# Patient Record
Sex: Female | Born: 1935 | Race: White | Hispanic: No | Smoking: Never smoker
Health system: Southern US, Community
[De-identification: ages and names within clinical notes are randomized; demographics above are authoritative.]

## PROBLEM LIST (undated history)

## (undated) DIAGNOSIS — M1990 Unspecified osteoarthritis, unspecified site: Secondary | ICD-10-CM

## (undated) DIAGNOSIS — G4730 Sleep apnea, unspecified: Secondary | ICD-10-CM

## (undated) DIAGNOSIS — E119 Type 2 diabetes mellitus without complications: Secondary | ICD-10-CM

## (undated) DIAGNOSIS — I10 Essential (primary) hypertension: Secondary | ICD-10-CM

## (undated) HISTORY — PX: ABDOMINAL HYSTERECTOMY: SHX81

## (undated) HISTORY — PX: DILATION AND CURETTAGE OF UTERUS: SHX78

## (undated) HISTORY — PX: MASTECTOMY: SHX3

## (undated) HISTORY — PX: CHOLECYSTECTOMY: SHX55

## (undated) HISTORY — PX: REPLACEMENT TOTAL KNEE BILATERAL: SUR1225

## (undated) HISTORY — PX: APPENDECTOMY: SHX54

## (undated) HISTORY — PX: CARDIAC CATHETERIZATION: SHX172

---

## 1998-10-07 ENCOUNTER — Other Ambulatory Visit: Admission: RE | Admit: 1998-10-07 | Discharge: 1998-10-07 | Payer: Self-pay | Admitting: Family Medicine

## 2004-02-06 ENCOUNTER — Other Ambulatory Visit: Payer: Self-pay

## 2004-02-06 ENCOUNTER — Inpatient Hospital Stay: Payer: Self-pay | Admitting: Internal Medicine

## 2004-02-27 ENCOUNTER — Inpatient Hospital Stay: Payer: Self-pay | Admitting: Internal Medicine

## 2004-03-14 ENCOUNTER — Ambulatory Visit: Payer: Self-pay | Admitting: Internal Medicine

## 2005-04-13 ENCOUNTER — Other Ambulatory Visit: Payer: Self-pay

## 2005-04-13 ENCOUNTER — Emergency Department: Payer: Self-pay | Admitting: Emergency Medicine

## 2005-04-29 ENCOUNTER — Ambulatory Visit: Payer: Self-pay | Admitting: Internal Medicine

## 2005-05-02 ENCOUNTER — Emergency Department: Payer: Self-pay | Admitting: Emergency Medicine

## 2005-06-29 ENCOUNTER — Emergency Department: Payer: Self-pay | Admitting: Internal Medicine

## 2005-11-11 ENCOUNTER — Ambulatory Visit: Payer: Self-pay | Admitting: Family

## 2006-05-11 ENCOUNTER — Ambulatory Visit: Payer: Self-pay | Admitting: Neurology

## 2006-10-12 DIAGNOSIS — I10 Essential (primary) hypertension: Secondary | ICD-10-CM | POA: Insufficient documentation

## 2006-10-18 ENCOUNTER — Ambulatory Visit: Payer: Self-pay | Admitting: Gastroenterology

## 2006-12-22 ENCOUNTER — Ambulatory Visit: Payer: Self-pay | Admitting: Unknown Physician Specialty

## 2008-01-09 ENCOUNTER — Ambulatory Visit: Payer: Self-pay | Admitting: Gastroenterology

## 2008-01-11 ENCOUNTER — Other Ambulatory Visit: Payer: Self-pay

## 2008-01-11 ENCOUNTER — Emergency Department: Payer: Self-pay | Admitting: Emergency Medicine

## 2008-03-19 ENCOUNTER — Ambulatory Visit: Payer: Self-pay | Admitting: Internal Medicine

## 2008-05-17 ENCOUNTER — Ambulatory Visit: Payer: Self-pay | Admitting: Cardiovascular Disease

## 2009-05-21 ENCOUNTER — Ambulatory Visit: Payer: Self-pay | Admitting: Internal Medicine

## 2010-05-26 ENCOUNTER — Ambulatory Visit: Payer: Self-pay | Admitting: Internal Medicine

## 2011-05-27 ENCOUNTER — Ambulatory Visit: Payer: Self-pay | Admitting: Internal Medicine

## 2011-06-24 ENCOUNTER — Ambulatory Visit: Payer: Self-pay | Admitting: Internal Medicine

## 2012-08-31 DIAGNOSIS — M1990 Unspecified osteoarthritis, unspecified site: Secondary | ICD-10-CM | POA: Insufficient documentation

## 2012-08-31 DIAGNOSIS — M797 Fibromyalgia: Secondary | ICD-10-CM | POA: Insufficient documentation

## 2012-09-30 DIAGNOSIS — F1120 Opioid dependence, uncomplicated: Secondary | ICD-10-CM | POA: Insufficient documentation

## 2012-09-30 DIAGNOSIS — M25512 Pain in left shoulder: Secondary | ICD-10-CM | POA: Insufficient documentation

## 2012-09-30 DIAGNOSIS — M7550 Bursitis of unspecified shoulder: Secondary | ICD-10-CM | POA: Insufficient documentation

## 2012-09-30 DIAGNOSIS — I10 Essential (primary) hypertension: Secondary | ICD-10-CM | POA: Insufficient documentation

## 2012-10-18 ENCOUNTER — Ambulatory Visit: Payer: Self-pay | Admitting: Internal Medicine

## 2012-12-30 ENCOUNTER — Emergency Department: Payer: Self-pay | Admitting: Emergency Medicine

## 2012-12-30 LAB — COMPREHENSIVE METABOLIC PANEL
Alkaline Phosphatase: 62 U/L (ref 50–136)
Anion Gap: 4 — ABNORMAL LOW (ref 7–16)
BUN: 15 mg/dL (ref 7–18)
Bilirubin,Total: 0.4 mg/dL (ref 0.2–1.0)
Calcium, Total: 9.3 mg/dL (ref 8.5–10.1)
Chloride: 101 mmol/L (ref 98–107)
Co2: 31 mmol/L (ref 21–32)
Glucose: 88 mg/dL (ref 65–99)
SGOT(AST): 31 U/L (ref 15–37)
Sodium: 136 mmol/L (ref 136–145)
Total Protein: 7.4 g/dL (ref 6.4–8.2)

## 2012-12-30 LAB — CBC
HCT: 35.8 % (ref 35.0–47.0)
MCH: 31.7 pg (ref 26.0–34.0)
MCV: 92 fL (ref 80–100)
Platelet: 354 10*3/uL (ref 150–440)
RDW: 13.4 % (ref 11.5–14.5)
WBC: 9.5 10*3/uL (ref 3.6–11.0)

## 2012-12-30 LAB — URINALYSIS, COMPLETE
Bilirubin,UR: NEGATIVE
Glucose,UR: NEGATIVE mg/dL (ref 0–75)
Ketone: NEGATIVE
Ph: 7 (ref 4.5–8.0)
Protein: NEGATIVE

## 2012-12-30 LAB — TROPONIN I: Troponin-I: <0.02 ng/mL

## 2013-02-15 DIAGNOSIS — R6884 Jaw pain: Secondary | ICD-10-CM | POA: Insufficient documentation

## 2013-02-15 DIAGNOSIS — M7550 Bursitis of unspecified shoulder: Secondary | ICD-10-CM | POA: Insufficient documentation

## 2013-05-23 DIAGNOSIS — I2510 Atherosclerotic heart disease of native coronary artery without angina pectoris: Secondary | ICD-10-CM | POA: Diagnosis present

## 2014-04-06 ENCOUNTER — Ambulatory Visit: Payer: Self-pay | Admitting: Nurse Practitioner

## 2014-04-10 ENCOUNTER — Ambulatory Visit: Payer: Self-pay | Admitting: Nurse Practitioner

## 2014-06-02 ENCOUNTER — Emergency Department: Payer: Self-pay | Admitting: Emergency Medicine

## 2014-06-02 LAB — CBC WITH DIFFERENTIAL/PLATELET
BASOS ABS: 0.1 10*3/uL (ref 0.0–0.1)
Basophil %: 1 %
EOS ABS: 0.3 10*3/uL (ref 0.0–0.7)
Eosinophil %: 4 %
HCT: 38.2 % (ref 35.0–47.0)
HGB: 12.5 g/dL (ref 12.0–16.0)
LYMPHS ABS: 2.8 10*3/uL (ref 1.0–3.6)
Lymphocyte %: 34.1 %
MCH: 30 pg (ref 26.0–34.0)
MCHC: 32.8 g/dL (ref 32.0–36.0)
MCV: 91 fL (ref 80–100)
MONOS PCT: 6.4 %
Monocyte #: 0.5 x10 3/mm (ref 0.2–0.9)
Neutrophil #: 4.4 10*3/uL (ref 1.4–6.5)
Neutrophil %: 54.5 %
PLATELETS: 352 10*3/uL (ref 150–440)
RBC: 4.18 10*6/uL (ref 3.80–5.20)
RDW: 13.1 % (ref 11.5–14.5)
WBC: 8.1 10*3/uL (ref 3.6–11.0)

## 2014-06-02 LAB — URINALYSIS, COMPLETE
Bilirubin,UR: NEGATIVE
Blood: NEGATIVE
Glucose,UR: NEGATIVE mg/dL (ref 0–75)
KETONE: NEGATIVE
LEUKOCYTE ESTERASE: NEGATIVE
Nitrite: NEGATIVE
PH: 7 (ref 4.5–8.0)
Protein: NEGATIVE
RBC,UR: NONE SEEN /HPF (ref 0–5)
SPECIFIC GRAVITY: 1.015 (ref 1.003–1.030)
WBC UR: NONE SEEN /HPF (ref 0–5)

## 2014-06-02 LAB — HEPATIC FUNCTION PANEL A (ARMC)
ALBUMIN: 3.7 g/dL (ref 3.4–5.0)
ALK PHOS: 47 U/L (ref 46–116)
AST: 41 U/L — AB (ref 15–37)
Bilirubin, Direct: <0.1 mg/dL (ref 0.0–0.2)
Bilirubin,Total: 0.5 mg/dL (ref 0.2–1.0)
SGPT (ALT): 28 U/L (ref 14–63)
Total Protein: 7.4 g/dL (ref 6.4–8.2)

## 2014-06-02 LAB — BASIC METABOLIC PANEL
ANION GAP: 10 (ref 7–16)
BUN: 17 mg/dL (ref 7–18)
CALCIUM: 8.8 mg/dL (ref 8.5–10.1)
CO2: 27 mmol/L (ref 21–32)
Chloride: 103 mmol/L (ref 98–107)
Creatinine: 1.12 mg/dL (ref 0.60–1.30)
EGFR (African American): >60
EGFR (Non-African Amer.): 50 — ABNORMAL LOW
Glucose: 97 mg/dL (ref 65–99)
Osmolality: 281 (ref 275–301)
Potassium: 4.1 mmol/L (ref 3.5–5.1)
Sodium: 140 mmol/L (ref 136–145)

## 2014-06-02 LAB — TROPONIN I

## 2014-06-03 LAB — CULTURE, BLOOD (SINGLE)

## 2014-06-04 LAB — URINE CULTURE

## 2014-08-14 ENCOUNTER — Ambulatory Visit
Admit: 2014-08-14 | Disposition: A | Discharge status: Home / Self Care | Payer: Self-pay | Attending: Gastroenterology | Admitting: Gastroenterology

## 2015-03-16 ENCOUNTER — Encounter: Payer: Self-pay | Admitting: Emergency Medicine

## 2015-03-16 ENCOUNTER — Inpatient Hospital Stay
Admission: EM | Admit: 2015-03-16 | Discharge: 2015-03-18 | DRG: 871 | Disposition: A | Discharge status: Home / Self Care | Payer: Medicare Other | Attending: Internal Medicine | Admitting: Internal Medicine

## 2015-03-16 ENCOUNTER — Emergency Department: Payer: Medicare Other

## 2015-03-16 DIAGNOSIS — Z91041 Radiographic dye allergy status: Secondary | ICD-10-CM

## 2015-03-16 DIAGNOSIS — N182 Chronic kidney disease, stage 2 (mild): Secondary | ICD-10-CM | POA: Diagnosis present

## 2015-03-16 DIAGNOSIS — Z7984 Long term (current) use of oral hypoglycemic drugs: Secondary | ICD-10-CM

## 2015-03-16 DIAGNOSIS — M1990 Unspecified osteoarthritis, unspecified site: Secondary | ICD-10-CM | POA: Diagnosis present

## 2015-03-16 DIAGNOSIS — J690 Pneumonitis due to inhalation of food and vomit: Secondary | ICD-10-CM | POA: Diagnosis present

## 2015-03-16 DIAGNOSIS — Z79891 Long term (current) use of opiate analgesic: Secondary | ICD-10-CM | POA: Diagnosis not present

## 2015-03-16 DIAGNOSIS — Z882 Allergy status to sulfonamides status: Secondary | ICD-10-CM | POA: Diagnosis not present

## 2015-03-16 DIAGNOSIS — M069 Rheumatoid arthritis, unspecified: Secondary | ICD-10-CM | POA: Diagnosis present

## 2015-03-16 DIAGNOSIS — Z79899 Other long term (current) drug therapy: Secondary | ICD-10-CM

## 2015-03-16 DIAGNOSIS — R109 Unspecified abdominal pain: Secondary | ICD-10-CM

## 2015-03-16 DIAGNOSIS — E785 Hyperlipidemia, unspecified: Secondary | ICD-10-CM | POA: Diagnosis present

## 2015-03-16 DIAGNOSIS — I129 Hypertensive chronic kidney disease with stage 1 through stage 4 chronic kidney disease, or unspecified chronic kidney disease: Secondary | ICD-10-CM | POA: Diagnosis present

## 2015-03-16 DIAGNOSIS — K219 Gastro-esophageal reflux disease without esophagitis: Secondary | ICD-10-CM | POA: Diagnosis present

## 2015-03-16 DIAGNOSIS — F419 Anxiety disorder, unspecified: Secondary | ICD-10-CM | POA: Diagnosis present

## 2015-03-16 DIAGNOSIS — R509 Fever, unspecified: Secondary | ICD-10-CM

## 2015-03-16 DIAGNOSIS — E119 Type 2 diabetes mellitus without complications: Secondary | ICD-10-CM | POA: Diagnosis present

## 2015-03-16 DIAGNOSIS — A419 Sepsis, unspecified organism: Principal | ICD-10-CM

## 2015-03-16 HISTORY — DX: Unspecified osteoarthritis, unspecified site: M19.90

## 2015-03-16 HISTORY — DX: Type 2 diabetes mellitus without complications: E11.9

## 2015-03-16 HISTORY — DX: Essential (primary) hypertension: I10

## 2015-03-16 LAB — URINALYSIS COMPLETE WITH MICROSCOPIC (ARMC ONLY)
Bacteria, UA: NONE SEEN
Bilirubin Urine: NEGATIVE
GLUCOSE, UA: NEGATIVE mg/dL
Ketones, ur: NEGATIVE mg/dL
Leukocytes, UA: NEGATIVE
Nitrite: NEGATIVE
Protein, ur: NEGATIVE mg/dL
SPECIFIC GRAVITY, URINE: 1.01 (ref 1.005–1.030)
pH: 6 (ref 5.0–8.0)

## 2015-03-16 LAB — COMPREHENSIVE METABOLIC PANEL
ALBUMIN: 4 g/dL (ref 3.5–5.0)
ALT: 41 U/L (ref 14–54)
AST: 56 U/L — AB (ref 15–41)
Alkaline Phosphatase: 70 U/L (ref 38–126)
Anion gap: 11 (ref 5–15)
BILIRUBIN TOTAL: 0.8 mg/dL (ref 0.3–1.2)
BUN: 9 mg/dL (ref 6–20)
CO2: 24 mmol/L (ref 22–32)
CREATININE: 0.97 mg/dL (ref 0.44–1.00)
Calcium: 9.3 mg/dL (ref 8.9–10.3)
Chloride: 100 mmol/L — ABNORMAL LOW (ref 101–111)
GFR calc Af Amer: >60 mL/min (ref >60)
GFR, EST NON AFRICAN AMERICAN: 55 mL/min — AB (ref >60)
GLUCOSE: 145 mg/dL — AB (ref 65–99)
Potassium: 3.6 mmol/L (ref 3.5–5.1)
Sodium: 135 mmol/L (ref 135–145)
TOTAL PROTEIN: 7.4 g/dL (ref 6.5–8.1)

## 2015-03-16 LAB — CBC
HCT: 39.8 % (ref 35.0–47.0)
HEMOGLOBIN: 13.2 g/dL (ref 12.0–16.0)
MCH: 29.8 pg (ref 26.0–34.0)
MCHC: 33.2 g/dL (ref 32.0–36.0)
MCV: 89.7 fL (ref 80.0–100.0)
PLATELETS: 390 10*3/uL (ref 150–440)
RBC: 4.43 MIL/uL (ref 3.80–5.20)
RDW: 13.3 % (ref 11.5–14.5)
WBC: 14.3 10*3/uL — ABNORMAL HIGH (ref 3.6–11.0)

## 2015-03-16 LAB — LACTIC ACID, PLASMA: Lactic Acid, Venous: 2.6 mmol/L (ref 0.5–2.0)

## 2015-03-16 MED ORDER — DEXTROSE 5 % IV SOLN
2.0000 g | Freq: Once | INTRAVENOUS | Status: AC
  Administered 2015-03-16: 2 g via INTRAVENOUS
  Filled 2015-03-16: qty 2

## 2015-03-16 MED ORDER — SODIUM CHLORIDE 0.9 % IV BOLUS (SEPSIS)
1000.0000 mL | INTRAVENOUS | Status: AC
  Administered 2015-03-16 (×2): 1000 mL via INTRAVENOUS

## 2015-03-16 MED ORDER — VANCOMYCIN HCL IN DEXTROSE 1-5 GM/200ML-% IV SOLN
1000.0000 mg | Freq: Once | INTRAVENOUS | Status: AC
  Administered 2015-03-16: 1000 mg via INTRAVENOUS
  Filled 2015-03-16: qty 200

## 2015-03-16 MED ORDER — PIPERACILLIN-TAZOBACTAM 3.375 G IVPB
3.3750 g | Freq: Once | INTRAVENOUS | Status: AC
  Administered 2015-03-16: 3.375 g via INTRAVENOUS
  Filled 2015-03-16: qty 50

## 2015-03-16 MED ORDER — SODIUM CHLORIDE 0.9 % IV BOLUS (SEPSIS): 1000.0000 mL | Freq: Once | INTRAVENOUS | Status: AC

## 2015-03-16 MED ORDER — ACETAMINOPHEN 500 MG PO TABS
1000.0000 mg | ORAL_TABLET | Freq: Once | ORAL | Status: AC
  Administered 2015-03-16: 1000 mg via ORAL
  Filled 2015-03-16: qty 2

## 2015-03-16 MED ORDER — SODIUM CHLORIDE 0.9 % IV BOLUS (SEPSIS)
500.0000 mL | INTRAVENOUS | Status: AC
  Administered 2015-03-16: 500 mL via INTRAVENOUS

## 2015-03-16 MED ORDER — DEXTROSE 5 % IV SOLN: 1.0000 g | Freq: Once | INTRAVENOUS | Status: DC

## 2015-03-16 MED ORDER — DEXTROSE 5 % IV SOLN: 2.0000 g | INTRAVENOUS | Status: DC

## 2015-03-16 NOTE — ED Provider Notes (Signed)
[facility] Emergency Department Provider Note  Time seen: 9:08 PM  I have reviewed the triage vital signs and the nursing notes.   HISTORY  Chief Complaint No chief complaint on file.    HPI Hayley Knight is a 79 y.o. female with a past medical history of chronic renal insufficiency stage II, essential hypertension, gastric reflux, diabetes, anxiety, rheumatoid arthritis, who presents the emergency department referred by her nephrologist. According to the patient for the past one month she has had a urinary tract infection which has been resistant to oral antibiotics. Patient continues to have fevers including 102.3 tonight per patient. She called her nephrologist Dr. Holley Raring who recommended the patient come to the emergency department for IV antibiotics and admission to the hospital. Patient denies any abdominal pain, nausea, vomiting, diarrhea. Denies any dysuria or hematuria, but notes darker urine recently. Patient states she continues to have fevers despite antibiotics for the past one month.    No past medical history on file.  There are no active problems to display for this patient.   No past surgical history on file.  No current outpatient prescriptions on file.  Allergies Review of patient's allergies indicates not on file.  No family history on file.  Social History Social History  Substance Use Topics  . Smoking status: Not on file  . Smokeless tobacco: Not on file  . Alcohol Use: Not on file    Review of Systems Constitutional: Positive for fever to 102 per patient. ENT: Negative for congestion Cardiovascular: Negative for chest pain. Respiratory: Negative for shortness of breath. Denies cough. Gastrointestinal: Negative for abdominal pain, vomiting and diarrhea. Genitourinary: Negative for dysuria. Musculoskeletal: Negative for back pain. Neurological: Negative for headache 10-point ROS otherwise  negative.  ____________________________________________   PHYSICAL EXAM:  Constitutional: Alert and oriented. Well appearing and in no distress. Eyes: Normal exam ENT   Head: Normocephalic and atraumatic   Mouth/Throat: Mucous membranes are moist. Cardiovascular: Normal rate, regular rhythm. No murmur Respiratory: Normal respiratory effort without tachypnea nor retractions. Breath sounds are clear Gastrointestinal: Soft and nontender. No distention.  There is no CVA tenderness. Musculoskeletal: Nontender with normal range of motion in all extremities. Neurologic:  Normal speech and language. No gross focal neurologic deficits  Skin:  Skin is warm, dry and intact.  Psychiatric: Mood and affect are normal. Speech and behavior are normal.  ____________________________________________    INITIAL IMPRESSION / ASSESSMENT AND PLAN / ED COURSE  Pertinent labs & imaging results that were available during my care of the patient were reviewed by me and considered in my medical decision making (see chart for details).  Overall very well-appearing patient with no complaints such as pain or dysuria. Patient states she continues to have fevers to 102 at home despite one month of antibiotics in several different regimens. Patient called her nephrologist this evening due to her fever of 102 who recommended she come to the emergency department for evaluation, IV antibiotics and admission to the hospital. We will check labs, cultures, and closely monitor in the emergency department. Once labs are resulted we will discuss with nephrology for disposition.   Vitals show patient is tachycardic around 105 bpm, febrile to 102 in the emergency department. Code sepsis has been initiated.  EKG reviewed and interpreted by myself shows sinus tachycardia 104 bpm, narrow QRS, normal axis, normal intervals. Nonspecific ST changes present. No ST elevations.  Labs show an elevated white blood cell count, as  well as an elevated  lactic acid. Patient's urinalysis does not appear to show any white blood cells, and no bacteria. Given no definitive urinary tract infection we will broaden the patient's antibiotics until definitive source is identified.  CRITICAL CARE Performed by: Harvest Dark   Total critical care time: 30 minutes  Critical care time was exclusive of separately billable procedures and treating other patients.  Critical care was necessary to treat or prevent imminent or life-threatening deterioration.  Critical care was time spent personally by me on the following activities: development of treatment plan with patient and/or surrogate as well as nursing, discussions with consultants, evaluation of patient's response to treatment, examination of patient, obtaining history from patient or surrogate, ordering and performing treatments and interventions, ordering and review of laboratory studies, ordering and review of radiographic studies, pulse oximetry and re-evaluation of patient's condition.   ____________________________________________   FINAL CLINICAL IMPRESSION(S) / ED DIAGNOSES  Fever Septic   Harvest Dark, MD 03/16/15 [phone]

## 2015-03-16 NOTE — ED Notes (Signed)
Pt to rm 19 via EMS.  EMS report pt c/o UTI and wants to be admitted for antibiotics.  Pt febrile w/ 102.2.  Pt NAD at this time.

## 2015-03-16 NOTE — Progress Notes (Signed)
ANTIBIOTIC CONSULT NOTE - INITIAL  Pharmacy Consult for ceftriaxone dosing Indication: UTI  Allergies  Allergen Reactions  . Ivp Dye [Iodinated Diagnostic Agents] Swelling  . Sulfa Antibiotics Swelling    Patient Measurements: Height: 4\' 11"  (149.9 cm) Weight: 170 lb (77.111 kg) IBW/kg (Calculated) : 43.2 Adjusted Body Weight: n/a  Vital Signs: Temp: 102.2 F (39 C) (11/19 2112) Temp Source: Oral (11/19 2112) BP: 136/89 mmHg (11/19 2112) Pulse Rate: 110 (11/19 2112) Intake/Output from previous day:   Intake/Output from this shift:    Labs:  Recent Labs  03/16/15 2114  WBC 14.3*  HGB 13.2  PLT 390  CREATININE 0.97   Estimated Creatinine Clearance: 43.6 mL/min (by C-G formula based on Cr of 0.97). No results for input(s): VANCOTROUGH, VANCOPEAK, VANCORANDOM, GENTTROUGH, GENTPEAK, GENTRANDOM, TOBRATROUGH, TOBRAPEAK, TOBRARND, AMIKACINPEAK, AMIKACINTROU, AMIKACIN in the last 72 hours.   Microbiology: No results found for this or any previous visit (from the past 720 hour(s)).  Medical History: Past Medical History  Diagnosis Date  . Arthritis   . Diabetes mellitus without complication ([place])   . Hypertension     Medications:   Assessment: Blood and urine cx pending UA: pending  Goal of Therapy:  Resolution of infection  Plan:  Ceftriaxone 2 grams q 24 hours ordered.  Eliakim Tendler S 03/16/2015,9:51 PM

## 2015-03-16 NOTE — ED Notes (Signed)
Pt's medications taken to pharmacy by pharm tech

## 2015-03-17 ENCOUNTER — Inpatient Hospital Stay: Payer: Medicare Other

## 2015-03-17 LAB — LACTIC ACID, PLASMA: LACTIC ACID, VENOUS: 1.8 mmol/L (ref 0.5–2.0)

## 2015-03-17 LAB — GLUCOSE, CAPILLARY
Glucose-Capillary: 97 mg/dL (ref 65–99)
Glucose-Capillary: 91 mg/dL (ref 65–99)
Glucose-Capillary: 108 mg/dL — ABNORMAL HIGH (ref 65–99)
Glucose-Capillary: 101 mg/dL — ABNORMAL HIGH (ref 65–99)
Glucose-Capillary: 120 mg/dL — ABNORMAL HIGH (ref 65–99)

## 2015-03-17 LAB — HEMOGLOBIN A1C: Hgb A1c MFr Bld: 5.7 % (ref 4.0–6.0)

## 2015-03-17 LAB — TSH: TSH: 2.275 u[IU]/mL (ref 0.350–4.500)

## 2015-03-17 MED ORDER — INSULIN ASPART 100 UNIT/ML ~~LOC~~ SOLN: 0.0000 [IU] | Freq: Three times a day (TID) | SUBCUTANEOUS | Status: DC

## 2015-03-17 MED ORDER — PANTOPRAZOLE SODIUM 40 MG PO TBEC
40.0000 mg | DELAYED_RELEASE_TABLET | Freq: Every day | ORAL | Status: DC
  Administered 2015-03-17 – 2015-03-18 (×2): 40 mg via ORAL
  Filled 2015-03-17 (×2): qty 1

## 2015-03-17 MED ORDER — INSULIN ASPART 100 UNIT/ML ~~LOC~~ SOLN: 0.0000 [IU] | Freq: Every day | SUBCUTANEOUS | Status: DC

## 2015-03-17 MED ORDER — SODIUM CHLORIDE 0.9 % IV BOLUS (SEPSIS)
500.0000 mL | Freq: Once | INTRAVENOUS | Status: AC
  Administered 2015-03-17: 500 mL via INTRAVENOUS

## 2015-03-17 MED ORDER — OMEGA-3-ACID ETHYL ESTERS 1 G PO CAPS
2.0000 g | ORAL_CAPSULE | Freq: Two times a day (BID) | ORAL | Status: DC
  Administered 2015-03-17 – 2015-03-18 (×3): 2 g via ORAL
  Filled 2015-03-17 (×3): qty 2

## 2015-03-17 MED ORDER — MORPHINE SULFATE (PF) 2 MG/ML IV SOLN
1.0000 mg | INTRAVENOUS | Status: DC | PRN
  Administered 2015-03-17: 1 mg via INTRAVENOUS
  Filled 2015-03-17: qty 1

## 2015-03-17 MED ORDER — ONDANSETRON HCL 4 MG PO TABS: 4.0000 mg | ORAL_TABLET | Freq: Four times a day (QID) | ORAL | Status: DC | PRN

## 2015-03-17 MED ORDER — ESCITALOPRAM OXALATE 10 MG PO TABS
20.0000 mg | ORAL_TABLET | Freq: Every day | ORAL | Status: DC
  Administered 2015-03-17 – 2015-03-18 (×2): 20 mg via ORAL
  Filled 2015-03-17 (×2): qty 2

## 2015-03-17 MED ORDER — OXYCODONE HCL ER 10 MG PO T12A
10.0000 mg | EXTENDED_RELEASE_TABLET | Freq: Two times a day (BID) | ORAL | Status: DC
  Administered 2015-03-17 – 2015-03-18 (×3): 10 mg via ORAL
  Filled 2015-03-17 (×3): qty 1

## 2015-03-17 MED ORDER — FUROSEMIDE 20 MG PO TABS: 20.0000 mg | ORAL_TABLET | ORAL | Status: DC

## 2015-03-17 MED ORDER — EZETIMIBE 10 MG PO TABS
10.0000 mg | ORAL_TABLET | Freq: Every day | ORAL | Status: DC
  Administered 2015-03-17 – 2015-03-18 (×2): 10 mg via ORAL
  Filled 2015-03-17 (×2): qty 1

## 2015-03-17 MED ORDER — VANCOMYCIN HCL IN DEXTROSE 750-5 MG/150ML-% IV SOLN
750.0000 mg | INTRAVENOUS | Status: DC
  Filled 2015-03-17: qty 150

## 2015-03-17 MED ORDER — FENOFIBRATE 145 MG PO TABS
145.0000 mg | ORAL_TABLET | Freq: Every day | ORAL | Status: DC
  Administered 2015-03-17: 145 mg via ORAL
  Filled 2015-03-17 (×3): qty 1

## 2015-03-17 MED ORDER — FENOFIBRATE 160 MG PO TABS: 160.0000 mg | ORAL_TABLET | Freq: Every day | ORAL | Status: DC

## 2015-03-17 MED ORDER — SODIUM CHLORIDE 0.9 % IJ SOLN
3.0000 mL | Freq: Two times a day (BID) | INTRAMUSCULAR | Status: DC
  Administered 2015-03-17 – 2015-03-18 (×3): 3 mL via INTRAVENOUS

## 2015-03-17 MED ORDER — VALSARTAN-HYDROCHLOROTHIAZIDE 160-12.5 MG PO TABS: 1.0000 | ORAL_TABLET | Freq: Every day | ORAL | Status: DC

## 2015-03-17 MED ORDER — ALPRAZOLAM 0.5 MG PO TABS: 0.5000 mg | ORAL_TABLET | Freq: Two times a day (BID) | ORAL | Status: DC | PRN

## 2015-03-17 MED ORDER — PIPERACILLIN-TAZOBACTAM 4.5 G IVPB
4.5000 g | Freq: Three times a day (TID) | INTRAVENOUS | Status: DC
  Administered 2015-03-17 – 2015-03-18 (×5): 4.5 g via INTRAVENOUS
  Filled 2015-03-17 (×6): qty 100

## 2015-03-17 MED ORDER — HEPARIN SODIUM (PORCINE) 5000 UNIT/ML IJ SOLN
5000.0000 [IU] | Freq: Three times a day (TID) | INTRAMUSCULAR | Status: DC
  Administered 2015-03-17 – 2015-03-18 (×5): 5000 [IU] via SUBCUTANEOUS
  Filled 2015-03-17 (×5): qty 1

## 2015-03-17 MED ORDER — DOCUSATE SODIUM 100 MG PO CAPS
100.0000 mg | ORAL_CAPSULE | Freq: Two times a day (BID) | ORAL | Status: DC
  Administered 2015-03-18: 100 mg via ORAL
  Filled 2015-03-17 (×2): qty 1

## 2015-03-17 MED ORDER — SODIUM CHLORIDE 0.9 % IV BOLUS (SEPSIS)
1000.0000 mL | Freq: Once | INTRAVENOUS | Status: AC
  Administered 2015-03-17: 1000 mL via INTRAVENOUS

## 2015-03-17 MED ORDER — ONDANSETRON HCL 4 MG/2ML IJ SOLN: 4.0000 mg | Freq: Four times a day (QID) | INTRAMUSCULAR | Status: DC | PRN

## 2015-03-17 MED ORDER — FLUCONAZOLE 50 MG PO TABS
150.0000 mg | ORAL_TABLET | Freq: Once | ORAL | Status: AC
  Administered 2015-03-17: 150 mg via ORAL
  Filled 2015-03-17: qty 1

## 2015-03-17 MED ORDER — SODIUM CHLORIDE 0.9 % IV SOLN
INTRAVENOUS | Status: DC
  Administered 2015-03-17: 04:00:00 via INTRAVENOUS

## 2015-03-17 MED ORDER — ACETAMINOPHEN 650 MG RE SUPP: 650.0000 mg | Freq: Four times a day (QID) | RECTAL | Status: DC | PRN

## 2015-03-17 MED ORDER — COLESEVELAM HCL 625 MG PO TABS
625.0000 mg | ORAL_TABLET | Freq: Two times a day (BID) | ORAL | Status: DC
  Administered 2015-03-17 – 2015-03-18 (×4): 625 mg via ORAL
  Filled 2015-03-17 (×6): qty 1

## 2015-03-17 MED ORDER — ACETAMINOPHEN 325 MG PO TABS
650.0000 mg | ORAL_TABLET | Freq: Four times a day (QID) | ORAL | Status: DC | PRN
  Administered 2015-03-18: 650 mg via ORAL
  Filled 2015-03-17: qty 2

## 2015-03-17 MED ORDER — CYCLOBENZAPRINE HCL 10 MG PO TABS: 10.0000 mg | ORAL_TABLET | Freq: Three times a day (TID) | ORAL | Status: DC | PRN

## 2015-03-17 NOTE — Progress Notes (Signed)
Called Dr. Marcille Blanco regarding patient's blood pressure 80/50.  Doctor ordered a bolus of normal saline.  Return to 148ml/hr.  Christene Slates  03/17/2015  7:14 AM

## 2015-03-17 NOTE — H&P (Addendum)
Hayley Knight is an 79 y.o. female.   Chief Complaint: Fever HPI: The patient presents emergency department complaining of fever nearly 103F. She denies chills, sweats, nausea or vomiting. She has been dealing with recurrent urinary tract infections. She had been prescribed Cipro for 7 days without complete relief of urinary symptoms. She returned to her nephrologist who prescribed another course of antibiotics that helped relieve her urinary symptoms. However, today she developed fever and called her nephrologist who recommended that she be evaluated in the emergency department. Urinalysis shows no urinary tract infection, however her vital signs and leukocytosis as well as lactic acid indicates that she may have bacteremia and sepsis which prompted the emergency department staff to call for admission.  Past Medical History  Diagnosis Date  . Arthritis   . Diabetes mellitus without complication ([facility])   . Hypertension     History reviewed. No pertinent past surgical history.  History reviewed. No pertinent family history. Social History:  reports that she has never smoked. She does not have any smokeless tobacco history on file. She reports that she does not drink alcohol or use illicit drugs.  Allergies:  Allergies  Allergen Reactions  . Ivp Dye [Iodinated Diagnostic Agents] Swelling  . Sulfa Antibiotics Swelling    Prior to Admission medications   Medication Sig Start Date End Date Taking? Authorizing Provider  ALPRAZolam Prudy Feeler) 0.5 MG tablet Take 0.5 mg by mouth 2 (two) times daily as needed for anxiety.   Yes Historical Provider, MD  colesevelam (WELCHOL) 625 MG tablet Take 625 mg by mouth 2 (two) times daily. Patient will stop taking if she becomes constipated   Yes Historical Provider, MD  cyclobenzaprine (FLEXERIL) 10 MG tablet Take 10 mg by mouth 3 (three) times daily as needed for muscle spasms.   Yes Historical Provider, MD  dexlansoprazole (DEXILANT) 60 MG capsule Take 60  mg by mouth daily.   Yes Historical Provider, MD  escitalopram (LEXAPRO) 20 MG tablet Take 20 mg by mouth daily.   Yes Historical Provider, MD  ezetimibe (ZETIA) 10 MG tablet Take 10 mg by mouth daily.   Yes Historical Provider, MD  fenofibrate (TRICOR) 145 MG tablet Take 145 mg by mouth daily.   Yes Historical Provider, MD  furosemide (LASIX) 20 MG tablet Take 20 mg by mouth as directed. Take once weekly as needed for fluid retention   Yes Historical Provider, MD  metFORMIN (GLUCOPHAGE) 500 MG tablet Take 500 mg by mouth daily with breakfast.   Yes Historical Provider, MD  omega-3 acid ethyl esters (LOVAZA) 1 G capsule Take 2 g by mouth 2 (two) times daily.   Yes Historical Provider, MD  oxyCODONE (OXYCONTIN) 10 mg 12 hr tablet Take 10 mg by mouth every 12 (twelve) hours.   Yes Historical Provider, MD  valsartan-hydrochlorothiazide (DIOVAN-HCT) 160-12.5 MG tablet Take 1 tablet by mouth daily.   Yes Historical Provider, MD     Results for orders placed or performed during the hospital encounter of 03/16/15 (from the past 48 hour(s))  CBC     Status: Abnormal   Collection Time: 03/16/15  9:14 PM  Result Value Ref Range   WBC 14.3 (H) 3.6 - 11.0 K/uL   RBC 4.43 3.80 - 5.20 MIL/uL   Hemoglobin 13.2 12.0 - 16.0 g/dL   HCT 82.3 10.4 - 02.5 %   MCV 89.7 80.0 - 100.0 fL   MCH 29.8 26.0 - 34.0 pg   MCHC 33.2 32.0 - 36.0 g/dL  RDW 13.3 11.5 - 14.5 %   Platelets 390 150 - 440 K/uL  Comprehensive metabolic panel     Status: Abnormal   Collection Time: 03/16/15  9:14 PM  Result Value Ref Range   Sodium 135 135 - 145 mmol/L   Potassium 3.6 3.5 - 5.1 mmol/L   Chloride 100 (L) 101 - 111 mmol/L   CO2 24 22 - 32 mmol/L   Glucose, Bld 145 (H) 65 - 99 mg/dL   BUN 9 6 - 20 mg/dL   Creatinine, Ser 0.97 0.44 - 1.00 mg/dL   Calcium 9.3 8.9 - 10.3 mg/dL   Total Protein 7.4 6.5 - 8.1 g/dL   Albumin 4.0 3.5 - 5.0 g/dL   AST 56 (H) 15 - 41 U/L   ALT 41 14 - 54 U/L   Alkaline Phosphatase 70 38 - 126 U/L    Total Bilirubin 0.8 0.3 - 1.2 mg/dL   GFR calc non Af Amer 55 (L) >60 mL/min   GFR calc Af Amer >60 >60 mL/min    Comment: (NOTE) The eGFR has been calculated using the CKD EPI equation. This calculation has not been validated in all clinical situations. eGFR's persistently <60 mL/min signify possible Chronic Kidney Disease.    Anion gap 11 5 - 15  Lactic acid, plasma     Status: Abnormal   Collection Time: 03/16/15  9:14 PM  Result Value Ref Range   Lactic Acid, Venous 2.6 (HH) 0.5 - 2.0 mmol/L    Comment: CRITICAL RESULT CALLED TO, READ BACK BY AND VERIFIED  CHRISTINE KIM AT 2200 03/16/15 SDR   Urinalysis complete, with microscopic ([facility] only)     Status: Abnormal   Collection Time: 03/16/15  9:33 PM  Result Value Ref Range   Color, Urine YELLOW (A) YELLOW   APPearance CLEAR (A) CLEAR   Glucose, UA NEGATIVE NEGATIVE mg/dL   Bilirubin Urine NEGATIVE NEGATIVE   Ketones, ur NEGATIVE NEGATIVE mg/dL   Specific Gravity, Urine 1.010 1.005 - 1.030   Hgb urine dipstick 2+ (A) NEGATIVE   pH 6.0 5.0 - 8.0   Protein, ur NEGATIVE NEGATIVE mg/dL   Nitrite NEGATIVE NEGATIVE   Leukocytes, UA NEGATIVE NEGATIVE   RBC / HPF 6-30 0 - 5 RBC/hpf   WBC, UA 0-5 0 - 5 WBC/hpf   Bacteria, UA NONE SEEN NONE SEEN   Squamous Epithelial / LPF 0-5 (A) NONE SEEN   Dg Chest Portable 1 View  03/16/2015  CLINICAL DATA:  Kidney infection 2 months ago, not improved after antibiotics. History of diabetes, hypertension. EXAM: PORTABLE CHEST 1 VIEW COMPARISON:  Chest radiograph June 02, 2014 FINDINGS: Cardiac silhouette is mildly enlarged. Tortuous, possibly ectatic aorta is similar. Mild bronchitic changes, strandy densities LEFT lung base are unchanged. No pleural effusion or focal consolidation. No pneumothorax. Soft tissue planes and included osseous structures are nonsuspicious. IMPRESSION: Mild cardiomegaly and similar bronchitic changes. LEFT lung base atelectasis/ scarring. Electronically Signed   By:  Elon Alas M.D.   On: 03/16/2015 23:25    Review of Systems  Constitutional: Negative for fever and chills.  HENT: Negative for sore throat and tinnitus.   Eyes: Negative for blurred vision and redness.  Respiratory: Negative for cough and shortness of breath.   Cardiovascular: Negative for chest pain, palpitations, orthopnea and PND.  Gastrointestinal: Negative for nausea, vomiting, abdominal pain and diarrhea.  Genitourinary: Negative for dysuria, urgency and frequency.  Musculoskeletal: Negative for myalgias and joint pain.  Skin: Negative for rash.  No lesions  Neurological: Negative for speech change, focal weakness and weakness.  Endo/Heme/Allergies: Does not bruise/bleed easily.       No temperature intolerance  Psychiatric/Behavioral: Negative for depression and suicidal ideas.    Blood pressure 94/53, pulse 79, temperature 99.2 F (37.3 C), temperature source Oral, resp. rate 17, height $RemoveBe'4\' 11"'VYcCvpCLM$  (1.499 m), weight 77.111 kg (170 lb), SpO2 96 %. Physical Exam  Nursing note and vitals reviewed. Constitutional: She is oriented to person, place, and time. She appears well-developed and well-nourished. No distress.  HENT:  Head: Normocephalic and atraumatic.  Mouth/Throat: Oropharynx is clear and moist.  Eyes: Conjunctivae and EOM are normal. Pupils are equal, round, and reactive to light. No scleral icterus.  Neck: Normal range of motion. Neck supple. No JVD present. No tracheal deviation present. No thyromegaly present.  Cardiovascular: Normal rate, regular rhythm and normal heart sounds.  Exam reveals no gallop and no friction rub.   No murmur heard. Respiratory: Effort normal and breath sounds normal.  GI: Soft. Bowel sounds are normal. She exhibits no distension and no mass. There is no tenderness. There is no rebound and no guarding.  Genitourinary:  Odor of yeast infection  Musculoskeletal: Normal range of motion. She exhibits no edema.  Lymphadenopathy:     She has no cervical adenopathy.  Neurological: She is alert and oriented to person, place, and time. No cranial nerve deficit. She exhibits normal muscle tone.  Skin: Skin is warm and dry. No rash noted. No erythema.  Psychiatric: She has a normal mood and affect. Her behavior is normal. Judgment and thought content normal.     Assessment/Plan This is a 79 year old Caucasian female admitted for sepsis of unknown source. 1. Sepsis: The patient meets criteria via fever, intermittent tachycardia, and leukocytosis. Lactic acid is also elevated at 2.6. The urine or abdomen is presumably the source. She does have some abdominal tenderness for which I will order a CT scan. The patient is hemodynamically stable. Blood cultures and urine cultures have been obtained and broad-spectrum antibiotics started. The aggressive hydration with intravenous fluid. 2. Diabetes mellitus type 2: Hold metformin for now. Sliding scale insulin while the patient is hospitalized 3. Hypertension: Controlled; actually the patient has relatively low blood pressure but is asymptomatic. We will continue to replete volume. Hold antihypertensive medication for now 4. Hyperlipidemia: Continue WelChol, ezetimibe and fenofibrate 5. DVT prophylaxis: Heparin 6. GI prophylaxis: None The patient is a full code. Time spent on admission was inpatient care approximately 45 minutes  Harrie Foreman 03/17/2015, 12:34 AM

## 2015-03-17 NOTE — ED Notes (Signed)
Pt given 1st bottle of contrast.  Pt to drink second bottle at 0150.

## 2015-03-17 NOTE — Progress Notes (Signed)
Pt asymtomatic with BP of 85/45. MD notified. Bolus ordered.

## 2015-03-17 NOTE — Progress Notes (Signed)
ANTIBIOTIC CONSULT NOTE - INITIAL  Pharmacy Consult for vancomycin and Zosyn dosing Indication: sepsis  Allergies  Allergen Reactions  . Ivp Dye [Iodinated Diagnostic Agents] Swelling  . Sulfa Antibiotics Swelling    Patient Measurements: Height: 4\' 11"  (149.9 cm) Weight: 170 lb (77.111 kg) IBW/kg (Calculated) : 43.2 Adjusted Body Weight: 57kg  Vital Signs: Temp: 98.7 F (37.1 C) (11/20 0129) Temp Source: Oral (11/20 0129) BP: 93/43 mmHg (11/20 0129) Pulse Rate: 76 (11/20 0129) Intake/Output from previous day: 11/19 0701 - 11/20 0700 In: 2500 [I.V.:2500] Out: -  Intake/Output from this shift: Total I/O In: 2500 [I.V.:2500] Out: -   Labs:  Recent Labs  03/16/15 2114  WBC 14.3*  HGB 13.2  PLT 390  CREATININE 0.97   Estimated Creatinine Clearance: 43.6 mL/min (by C-G formula based on Cr of 0.97). No results for input(s): VANCOTROUGH, VANCOPEAK, VANCORANDOM, GENTTROUGH, GENTPEAK, GENTRANDOM, TOBRATROUGH, TOBRAPEAK, TOBRARND, AMIKACINPEAK, AMIKACINTROU, AMIKACIN in the last 72 hours.   Microbiology: No results found for this or any previous visit (from the past 720 hour(s)).  Medical History: Past Medical History  Diagnosis Date  . Arthritis   . Diabetes mellitus without complication ([place])   . Hypertension     Medications:   Assessment: Blood and urine cx pending UA: (-) CXR: no focal consolidation  Goal of Therapy:  Vancomycin trough level 15-20 mcg/ml  Plan:  Vancomycin TBW 77.1kg  IBW 43.2kg  DW 57kg  Vd 40L kei 0.041 hr-1  T1/2 17 hours 750 mg IV q 18 hours ordered with stacked dosing. Level before 5th dose.  Zosyn 4.5 grams q 8 hours ordered for Pseudomonas risk of previous abx usage.  Chandria Rookstool S 03/17/2015,1:47 AM

## 2015-03-18 LAB — CBC WITH DIFFERENTIAL/PLATELET
Basophils Absolute: 0 10*3/uL (ref 0–0.1)
EOS ABS: 0.5 10*3/uL (ref 0–0.7)
Eosinophils Relative: 8 %
HCT: 31.5 % — ABNORMAL LOW (ref 35.0–47.0)
Hemoglobin: 10.4 g/dL — ABNORMAL LOW (ref 12.0–16.0)
Lymphocytes Relative: 40 %
Lymphs Abs: 2.6 10*3/uL (ref 1.0–3.6)
MCH: 30.2 pg (ref 26.0–34.0)
MCHC: 33 g/dL (ref 32.0–36.0)
MCV: 91.4 fL (ref 80.0–100.0)
MONO ABS: 0.5 10*3/uL (ref 0.2–0.9)
Neutro Abs: 2.8 10*3/uL (ref 1.4–6.5)
Platelets: 336 10*3/uL (ref 150–440)
RBC: 3.45 MIL/uL — ABNORMAL LOW (ref 3.80–5.20)
RDW: 13.5 % (ref 11.5–14.5)
WBC: 6.4 10*3/uL (ref 3.6–11.0)

## 2015-03-18 LAB — BLOOD GAS, ARTERIAL
ACID-BASE EXCESS: 6.3 mmol/L — AB (ref 0.0–3.0)
Allens test (pass/fail): POSITIVE — AB
BICARBONATE: 29.2 meq/L — AB (ref 21.0–28.0)
FIO2: 0.21
O2 Saturation: 99.3 %
PCO2 ART: 35 mmHg (ref 32.0–48.0)
PH ART: 7.53 — AB (ref 7.350–7.450)
PO2 ART: 135 mmHg — AB (ref 83.0–108.0)
Patient temperature: 37

## 2015-03-18 LAB — GLUCOSE, CAPILLARY
Glucose-Capillary: 111 mg/dL — ABNORMAL HIGH (ref 65–99)
Glucose-Capillary: 112 mg/dL — ABNORMAL HIGH (ref 65–99)

## 2015-03-18 LAB — URINE CULTURE: Culture: NO GROWTH

## 2015-03-18 LAB — BRAIN NATRIURETIC PEPTIDE: B Natriuretic Peptide: 141 pg/mL — ABNORMAL HIGH (ref 0.0–100.0)

## 2015-03-18 MED ORDER — AMOXICILLIN-POT CLAVULANATE 875-125 MG PO TABS: 1.0000 | ORAL_TABLET | Freq: Two times a day (BID) | ORAL | Status: DC

## 2015-03-18 NOTE — Discharge Instructions (Signed)

## 2015-03-18 NOTE — Care Management Important Message (Signed)
Important Message  Patient Details  Name: Hayley Knight MRN: [id number] Date of Birth: [birth year]   Medicare Important Message Given:  Yes    Juliann Pulse A Taisei Bonnette 03/18/2015, 10:21 AM

## 2015-03-18 NOTE — Progress Notes (Signed)
Spoke with patient who is alert and oriented from home alone with family support from daughter "she helps me get what I need".  Patient stated that she drives herself to appointments. Stated that she can afford her meds but that it is difficult sometimes. No DME. Ambulates well with minimum assistance with staff around nursing station.  Anticipate discharge home to self care.

## 2015-03-18 NOTE — Progress Notes (Signed)
Pt d/c to home today.  IV removed intact.  Rx's given to pt w/all questions and concerns addressed.  D/C paperwork reviewed and education provided with all questions and concerns addressed.  Pt family member at bedside for home transport.

## 2015-03-21 LAB — CULTURE, BLOOD (ROUTINE X 2)
CULTURE: NO GROWTH
Culture: NO GROWTH

## 2015-03-25 NOTE — Discharge Summary (Signed)
[facility] [employer] NAME: Hayley Knight    MR#:  [id number]  DATE OF BIRTH:  October 26, 1935  DATE OF ADMISSION:  03/16/2015 ADMITTING PHYSICIAN: Harrie Foreman, MD  DATE OF DISCHARGE: 03/18/2015  4:16 PM  PRIMARY CARE PHYSICIAN: Dagoberto Ligas, MD    ADMISSION DIAGNOSIS:  Abdominal pain [R10.9] Sepsis, due to unspecified organism ([place]) [A41.9] Fever, unspecified fever cause [R50.9]  DISCHARGE DIAGNOSIS:  Active Problems:   Sepsis ([place])   SECONDARY DIAGNOSIS:   Past Medical History  Diagnosis Date  . Arthritis   . Diabetes mellitus without complication ([place])   . Hypertension      ADMITTING HISTORY  HPI: The patient presents emergency department complaining of fever nearly 103F. She denies chills, sweats, nausea or vomiting. She has been dealing with recurrent urinary tract infections. She had been prescribed Cipro for 7 days without complete relief of urinary symptoms. She returned to her nephrologist who prescribed another course of antibiotics that helped relieve her urinary symptoms. However, today she developed fever and called her nephrologist who recommended that she be evaluated in the emergency department. Urinalysis shows no urinary tract infection, however her vital signs and leukocytosis as well as lactic acid indicates that she may have bacteremia and sepsis which prompted the emergency department staff to call for admission.   HOSPITAL COURSE:   * Bilateral basal pneumonia, likely aspiration * GERD * Diabetes mellitus * Hypertension  Patient was admitted onto medical floor and started on IV antibiotics. Ulcers were sent which remained negative by day of discharge. Patient improved well being afebrile. White count was normal. Did not need any oxygen. Patient's pneumonia was likely from chronic slow aspiration from GERD. Aspiration precautions given. Patient changed to oral antibiotics at discharge for 1 more  week. Requested to follow-up with primary care physician in a week. Continue PPI.  Stable for discharge home.   CONSULTS OBTAINED:     DRUG ALLERGIES:   Allergies  Allergen Reactions  . Ivp Dye [Iodinated Diagnostic Agents] Swelling  . Sulfa Antibiotics Swelling    DISCHARGE MEDICATIONS:   Discharge Medication List as of 03/18/2015  2:10 PM    START taking these medications   Details  amoxicillin-clavulanate (AUGMENTIN) 875-125 MG tablet Take 1 tablet by mouth 2 (two) times daily., Starting 03/18/2015, Until Discontinued, Print      CONTINUE these medications which have NOT CHANGED   Details  ALPRAZolam (XANAX) 0.5 MG tablet Take 0.5 mg by mouth 2 (two) times daily as needed for anxiety., Until Discontinued, Historical Med    colesevelam (WELCHOL) 625 MG tablet Take 625 mg by mouth 2 (two) times daily. Patient will stop taking if she becomes constipated, Until Discontinued, Historical Med    cyclobenzaprine (FLEXERIL) 10 MG tablet Take 10 mg by mouth 3 (three) times daily as needed for muscle spasms., Until Discontinued, Historical Med    dexlansoprazole (DEXILANT) 60 MG capsule Take 60 mg by mouth daily., Until Discontinued, Historical Med    escitalopram (LEXAPRO) 20 MG tablet Take 20 mg by mouth daily., Until Discontinued, Historical Med    ezetimibe (ZETIA) 10 MG tablet Take 10 mg by mouth daily., Until Discontinued, Historical Med    fenofibrate (TRICOR) 145 MG tablet Take 145 mg by mouth daily., Until Discontinued, Historical Med    furosemide (LASIX) 20 MG tablet Take 20 mg by mouth as directed. Take once weekly as needed for fluid retention, Until Discontinued, Historical Med    metFORMIN (  GLUCOPHAGE) 500 MG tablet Take 500 mg by mouth daily with breakfast., Until Discontinued, Historical Med    omega-3 acid ethyl esters (LOVAZA) 1 G capsule Take 2 g by mouth 2 (two) times daily., Until Discontinued, Historical Med    oxyCODONE (OXYCONTIN) 10 mg 12 hr tablet  Take 10 mg by mouth every 12 (twelve) hours., Until Discontinued, Historical Med      STOP taking these medications     valsartan-hydrochlorothiazide (DIOVAN-HCT) 160-12.5 MG tablet          Today    VITAL SIGNS:  Blood pressure 124/59, pulse 77, temperature 98.7 F (37.1 C), temperature source Oral, resp. rate 16, height 4\' 11"  (1.499 m), weight 84.959 kg (187 lb 4.8 oz), SpO2 98 %.  I/O:  No intake or output data in the 24 hours ending 03/25/15 1346  PHYSICAL EXAMINATION:  Physical Exam  GENERAL:  79 y.o.-year-old patient lying in the bed with no acute distress.  LUNGS: Normal breath sounds bilaterally, no wheezing, rales,rhonchi or crepitation. No use of accessory muscles of respiration.  CARDIOVASCULAR: S1, S2 normal. No murmurs, rubs, or gallops.  ABDOMEN: Soft, non-tender, non-distended. Bowel sounds present. No organomegaly or mass.  NEUROLOGIC: Moves all 4 extremities. PSYCHIATRIC: The patient is alert and oriented x 3.  SKIN: No obvious rash, lesion, or ulcer.   DATA REVIEW:   CBC No results for input(s): WBC, HGB, HCT, PLT in the last 168 hours.  Chemistries  No results for input(s): NA, K, CL, CO2, GLUCOSE, BUN, CREATININE, CALCIUM, MG, AST, ALT, ALKPHOS, BILITOT in the last 168 hours.  Invalid input(s): GFRCGP  Cardiac Enzymes No results for input(s): TROPONINI in the last 168 hours.  Microbiology Results  Results for orders placed or performed during the hospital encounter of 03/16/15  Blood culture (routine x 2)     Status: None   Collection Time: 03/16/15  9:14 PM  Result Value Ref Range Status   Specimen Description BLOOD LEFT ARM  Final   Special Requests BOTTLES DRAWN AEROBIC AND ANAEROBIC 5CC  Final   Culture NO GROWTH 5 DAYS  Final   Report Status 03/21/2015 FINAL  Final  Blood culture (routine x 2)     Status: None   Collection Time: 03/16/15  9:15 PM  Result Value Ref Range Status   Specimen Description BLOOD LEFT ASSIST CONTROL  Final    Special Requests BOTTLES DRAWN AEROBIC AND ANAEROBIC [place]  Final   Culture NO GROWTH 5 DAYS  Final   Report Status 03/21/2015 FINAL  Final  Urine culture     Status: None   Collection Time: 03/16/15  9:33 PM  Result Value Ref Range Status   Specimen Description URINE, RANDOM  Final   Special Requests NONE  Final   Culture NO GROWTH 2 DAYS  Final   Report Status 03/18/2015 FINAL  Final    RADIOLOGY:  No results found.    Follow up with PCP in 1 week.  Management plans discussed with the patient, family and they are in agreement.  CODE STATUS:   TOTAL TIME TAKING CARE OF THIS PATIENT ON DAY OF DISCHARGE: more than 30 minutes.    Hillary Bow R M.D on 03/25/2015 at 1:46 PM  Between 7am to 6pm - Pager - [phone]  After 6pm go to [email or web address] - password [email or web address] [facility]  Office  [phone]  CC: Primary care physician; Dagoberto Ligas, MD     Note: This dictation was prepared with Dragon dictation  along with smaller phrase technology. Any transcriptional errors that result from this process are unintentional.

## 2017-05-19 ENCOUNTER — Other Ambulatory Visit (HOSPITAL_COMMUNITY): Payer: Self-pay | Admitting: Anesthesiology

## 2017-05-19 DIAGNOSIS — M5440 Lumbago with sciatica, unspecified side: Secondary | ICD-10-CM

## 2017-05-26 ENCOUNTER — Ambulatory Visit
Admission: RE | Admit: 2017-05-26 | Discharge: 2017-05-26 | Disposition: A | Discharge status: Home / Self Care | Payer: Medicare HMO | Source: Ambulatory Visit | Attending: Anesthesiology | Admitting: Anesthesiology

## 2017-05-26 DIAGNOSIS — G894 Chronic pain syndrome: Secondary | ICD-10-CM | POA: Diagnosis not present

## 2017-05-26 DIAGNOSIS — M5440 Lumbago with sciatica, unspecified side: Secondary | ICD-10-CM | POA: Diagnosis present

## 2017-05-26 DIAGNOSIS — M5136 Other intervertebral disc degeneration, lumbar region: Secondary | ICD-10-CM | POA: Insufficient documentation

## 2017-05-26 DIAGNOSIS — M4686 Other specified inflammatory spondylopathies, lumbar region: Secondary | ICD-10-CM | POA: Diagnosis not present

## 2017-05-26 DIAGNOSIS — M4807 Spinal stenosis, lumbosacral region: Secondary | ICD-10-CM | POA: Insufficient documentation

## 2017-11-08 ENCOUNTER — Ambulatory Visit
Payer: Medicare HMO | Attending: Student in an Organized Health Care Education/Training Program | Admitting: Student in an Organized Health Care Education/Training Program

## 2017-11-08 ENCOUNTER — Encounter: Payer: Self-pay | Admitting: Student in an Organized Health Care Education/Training Program

## 2017-11-08 ENCOUNTER — Other Ambulatory Visit: Payer: Self-pay

## 2017-11-08 VITALS — BP 99/68 | HR 82 | Temp 98.6°F | Resp 18 | Ht 60.0 in | Wt 190.0 lb

## 2017-11-08 DIAGNOSIS — I10 Essential (primary) hypertension: Secondary | ICD-10-CM | POA: Insufficient documentation

## 2017-11-08 DIAGNOSIS — M5136 Other intervertebral disc degeneration, lumbar region: Secondary | ICD-10-CM | POA: Insufficient documentation

## 2017-11-08 DIAGNOSIS — M1388 Other specified arthritis, other site: Secondary | ICD-10-CM | POA: Diagnosis not present

## 2017-11-08 DIAGNOSIS — M47896 Other spondylosis, lumbar region: Secondary | ICD-10-CM | POA: Diagnosis not present

## 2017-11-08 DIAGNOSIS — M5441 Lumbago with sciatica, right side: Secondary | ICD-10-CM | POA: Diagnosis not present

## 2017-11-08 DIAGNOSIS — M5442 Lumbago with sciatica, left side: Secondary | ICD-10-CM | POA: Diagnosis not present

## 2017-11-08 DIAGNOSIS — Z6837 Body mass index (BMI) 37.0-37.9, adult: Secondary | ICD-10-CM | POA: Diagnosis not present

## 2017-11-08 DIAGNOSIS — Z79899 Other long term (current) drug therapy: Secondary | ICD-10-CM | POA: Diagnosis not present

## 2017-11-08 DIAGNOSIS — Z7984 Long term (current) use of oral hypoglycemic drugs: Secondary | ICD-10-CM | POA: Diagnosis not present

## 2017-11-08 DIAGNOSIS — E119 Type 2 diabetes mellitus without complications: Secondary | ICD-10-CM | POA: Insufficient documentation

## 2017-11-08 DIAGNOSIS — G8929 Other chronic pain: Secondary | ICD-10-CM | POA: Insufficient documentation

## 2017-11-08 DIAGNOSIS — M25552 Pain in left hip: Secondary | ICD-10-CM | POA: Insufficient documentation

## 2017-11-08 DIAGNOSIS — M1288 Other specific arthropathies, not elsewhere classified, other specified site: Secondary | ICD-10-CM | POA: Diagnosis not present

## 2017-11-08 DIAGNOSIS — M542 Cervicalgia: Secondary | ICD-10-CM | POA: Insufficient documentation

## 2017-11-08 DIAGNOSIS — M47818 Spondylosis without myelopathy or radiculopathy, sacral and sacrococcygeal region: Secondary | ICD-10-CM | POA: Diagnosis not present

## 2017-11-08 DIAGNOSIS — E66813 Obesity, class 3: Secondary | ICD-10-CM | POA: Insufficient documentation

## 2017-11-08 DIAGNOSIS — M47816 Spondylosis without myelopathy or radiculopathy, lumbar region: Secondary | ICD-10-CM | POA: Diagnosis not present

## 2017-11-08 DIAGNOSIS — M25551 Pain in right hip: Secondary | ICD-10-CM | POA: Diagnosis not present

## 2017-11-08 NOTE — Patient Instructions (Signed)
____________________________________________________________________________________________  Preparing for your procedure (without sedation)  Instructions: . Oral Intake: Do not eat or drink anything for at least 3 hours prior to your procedure. . Transportation: Unless otherwise stated by your physician, you may drive yourself after the procedure. . Blood Pressure Medicine: Take your blood pressure medicine with a sip of water the morning of the procedure. . Blood thinners:  . Diabetics on insulin: Notify the staff so that you can be scheduled 1st case in the morning. If your diabetes requires high dose insulin, take only  of your normal insulin dose the morning of the procedure and notify the staff that you have done so. . Preventing infections: Shower with an antibacterial soap the morning of your procedure.  . Build-up your immune system: Take 1000 mg of Vitamin C with every meal (3 times a day) the day prior to your procedure. [place]land Kitchen Antibiotics: Inform the staff if you have a condition or reason that requires you to take antibiotics before dental procedures. . Pregnancy: If you are pregnant, call and cancel the procedure. . Sickness: If you have a cold, fever, or any active infections, call and cancel the procedure. . Arrival: You must be in the facility at least 30 minutes prior to your scheduled procedure. . Children: Do not bring any children with you. . Dress appropriately: Bring dark clothing that you would not mind if they get stained. . Valuables: Do not bring any jewelry or valuables.  Procedure appointments are reserved for interventional treatments only. Marland Kitchen No Prescription Refills. . No medication changes will be discussed during procedure appointments. . No disability issues will be discussed.  Remember:  Regular Business hours are:  Monday to Thursday 8:00 AM to 4:00 PM  Provider's Schedule: Milinda Pointer, MD:  Procedure days: Tuesday and Thursday 7:30 AM to 4:00  PM  Gillis Santa, MD:  Procedure days: Monday and Wednesday 7:30 AM to 4:00 PM ____________________________________________________________________________________________  Sacroiliac (SI) Joint Injection Patient Information  Description: The sacroiliac joint connects the scrum (very low back and tailbone) to the ilium (a pelvic bone which also forms half of the hip joint).  Normally this joint experiences very little motion.  When this joint becomes inflamed or unstable low back and or hip and pelvis pain may result.  Injection of this joint with local anesthetics (numbing medicines) and steroids can provide diagnostic information and reduce pain.  This injection is performed with the aid of x-ray guidance into the tailbone area while you are lying on your stomach.   You may experience an electrical sensation down the leg while this is being done.  You may also experience numbness.  We also may ask if we are reproducing your normal pain during the injection.  Conditions which may be treated SI injection:   Low back, buttock, hip or leg pain  Preparation for the Injection:  1. Do not eat any solid food or dairy products within 8 hours of your appointment.  2. You may drink clear liquids up to 3 hours before appointment.  Clear liquids include water, black coffee, juice or soda.  No milk or cream please. 3. You may take your regular medications, including pain medications with a sip of water before your appointment.  Diabetics should hold regular insulin (if take separately) and take 1/2 normal NPH dose the morning of the procedure.  Carry some sugar containing items with you to your appointment. 4. A driver must accompany you and be prepared to drive you home after your  procedure. 5. Bring all of your current medications with you. 6. An IV may be inserted and sedation may be given at the discretion of the physician. 7. A blood pressure cuff, EKG and other monitors will often be applied  during the procedure.  Some patients may need to have extra oxygen administered for a short period.  8. You will be asked to provide medical information, including your allergies, prior to the procedure.  We must know immediately if you are taking blood thinners (like Coumadin/Warfarin) or if you are allergic to IV iodine contrast (dye).  We must know if you could possible be pregnant.  Possible side effects:   Bleeding from needle site  Infection (rare, may require surgery)  Nerve injury (rare)  Numbness & tingling (temporary)  A brief convulsion or seizure  Light-headedness (temporary)  Pain at injection site (several days)  Decreased blood pressure (temporary)  Weakness in the leg (temporary)   Call if you experience:   New onset weakness or numbness of an extremity below the injection site that last more than 8 hours.  Hives or difficulty breathing ( go to the emergency room)  Inflammation or drainage at the injection site  Any new symptoms which are concerning to you  Please note:  Although the local anesthetic injected can often make your back/ hip/ buttock/ leg feel good for several hours after the injections, the pain will likely return.  It takes 3-7 days for steroids to work in the sacroiliac area.  You may not notice any pain relief for at least that one week.  If effective, we will often do a series of three injections spaced 3-6 weeks apart to maximally decrease your pain.  After the initial series, we generally will wait some months before a repeat injection of the same type.  If you have any questions, please call [phone] [facility]

## 2017-11-08 NOTE — Progress Notes (Signed)
Safety precautions to be maintained throughout the outpatient stay will include: orient to surroundings, keep bed in low position, maintain call bell within reach at all times, provide assistance with transfer out of bed and ambulation.  

## 2017-11-08 NOTE — Progress Notes (Signed)
Patient's Name: Hayley Knight  MRN: [id number]  Referring Provider: Nathanial Rancher, MD  DOB: January 09, 1936  PCP: Danelle Berry, NP  DOS: 11/08/2017  Note by: Gillis Santa, MD  Service setting: Ambulatory outpatient  Specialty: Interventional Pain Management  Location: [facility] (AMB) Pain Management Facility  Visit type: Initial Patient Evaluation  Patient type: New Patient   Primary Reason(s) for Visit: Encounter for initial evaluation of one or more chronic problems (new to examiner) potentially causing chronic pain, and posing a threat to normal musculoskeletal function. (Level of risk: High) CC: Back Pain; Hip Pain (bilaterally, left is worse); and Neck Pain  HPI  Hayley Knight is a 82 y.o. year old, female patient, who comes today to see [place] for the first time for an initial evaluation of her chronic pain. She has SI joint arthritis; Chronic bilateral low back pain with bilateral sciatica; Lumbar facet arthropathy; Spondylosis without myelopathy or radiculopathy, lumbar region; Lumbar degenerative disc disease; and Class 2 severe obesity due to excess calories with serious comorbidity and body mass index (BMI) of 37.0 to 37.9 in adult [facility]) on their problem list. Today she comes in for evaluation of her Back Pain; Hip Pain (bilaterally, left is worse); and Neck Pain  Pain Assessment: Location: Right, Left, Lower Back Radiating: down back of left leg to knee Onset: More than a month ago Duration: Chronic pain Quality: Throbbing, Burning Severity: 10-Worst pain ever/10 (subjective, self-reported pain score)  Note: Reported level is inconsistent with clinical observations. Clinically the patient looks like a 2/10 A 2/10 is viewed as "Mild to Moderate" and described as noticeable and distracting. Impossible to hide from other people. More frequent flare-ups. Still possible to adapt and function close to normal. It can be very annoying and may have occasional stronger flare-ups. With discipline,  patients may get used to it and adapt. Information on the proper use of the pain scale provided to the patient today. When using our objective Pain Scale, levels between 6 and 10/10 are said to belong in an emergency room, as it progressively worsens from a 6/10, described as severely limiting, requiring emergency care not usually available at an outpatient pain management facility. At a 6/10 level, communication becomes difficult and requires great effort. Assistance to reach the emergency department may be required. Facial flushing and profuse sweating along with potentially dangerous increases in heart rate and blood pressure will be evident. Effect on ADL: cannot stand for long, cannot take care of housework anymore; does not have much strength in hands anymore Timing: Intermittent Modifying factors: medications, heating pad BP: 99/68  HR: 82  Onset and Duration: Gradual Cause of pain: fall Severity: Getting worse, NAS-11 at its worse: 10/10, NAS-11 at its best: 8/10, NAS-11 now: 10/10 and NAS-11 on the average: 10/10 Timing: Morning and After a period of immobility Aggravating Factors: Bending, Kneeling, Lifiting, Stooping  and Twisting Alleviating Factors: Hot packs, Lying down and Medications Associated Problems: Dizziness, Inability to control bladder (urine) and Weakness Quality of Pain: Aching, Burning, Disabling, Pressure-like, Sharp and Throbbing Previous Examinations or Tests: MRI scan Previous Treatments: Narcotic medications and Physical Therapy  The patient comes into the clinics today for the first time for a chronic pain management evaluation.   82 year old female who was previously seen by Dr. Chancy Milroy for interventional pain management but is now being referred to me for consideration of bilateral sacroiliac joint injection.  Patient has a history of axial low back pain as well as hip pain and buttock pain  that is been present for greater than 2 years.  She has had a right-sided  SI joint injection in the past which was effective for her pain.  She notes return of her right-sided pain as well as worsening left-sided pain.  She also endorses intermittent groin pain.  Patient denies any bowel or bladder dysfunction.  She states that Dr. Chancy Milroy  will continue to manage her medications.  She is currently on OxyContin 10 mg 3 times daily as needed, gabapentin 300 mg twice daily, Valium 5 mg twice daily as needed, Flexeril 10 mg 3 times daily as needed.  Today I took the time to provide the patient with information regarding my pain practice. The patient was informed that my practice is divided into two sections: an interventional pain management section, as well as a completely separate and distinct medication management section. I explained that I have procedure days for my interventional therapies, and evaluation days for follow-ups and medication management. Because of the amount of documentation required during both, they are kept separated. This means that there is the possibility that she may be scheduled for a procedure on one day, and medication management the next. I have also informed her that because of staffing and facility limitations, I no longer take patients for medication management only. To illustrate the reasons for this, I gave the patient the example of surgeons, and how inappropriate it would be to refer a patient to his/her care, just to write for the post-surgical antibiotics on a surgery done by a different surgeon.   Because interventional pain management is my board-certified specialty, the patient was informed that joining my practice means that they are open to any and all interventional therapies. I made it clear that this does not mean that they will be forced to have any procedures done. What this means is that I believe interventional therapies to be essential part of the diagnosis and proper management of chronic pain conditions. Therefore, patients not  interested in these interventional alternatives will be better served under the care of a different practitioner.  The patient was also made aware of my Comprehensive Pain Management Safety Guidelines where by joining my practice, they limit all of their nerve blocks and joint injections to those done by our practice, for as long as we are retained to manage their care.   Historic Controlled Substance Pharmacotherapy Review  PMP and historical list of controlled substances: OxyContin 10 mg 3 times daily as needed, MME equals 45-60  Medications: The patient did not bring the medication(s) to the appointment, as requested in our "New Patient Package" Pharmacodynamics: Desired effects: Analgesia: The patient reports >50% benefit. Reported improvement in function: The patient reports medication allows her to accomplish basic ADLs. Clinically meaningful improvement in function (CMIF): Sustained CMIF goals met Perceived effectiveness: Described as relatively effective, allowing for increase in activities of daily living (ADL) Undesirable effects: Side-effects or Adverse reactions: None reported Historical Monitoring: The patient  reports that she does not use drugs. List of all UDS Test(s): No results found for: MDMA, COCAINSCRNUR, [place], [place], CANNABQUANT, [place], [place] List of other Serum/Urine Drug Screening Test(s):  No results found for: AMPHSCRSER, BARBSCRSER, BENZOSCRSER, COCAINSCRSER, COCAINSCRNUR, PCPSCRSER, PCPQUANT, THCSCRSER, THCU, CANNABQUANT, OPIATESCRSER, OXYSCRSER, PROPOXSCRSER, ETH Historical Background Evaluation: [place] PMP: Six (6) year initial data search conducted.             [place] Department of public safety, offender search: Editor, commissioning Information) Non-contributory Risk Assessment Profile: Aberrant behavior: None observed or detected  today Risk factors for fatal opioid overdose: Benzodiazepine use and None identified today Fatal overdose hazard ratio (HR): Calculation  deferred Non-fatal overdose hazard ratio (HR): Calculation deferred Risk of opioid abuse or dependence: 0.7-3.0% with doses ? 36 MME/day and 6.1-26% with doses ? 120 MME/day. Substance use disorder (SUD) risk level: Low Opioid risk tool (ORT) (Total Score): 0 Opioid Risk Tool - 11/08/17 1223      Family History of Substance Abuse   Alcohol  Negative    Illegal Drugs  Negative    Rx Drugs  Negative      Personal History of Substance Abuse   Alcohol  Negative    Illegal Drugs  Negative    Rx Drugs  Negative      Age   Age between 62-45 years   No      Psychological Disease   Psychological Disease  Negative    Depression  Negative      Total Score   Opioid Risk Tool Scoring  0    Opioid Risk Interpretation  Low Risk      ORT Scoring interpretation table:  Score <3 = Low Risk for SUD  Score between 4-7 = Moderate Risk for SUD  Score >8 = High Risk for Opioid Abuse   PHQ-2 Depression Scale:  Total score: 0  PHQ-2 Scoring interpretation table: (Score and probability of major depressive disorder)  Score 0 = No depression  Score 1 = 15.4% Probability  Score 2 = 21.1% Probability  Score 3 = 38.4% Probability  Score 4 = 45.5% Probability  Score 5 = 56.4% Probability  Score 6 = 78.6% Probability   PHQ-9 Depression Scale:  Total score: 0  PHQ-9 Scoring interpretation table:  Score 0-4 = No depression  Score 5-9 = Mild depression  Score 10-14 = Moderate depression  Score 15-19 = Moderately severe depression  Score 20-27 = Severe depression (2.4 times higher risk of SUD and 2.89 times higher risk of overuse)   Pharmacologic Plan: Medication management per Dr. Chancy Milroy            Initial impression: Pending review of available data and ordered tests.  Meds   Current Outpatient Medications:  .  colesevelam (WELCHOL) 625 MG tablet, Take 625 mg by mouth 2 (two) times daily. Patient will stop taking if she becomes constipated, Disp: , Rfl:  .  diazepam (VALIUM) 5 MG tablet, Take  5 mg by mouth every 12 (twelve) hours as needed for anxiety., Disp: , Rfl:  .  escitalopram (LEXAPRO) 20 MG tablet, Take 20 mg by mouth daily., Disp: , Rfl:  .  fenofibrate (TRICOR) 145 MG tablet, Take 145 mg by mouth daily., Disp: , Rfl:  .  furosemide (LASIX) 20 MG tablet, Take 20 mg by mouth as directed. Take once weekly as needed for fluid retention, Disp: , Rfl:  .  gabapentin (NEURONTIN) 300 MG capsule, Take 300 mg by mouth 2 (two) times daily., Disp: , Rfl:  .  losartan (COZAAR) 100 MG tablet, Take 100 mg by mouth daily., Disp: , Rfl:  .  metFORMIN (GLUCOPHAGE) 500 MG tablet, Take 500 mg by mouth daily with breakfast., Disp: , Rfl:  .  Multiple Vitamin (MULTIVITAMIN) capsule, Take 1 capsule by mouth daily., Disp: , Rfl:  .  omega-3 acid ethyl esters (LOVAZA) 1 G capsule, Take 2 g by mouth 2 (two) times daily., Disp: , Rfl:  .  oxyCODONE (OXYCONTIN) 10 mg 12 hr tablet, Take 10 mg by mouth 3 (  three) times daily. , Disp: , Rfl:  .  ALPRAZolam (XANAX) 0.5 MG tablet, Take 0.5 mg by mouth 2 (two) times daily as needed for anxiety., Disp: , Rfl:  .  cyclobenzaprine (FLEXERIL) 10 MG tablet, Take 10 mg by mouth 3 (three) times daily as needed for muscle spasms., Disp: , Rfl:  .  dexlansoprazole (DEXILANT) 60 MG capsule, Take 60 mg by mouth daily., Disp: , Rfl:  .  ezetimibe (ZETIA) 10 MG tablet, Take 10 mg by mouth daily., Disp: , Rfl:   Imaging Review   Lumbosacral Imaging: Lumbar MR wo contrast:  Results for orders placed during the hospital encounter of 05/26/17  MR LUMBAR SPINE WO CONTRAST   Narrative CLINICAL DATA:  Low back pain and right buttock pain.  Sciatica.  EXAM: MRI LUMBAR SPINE WITHOUT CONTRAST  TECHNIQUE: Multiplanar, multisequence MR imaging of the lumbar spine was performed. No intravenous contrast was administered.  COMPARISON:  Lumbar MRI dated 04/10/2014  FINDINGS: Segmentation:  Standard.  Alignment:  Physiologic.  Vertebrae:  No fracture, evidence of  discitis, or bone lesion.  Conus medullaris and cauda equina: Conus extends to the L1-2 level. Conus and cauda equina appear normal.  Paraspinal and other soft tissues: Negative.  Disc levels:  T12-L1: Chronic disc desiccation with a small broad-based chronic disc bulge slightly asymmetric to the right with no neural impingement, unchanged.  L1-2: Chronic disc degeneration with chronic disc space narrowing with a small broad-based disc bulge asymmetric to the right without neural impingement, unchanged.  L2-3: Chronic disc space narrowing. Small broad-based disc protrusion extending to the right and left of midline compressing the ventral aspect of the thecal sac and compressing the left lateral recess slightly. This is unchanged.  L3-4: Disc desiccation. Small disc bulges into the neural foramina, more prominent on the right the than the left with no neural impingement. Slight hypertrophy of the ligamentum flavum. No neural impingement. No significant change.  L4-5: Chronic disc space narrowing with a small broad-based disc bulge without neural impingement. Slight hypertrophy of the facet joints and ligamentum flavum. No change.  L5-S1: Chronic severe disc space narrowing. Broad-based endplate osteophytes without neural impingement. Minimal degenerative changes of the facet joints. Moderate bilateral foraminal stenosis, unchanged.  IMPRESSION: 1. Diffuse degenerative disc disease throughout the lumbar spine without focal neural impingement and without change since the prior study. 2. Slight degenerative facet arthritis in the lower lumbar spine, unchanged. 3. Moderate bilateral foraminal stenosis at L5-S1, unchanged. The L5 nerves appear to exit without impingement.   Electronically Signed   By: Lorriane Shire M.D.   On: 05/26/2017 16:16    Complexity Note: Imaging results reviewed. Results shared with Hayley Knight, using Layman's terms.                          ROS  Cardiovascular: Daily Aspirin intake and High blood pressure Pulmonary or Respiratory: Shortness of breath Neurological: No reported neurological signs or symptoms such as seizures, abnormal skin sensations, urinary and/or fecal incontinence, being born with an abnormal open spine and/or a tethered spinal cord Review of Past Neurological Studies: No results found for this or any previous visit. Psychological-Psychiatric: Difficulty sleeping and or falling asleep Gastrointestinal: No reported gastrointestinal signs or symptoms such as vomiting or evacuating blood, reflux, heartburn, alternating episodes of diarrhea and constipation, inflamed or scarred liver, or pancreas or irrregular and/or infrequent bowel movements Genitourinary: Kidney disease Hematological: No reported hematological signs or symptoms such as  prolonged bleeding, low or poor functioning platelets, bruising or bleeding easily, hereditary bleeding problems, low energy levels due to low hemoglobin or being anemic Endocrine: borderline diabetic Rheumatologic: Joint aches and or swelling due to excess weight (Osteoarthritis) Musculoskeletal: Negative for myasthenia gravis, muscular dystrophy, multiple sclerosis or malignant hyperthermia Work History: Working full time  Allergies  Hayley Knight is allergic to ivp dye [iodinated diagnostic agents] and sulfa antibiotics.  Laboratory Chemistry  Inflammation Markers (CRP: Acute Phase) (ESR: Chronic Phase) Lab Results  Component Value Date   LATICACIDVEN 1.8 03/17/2015                         Rheumatology Markers No results found for: RF, ANA, LABURIC, URICUR, LYMEIGGIGMAB, LYMEABIGMQN, HLAB27                      Renal Function Markers Lab Results  Component Value Date   BUN 9 03/16/2015   CREATININE 0.97 03/16/2015   GFRAA >60 03/16/2015   GFRNONAA 55 (L) 03/16/2015                             Hepatic Function Markers Lab Results  Component Value Date   AST  56 (H) 03/16/2015   ALT 41 03/16/2015   ALBUMIN 4.0 03/16/2015   ALKPHOS 70 03/16/2015                        Electrolytes Lab Results  Component Value Date   NA 135 03/16/2015   K 3.6 03/16/2015   CL 100 (L) 03/16/2015   CALCIUM 9.3 03/16/2015                        Neuropathy Markers Lab Results  Component Value Date   HGBA1C 5.7 03/16/2015                        Bone Pathology Markers No results found for: VD25OH, VD125OH2TOT, WJ1914NW2, NF6213YQ6, 25OHVITD1, 25OHVITD2, 25OHVITD3, TESTOFREE, TESTOSTERONE                       Coagulation Parameters Lab Results  Component Value Date   PLT 336 03/18/2015                        Cardiovascular Markers Lab Results  Component Value Date   BNP 141.0 (H) 03/18/2015   CKTOTAL 342 (H) 12/30/2012   TROPONINI < 0.02 06/02/2014   HGB 10.4 (L) 03/18/2015   HCT 31.5 (L) 03/18/2015                         CA Markers No results found for: CEA, CA125, LABCA2                      Note: Lab results reviewed.  [place]  Drug: Hayley Knight  reports that she does not use drugs. Alcohol:  reports that she does not drink alcohol. Tobacco:  reports that she has never smoked. She has never used smokeless tobacco. Medical:  has a past medical history of Arthritis, Diabetes mellitus without complication ([place]), and Hypertension. Family: family history is not on file.  History reviewed. No pertinent surgical history. Active Ambulatory Problems    Diagnosis Date Noted  . SI  joint arthritis 11/08/2017  . Chronic bilateral low back pain with bilateral sciatica 11/08/2017  . Lumbar facet arthropathy 11/08/2017  . Spondylosis without myelopathy or radiculopathy, lumbar region 11/08/2017  . Lumbar degenerative disc disease 11/08/2017  . Class 2 severe obesity due to excess calories with serious comorbidity and body mass index (BMI) of 37.0 to 37.9 in adult [facility]) 11/08/2017   Resolved Ambulatory Problems    Diagnosis Date Noted  . Sepsis  ([place]) 03/16/2015   Past Medical History:  Diagnosis Date  . Arthritis   . Diabetes mellitus without complication ([place])   . Hypertension    Constitutional Exam  General appearance: alert, cooperative and morbidly obese  Vitals:   11/08/17 1154  BP: 99/68  Pulse: 82  Resp: 18  Temp: 98.6 F (37 C)  TempSrc: Oral  SpO2: 96%  Weight: 190 lb (86.2 kg)  Height: 5' (1.524 m)   BMI Assessment: Estimated body mass index is 37.11 kg/m as calculated from the following:   Height as of this encounter: 5' (1.524 m).   Weight as of this encounter: 190 lb (86.2 kg).  BMI interpretation table: BMI level Category Range association with higher incidence of chronic pain  <18 kg/m2 Underweight   18.5-24.9 kg/m2 Ideal body weight   25-29.9 kg/m2 Overweight Increased incidence by 20%  30-34.9 kg/m2 Obese (Class I) Increased incidence by 68%  35-39.9 kg/m2 Severe obesity (Class II) Increased incidence by 136%  >40 kg/m2 Extreme obesity (Class III) Increased incidence by 254%   Patient's current BMI Ideal Body weight  Body mass index is 37.11 kg/m. Ideal body weight: 45.5 kg (100 lb 4.9 oz) Adjusted ideal body weight: 61.8 kg (136 lb 3 oz)   BMI Readings from Last 4 Encounters:  11/08/17 37.11 kg/m  03/18/15 37.83 kg/m   Wt Readings from Last 4 Encounters:  11/08/17 190 lb (86.2 kg)  03/18/15 187 lb 4.8 oz (85 kg)  Psych/Mental status: Alert, oriented x 3 (person, place, & time)       Eyes: PERLA Respiratory: No evidence of acute respiratory distress  Cervical Spine Area Exam  Skin & Axial Inspection: No masses, redness, edema, swelling, or associated skin lesions Alignment: Symmetrical Functional ROM: Unrestricted ROM      Stability: No instability detected Muscle Tone/Strength: Functionally intact. No obvious neuro-muscular anomalies detected. Sensory (Neurological): Unimpaired Palpation: No palpable anomalies              Upper Extremity (UE) Exam    Side: Right upper  extremity  Side: Left upper extremity  Skin & Extremity Inspection: Skin color, temperature, and hair growth are WNL. No peripheral edema or cyanosis. No masses, redness, swelling, asymmetry, or associated skin lesions. No contractures.  Skin & Extremity Inspection: Skin color, temperature, and hair growth are WNL. No peripheral edema or cyanosis. No masses, redness, swelling, asymmetry, or associated skin lesions. No contractures.  Functional ROM: Unrestricted ROM          Functional ROM: Unrestricted ROM          Muscle Tone/Strength: Functionally intact. No obvious neuro-muscular anomalies detected.  Muscle Tone/Strength: Functionally intact. No obvious neuro-muscular anomalies detected.  Sensory (Neurological): Unimpaired          Sensory (Neurological): Unimpaired          Palpation: No palpable anomalies              Palpation: No palpable anomalies              Provocative  Test(s):  Phalen's test: deferred Tinel's test: deferred Apley's scratch test (touch opposite shoulder):  Action 1 (Across chest): deferred Action 2 (Overhead): deferred Action 3 (LB reach): deferred   Provocative Test(s):  Phalen's test: deferred Tinel's test: deferred Apley's scratch test (touch opposite shoulder):  Action 1 (Across chest): deferred Action 2 (Overhead): deferred Action 3 (LB reach): deferred    Thoracic Spine Area Exam  Skin & Axial Inspection: No masses, redness, or swelling Alignment: Symmetrical Functional ROM: Unrestricted ROM Stability: No instability detected Muscle Tone/Strength: Functionally intact. No obvious neuro-muscular anomalies detected. Sensory (Neurological): Unimpaired Muscle strength & Tone: No palpable anomalies  Lumbar Spine Area Exam  Skin & Axial Inspection: No masses, redness, or swelling Alignment: Symmetrical Functional ROM: Decreased ROM affecting both sides Stability: No instability detected Muscle Tone/Strength: Functionally intact. No obvious neuro-muscular  anomalies detected. Sensory (Neurological): Musculoskeletal pain pattern Palpation: No palpable anomalies       Provocative Tests: Lumbar Hyperextension/rotation test: (+) bilaterally for facet joint pain. Lumbar quadrant test (Kemp's test): deferred today       Lumbar Lateral bending test: deferred today       Patrick's Maneuver: (+) for bilateral S-I arthralgia             FABER test: (+) for bilateral S-I arthralgia             Thigh-thrust test: deferred today       S-I compression test: deferred today       S-I distraction test: deferred today        Gait & Posture Assessment  Ambulation: Unassisted Gait: Modified gait pattern (slower gait speed, wider stride width, and longer stance duration) associated with morbid obesity Posture: WNL   Lower Extremity Exam    Side: Right lower extremity  Side: Left lower extremity  Stability: No instability observed          Stability: No instability observed          Skin & Extremity Inspection: Skin color, temperature, and hair growth are WNL. No peripheral edema or cyanosis. No masses, redness, swelling, asymmetry, or associated skin lesions. No contractures.  Skin & Extremity Inspection: Skin color, temperature, and hair growth are WNL. No peripheral edema or cyanosis. No masses, redness, swelling, asymmetry, or associated skin lesions. No contractures.  Functional ROM: Unrestricted ROM                  Functional ROM: Unrestricted ROM                  Muscle Tone/Strength: Functionally intact. No obvious neuro-muscular anomalies detected.  Muscle Tone/Strength: Functionally intact. No obvious neuro-muscular anomalies detected.  Sensory (Neurological): Unimpaired  Sensory (Neurological): Unimpaired  Palpation: No palpable anomalies  Palpation: No palpable anomalies   Assessment  Primary Diagnosis & Pertinent Problem List: The primary encounter diagnosis was SI joint arthritis. Diagnoses of Chronic bilateral low back pain with bilateral  sciatica, Lumbar facet arthropathy, Spondylosis without myelopathy or radiculopathy, lumbar region, Lumbar degenerative disc disease, and Class 2 severe obesity due to excess calories with serious comorbidity and body mass index (BMI) of 37.0 to 37.9 in adult [facility]) were also pertinent to this visit.  Visit Diagnosis (New problems to examiner): 1. SI joint arthritis   2. Chronic bilateral low back pain with bilateral sciatica   3. Lumbar facet arthropathy   4. Spondylosis without myelopathy or radiculopathy, lumbar region   5. Lumbar degenerative disc disease   6. Class 2 severe obesity  due to excess calories with serious comorbidity and body mass index (BMI) of 37.0 to 37.9 in adult [facility])   General Recommendations: The pain condition that the patient suffers from is best treated with a multidisciplinary approach that involves an increase in physical activity to prevent de-conditioning and worsening of the pain cycle, as well as psychological counseling (formal and/or informal) to address the co-morbid psychological affects of pain. Treatment will often involve judicious use of pain medications and interventional procedures to decrease the pain, allowing the patient to participate in the physical activity that will ultimately produce long-lasting pain reductions. The goal of the multidisciplinary approach is to return the patient to a higher level of overall function and to restore their ability to perform activities of daily living.  82 year old female who was previously seen by Dr. Chancy Milroy for interventional pain management but is now being referred to me for consideration of bilateral sacroiliac joint injection.  Patient has a history of axial low back pain as well as hip pain and buttock pain that is been present for greater than 2 years.  She has had a right-sided SI joint injection in the past which was effective for her pain.  She notes return of her right-sided pain as well as worsening left-sided  pain.  She also endorses intermittent groin pain.  Patient denies any bowel or bladder dysfunction.  She states that Dr. Chancy Milroy  will continue to manage her medications.  She is currently on OxyContin 10 mg 3 times daily as needed, gabapentin 300 mg twice daily, Valium 5 mg twice daily as needed, Flexeril 10 mg 3 times daily as needed.  Medication management to continue with her current provider.  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  We also discussed bilateral lumbar facet medial branch diagnostic nerve blocks L3, L4, L5 bilaterally with possible radio frequency ablation.  We will start with bilateral SI joint injection and see how it impacts her hip and groin pain.  Plan: -Continue medication management with Dr. 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 SACROILIAC JOINT INJECTION     Interventional management options: Hayley Knight was informed that there is no guarantee that she would be a candidate for interventional therapies. The decision will be based on the results of diagnostic studies, as well as Hayley Knight's risk profile.  Procedure(s) under consideration:  Bilateral SI joint injection Lumbar facet medial branch nerve block Trigger point injection   Provider-requested follow-up: Return in about 1 week (around 11/15/2017) for Procedure.  No future appointments.  Primary Care Physician: Danelle Berry, NP Location: [facility] Facility Note by: Gillis Santa, M.D, Date: 11/08/2017; Time: 3:42 PM  Patient Instructions  ____________________________________________________________________________________________  Preparing for your procedure (without sedation)  Instructions: . Oral Intake: Do not eat or drink anything for at  least 3 hours prior to your procedure. . Transportation: Unless otherwise stated by your physician, you may drive yourself after the procedure. . Blood Pressure Medicine: Take your blood pressure medicine with a sip of water the morning of the procedure. . Blood thinners:  . Diabetics on insulin: Notify the staff so that you can be scheduled 1st case in the morning. If your diabetes requires high dose insulin, take only  of your normal insulin dose the morning of the procedure and notify the staff  that you have done so. . Preventing infections: Shower with an antibacterial soap the morning of your procedure.  . Build-up your immune system: Take 1000 mg of Vitamin C with every meal (3 times a day) the day prior to your procedure. Marland Kitchen Antibiotics: Inform the staff if you have a condition or reason that requires you to take antibiotics before dental procedures. . Pregnancy: If you are pregnant, call and cancel the procedure. . Sickness: If you have a cold, fever, or any active infections, call and cancel the procedure. . Arrival: You must be in the facility at least 30 minutes prior to your scheduled procedure. . Children: Do not bring any children with you. . Dress appropriately: Bring dark clothing that you would not mind if they get stained. . Valuables: Do not bring any jewelry or valuables.  Procedure appointments are reserved for interventional treatments only. Marland Kitchen No Prescription Refills. . No medication changes will be discussed during procedure appointments. . No disability issues will be discussed.  Remember:  Regular Business hours are:  Monday to Thursday 8:00 AM to 4:00 PM  Provider's Schedule: Milinda Pointer, MD:  Procedure days: Tuesday and Thursday 7:30 AM to 4:00 PM  Gillis Santa, MD:  Procedure days: Monday and Wednesday 7:30 AM to 4:00 PM ____________________________________________________________________________________________  Sacroiliac (SI) Joint  Injection Patient Information  Description: The sacroiliac joint connects the scrum (very low back and tailbone) to the ilium (a pelvic bone which also forms half of the hip joint).  Normally this joint experiences very little motion.  When this joint becomes inflamed or unstable low back and or hip and pelvis pain may result.  Injection of this joint with local anesthetics (numbing medicines) and steroids can provide diagnostic information and reduce pain.  This injection is performed with the aid of x-ray guidance into the tailbone area while you are lying on your stomach.   You may experience an electrical sensation down the leg while this is being done.  You may also experience numbness.  We also may ask if we are reproducing your normal pain during the injection.  Conditions which may be treated SI injection:   Low back, buttock, hip or leg pain  Preparation for the Injection:  1. Do not eat any solid food or dairy products within 8 hours of your appointment.  2. You may drink clear liquids up to 3 hours before appointment.  Clear liquids include water, black coffee, juice or soda.  No milk or cream please. 3. You may take your regular medications, including pain medications with a sip of water before your appointment.  Diabetics should hold regular insulin (if take separately) and take 1/2 normal NPH dose the morning of the procedure.  Carry some sugar containing items with you to your appointment. 4. A driver must accompany you and be prepared to drive you home after your procedure. 5. Bring all of your current medications with you. 6. An IV may be inserted and sedation may be given at the discretion of the physician. 7. A blood pressure cuff, EKG and other monitors will often be applied during the procedure.  Some patients may need to have extra oxygen administered for a short period.  8. You will be asked to provide medical information, including your allergies, prior to the procedure.   We must know immediately if you are taking blood thinners (like Coumadin/Warfarin) or if you are allergic to IV iodine contrast (dye).  We must know if you could possible be pregnant.  Possible side effects:   Bleeding from needle site  Infection (rare, may require surgery)  Nerve injury (rare)  Numbness & tingling (temporary)  A brief convulsion or seizure  Light-headedness (temporary)  Pain at injection site (several days)  Decreased blood pressure (temporary)  Weakness in the leg (temporary)   Call if you experience:   New onset weakness or numbness of an extremity below the injection site that last more than 8 hours.  Hives or difficulty breathing ( go to the emergency room)  Inflammation or drainage at the injection site  Any new symptoms which are concerning to you  Please note:  Although the local anesthetic injected can often make your back/ hip/ buttock/ leg feel good for several hours after the injections, the pain will likely return.  It takes 3-7 days for steroids to work in the sacroiliac area.  You may not notice any pain relief for at least that one week.  If effective, we will often do a series of three injections spaced 3-6 weeks apart to maximally decrease your pain.  After the initial series, we generally will wait some months before a repeat injection of the same type.  If you have any questions, please call [phone] [facility]

## 2017-11-15 ENCOUNTER — Ambulatory Visit
Admission: RE | Admit: 2017-11-15 | Discharge: 2017-11-15 | Disposition: A | Discharge status: Home / Self Care | Payer: Medicare HMO | Source: Ambulatory Visit | Attending: Student in an Organized Health Care Education/Training Program | Admitting: Student in an Organized Health Care Education/Training Program

## 2017-11-15 ENCOUNTER — Other Ambulatory Visit: Payer: Self-pay

## 2017-11-15 ENCOUNTER — Encounter: Payer: Self-pay | Admitting: Student in an Organized Health Care Education/Training Program

## 2017-11-15 ENCOUNTER — Ambulatory Visit (HOSPITAL_BASED_OUTPATIENT_CLINIC_OR_DEPARTMENT_OTHER): Payer: Medicare HMO | Admitting: Student in an Organized Health Care Education/Training Program

## 2017-11-15 DIAGNOSIS — M461 Sacroiliitis, not elsewhere classified: Secondary | ICD-10-CM | POA: Insufficient documentation

## 2017-11-15 DIAGNOSIS — Z79899 Other long term (current) drug therapy: Secondary | ICD-10-CM | POA: Insufficient documentation

## 2017-11-15 DIAGNOSIS — M545 Low back pain: Secondary | ICD-10-CM | POA: Insufficient documentation

## 2017-11-15 DIAGNOSIS — Z882 Allergy status to sulfonamides status: Secondary | ICD-10-CM | POA: Diagnosis not present

## 2017-11-15 DIAGNOSIS — M47818 Spondylosis without myelopathy or radiculopathy, sacral and sacrococcygeal region: Secondary | ICD-10-CM | POA: Diagnosis not present

## 2017-11-15 DIAGNOSIS — Z7984 Long term (current) use of oral hypoglycemic drugs: Secondary | ICD-10-CM | POA: Diagnosis not present

## 2017-11-15 MED ORDER — ROPIVACAINE HCL 2 MG/ML IJ SOLN
INTRAMUSCULAR | Status: AC
  Filled 2017-11-15: qty 10

## 2017-11-15 MED ORDER — LACTATED RINGERS IV SOLN
1000.0000 mL | Freq: Once | INTRAVENOUS | Status: AC
  Administered 2017-11-15: 1000 mL via INTRAVENOUS

## 2017-11-15 MED ORDER — DEXAMETHASONE SODIUM PHOSPHATE 10 MG/ML IJ SOLN
INTRAMUSCULAR | Status: AC
  Filled 2017-11-15: qty 1

## 2017-11-15 MED ORDER — FENTANYL CITRATE (PF) 100 MCG/2ML IJ SOLN
INTRAMUSCULAR | Status: AC
  Filled 2017-11-15: qty 2

## 2017-11-15 MED ORDER — DEXAMETHASONE SODIUM PHOSPHATE 10 MG/ML IJ SOLN
10.0000 mg | Freq: Once | INTRAMUSCULAR | Status: AC
  Administered 2017-11-15: 10 mg

## 2017-11-15 MED ORDER — LIDOCAINE HCL 2 % IJ SOLN
INTRAMUSCULAR | Status: AC
  Filled 2017-11-15: qty 20

## 2017-11-15 MED ORDER — ROPIVACAINE HCL 2 MG/ML IJ SOLN
10.0000 mL | Freq: Once | INTRAMUSCULAR | Status: AC
  Administered 2017-11-15: 10 mL

## 2017-11-15 MED ORDER — FENTANYL CITRATE (PF) 100 MCG/2ML IJ SOLN
25.0000 ug | INTRAMUSCULAR | Status: DC | PRN
  Administered 2017-11-15: 25 ug via INTRAVENOUS

## 2017-11-15 MED ORDER — LIDOCAINE HCL 2 % IJ SOLN
20.0000 mL | Freq: Once | INTRAMUSCULAR | Status: AC
  Administered 2017-11-15: 400 mg

## 2017-11-15 NOTE — Progress Notes (Signed)
Patient's Name: Hayley Knight  MRN: [id number]  Referring Provider: Gillis Santa, MD  DOB: 02/23/1936  PCP: Danelle Berry, NP  DOS: 11/15/2017  Note by: Gillis Santa, MD  Service setting: Ambulatory outpatient  Specialty: Interventional Pain Management  Patient type: Established  Location: [facility] (AMB) Pain Management Facility  Visit type: Interventional Procedure   Primary Reason for Visit: Interventional Pain Management Treatment. CC: Back Pain (low)  Procedure:          Anesthesia, Analgesia, Anxiolysis:  Type: Diagnostic Sacroiliac Joint Steroid Injection          Region: Inferior Lumbosacral Region Level: PIIS (Posterior Inferior Iliac Spine) Laterality: Bilateral  Type: Moderate (Conscious) Sedation combined with Local Anesthesia Indication(s): Analgesia and Anxiety Route: Intravenous (IV) IV Access: Secured Sedation: Meaningful verbal contact was maintained at all times during the procedure  Local Anesthetic: Lidocaine 2%   Indications: 1. SI joint arthritis    Pain Score: Pre-procedure: 8 /10 Post-procedure: 0-No pain/10  Pre-op Assessment:  [name] is a 82 y.o. (year old), female patient, seen today for interventional treatment. She  has no past surgical history on file. [name] has a current medication list which includes the following prescription(s): alprazolam, colesevelam, cyclobenzaprine, dexlansoprazole, diazepam, escitalopram, ezetimibe, fenofibrate, furosemide, gabapentin, losartan, metformin, multivitamin, omega-3 acid ethyl esters, and oxycodone, and the following Facility-Administered Medications: fentanyl and lactated ringers. Her primarily concern today is the Back Pain (low)  Initial Vital Signs:  Pulse/HCG Rate: 72  Temp: 98.2 F (36.8 C) Resp: 16 BP: (!) 110/56 SpO2: 95 %  BMI: Estimated body mass index is 37.11 kg/m as calculated from the following:   Height as of this encounter: 5' (1.524 m).   Weight as of this encounter: 190 lb  (86.2 kg).  Risk Assessment: Allergies: Reviewed. She is allergic to ivp dye [iodinated diagnostic agents] and sulfa antibiotics.  Allergy Precautions: None required Coagulopathies: Reviewed. None identified.  Blood-thinner therapy: None at this time Active Infection(s): Reviewed. None identified. [name] is afebrile  Site Confirmation: [name] was asked to confirm the procedure and laterality before marking the site Procedure checklist: Completed Consent: Before the procedure and under the influence of no sedative(s), amnesic(s), or anxiolytics, the patient was informed of the treatment options, risks and possible complications. To fulfill our ethical and legal obligations, as recommended by the American Medical Association's Code of Ethics, I have informed the patient of my clinical impression; the nature and purpose of the treatment or procedure; the risks, benefits, and possible complications of the intervention; the alternatives, including doing nothing; the risk(s) and benefit(s) of the alternative treatment(s) or procedure(s); and the risk(s) and benefit(s) of doing nothing. The patient was provided information about the general risks and possible complications associated with the procedure. These may include, but are not limited to: failure to achieve desired goals, infection, bleeding, organ or nerve damage, allergic reactions, paralysis, and death. In addition, the patient was informed of those risks and complications associated to the procedure, such as failure to decrease pain; infection; bleeding; organ or nerve damage with subsequent damage to sensory, motor, and/or autonomic systems, resulting in permanent pain, numbness, and/or weakness of one or several areas of the body; allergic reactions; (i.e.: anaphylactic reaction); and/or death. Furthermore, the patient was informed of those risks and complications associated with the medications. These include, but are not limited to:  allergic reactions (i.e.: anaphylactic or anaphylactoid reaction(s)); adrenal axis suppression; blood sugar elevation that in diabetics may result in ketoacidosis or comma;  water retention that in patients with history of congestive heart failure may result in shortness of breath, pulmonary edema, and decompensation with resultant heart failure; weight gain; swelling or edema; medication-induced neural toxicity; particulate matter embolism and blood vessel occlusion with resultant organ, and/or nervous system infarction; and/or aseptic necrosis of one or more joints. Finally, the patient was informed that Medicine is not an exact science; therefore, there is also the possibility of unforeseen or unpredictable risks and/or possible complications that may result in a catastrophic outcome. The patient indicated having understood very clearly. We have given the patient no guarantees and we have made no promises. Enough time was given to the patient to ask questions, all of which were answered to the patient's satisfaction. [name] has indicated that she wanted to continue with the procedure. Attestation: I, the ordering provider, attest that I have discussed with the patient the benefits, risks, side-effects, alternatives, likelihood of achieving goals, and potential problems during recovery for the procedure that I have provided informed consent. Date  Time: 11/15/2017 10:26 AM  Pre-Procedure Preparation:  Monitoring: As per clinic protocol. Respiration, ETCO2, SpO2, BP, heart rate and rhythm monitor placed and checked for adequate function Safety Precautions: Patient was assessed for positional comfort and pressure points before starting the procedure. Time-out: I initiated and conducted the "Time-out" before starting the procedure, as per protocol. The patient was asked to participate by confirming the accuracy of the "Time Out" information. Verification of the correct person, site, and procedure were  performed and confirmed by me, the nursing staff, and the patient. "Time-out" conducted as per Joint Commission's Universal Protocol (UP.01.01.01). Time: 1109  Description of Procedure:          Position: Prone Target Area: Inferior, posterior, aspect of the sacroiliac fissure Approach: Posterior, paraspinal, ipsilateral approach. Area Prepped: Entire Lower Lumbosacral Region Prepping solution: ChloraPrep (2% chlorhexidine gluconate and 70% isopropyl alcohol) Safety Precautions: Aspiration looking for blood return was conducted prior to all injections. At no point did we inject any substances, as a needle was being advanced. No attempts were made at seeking any paresthesias. Safe injection practices and needle disposal techniques used. Medications properly checked for expiration dates. SDV (single dose vial) medications used. Description of the Procedure: Protocol guidelines were followed. The patient was placed in position over the procedure table. The target area was identified and the area prepped in the usual manner. Skin & deeper tissues infiltrated with local anesthetic. Appropriate amount of time allowed to pass for local anesthetics to take effect. The procedure needle was advanced under fluoroscopic guidance into the sacroiliac joint until a firm endpoint was obtained. Proper needle placement secured. Negative aspiration confirmed. Solution injected in intermittent fashion, asking for systemic symptoms every 0.5cc of injectate. The needles were then removed and the area cleansed, making sure to leave some of the prepping solution back to take advantage of its long term bactericidal properties. Vitals:   11/15/17 1115 11/15/17 1125 11/15/17 1135 11/15/17 1145  BP: (!) 156/69 (!) 124/58 (!) 126/42 139/83  Pulse: 66 64 65 68  Resp: 13 12 20 18   Temp:  98.3 F (36.8 C)  98.6 F (37 C)  SpO2: 97% 96% 96% 96%  Weight:      Height:        Start Time: 1109 hrs. End Time: 1114  hrs. Materials:  Needle(s) Type: Regular needle Gauge: 22G Length: 3.5-in Medication(s): Please see orders for medications and dosing details. 10 cc solution made of 9 cc of  0.2% ropivacaine, 1 cc of Decadron 10 mg/cc.  2.5 cc injected intra-articular, 2.5 cc injected periarticular on each side. Imaging Guidance (Non-Spinal):          Type of Imaging Technique: Fluoroscopy Guidance (Non-Spinal) Indication(s): Assistance in needle guidance and placement for procedures requiring needle placement in or near specific anatomical locations not easily accessible without such assistance. Exposure Time: Please see nurses notes. Contrast: Before injecting any contrast, we confirmed that the patient did not have an allergy to iodine, shellfish, or radiological contrast. Once satisfactory needle placement was completed at the desired level, radiological contrast was injected. Contrast injected under live fluoroscopy. No contrast complications. See chart for type and volume of contrast used. Fluoroscopic Guidance: I was personally present during the use of fluoroscopy. "Tunnel Vision Technique" used to obtain the best possible view of the target area. Parallax error corrected before commencing the procedure. "Direction-depth-direction" technique used to introduce the needle under continuous pulsed fluoroscopy. Once target was reached, antero-posterior, oblique, and lateral fluoroscopic projection used confirm needle placement in all planes. Images permanently stored in EMR. Interpretation: I personally interpreted the imaging intraoperatively. Adequate needle placement confirmed in multiple planes. Appropriate spread of contrast into desired area was observed. No evidence of afferent or efferent intravascular uptake. Permanent images saved into the patient's record.  Antibiotic Prophylaxis:   Anti-infectives (From admission, onward)   None     Indication(s): None identified  Post-operative Assessment:   Post-procedure Vital Signs:  Pulse/HCG Rate: 68  Temp: 98.6 F (37 C) Resp: 18 BP: 139/83 SpO2: 96 %  EBL: None  Complications: No immediate post-treatment complications observed by team, or reported by patient.  Note: The patient tolerated the entire procedure well. A repeat set of vitals were taken after the procedure and the patient was kept under observation following institutional policy, for this type of procedure. Post-procedural neurological assessment was performed, showing return to baseline, prior to discharge. The patient was provided with post-procedure discharge instructions, including a section on how to identify potential problems. Should any problems arise concerning this procedure, the patient was given instructions to immediately contact us, at any time, without hesitation. In any case, we plan to contact the patient by telephone for a follow-up status report regarding this interventional procedure.  Comments:  No additional relevant information. 5 out of 5 strength bilateral lower extremity: Plantar flexion, dorsiflexion, knee flexion, knee extension.  Plan of Care   Imaging Orders     DG C-Arm 1-60 Min-No Report Procedure Orders    No procedure(s) ordered today    Medications ordered for procedure: Meds ordered this encounter  Medications  . lactated ringers infusion 1,000 mL  . fentaNYL (SUBLIMAZE) injection 25-100 mcg    Make sure Narcan is available in the pyxis when using this medication. In the event of respiratory depression (RR< 8/min): Titrate NARCAN (naloxone) in increments of 0.1 to 0.2 mg IV at 2-3 minute intervals, until desired degree of reversal.  . lidocaine (XYLOCAINE) 2 % (with pres) injection 400 mg  . ropivacaine (PF) 2 mg/mL (0.2%) (NAROPIN) injection 10 mL  . dexamethasone (DECADRON) injection 10 mg   Medications administered: We administered lactated ringers, fentaNYL, lidocaine, ropivacaine (PF) 2 mg/mL (0.2%), and dexamethasone.   See the medical record for exact dosing, route, and time of administration.  New Prescriptions   No medications on file   Disposition: Discharge home  Discharge Date & Time: 11/15/2017; 1150 hrs.   Physician-requested Follow-up: Return in about 3 weeks (around 12/06/2017) for  Post Procedure Evaluation.  Future Appointments  Date Time Provider [place]  12/09/2017  9:45 AM Gillis Santa, MD [facility] None   Primary Care Physician: Danelle Berry, NP Location: [facility] Out[facility] Facility Note by: Gillis Santa, MD Date: 11/15/2017; Time: 12:07 PM  Disclaimer:  Medicine is not an exact science. The only guarantee in medicine is that nothing is guaranteed. It is important to note that the decision to proceed with this intervention was based on the information collected from the patient. The Data and conclusions were drawn from the patient's questionnaire, the interview, and the physical examination. Because the information was provided in large part by the patient, it cannot be guaranteed that it has not been purposely or unconsciously manipulated. Every effort has been made to obtain as much relevant data as possible for this evaluation. It is important to note that the conclusions that lead to this procedure are derived in large part from the available data. Always take into account that the treatment will also be dependent on availability of resources and existing treatment guidelines, considered by other Pain Management Practitioners as being common knowledge and practice, at the time of the intervention. For Medico-Legal purposes, it is also important to point out that variation in procedural techniques and pharmacological choices are the acceptable norm. The indications, contraindications, technique, and results of the above procedure should only be interpreted and judged by a Board-Certified Interventional Pain Specialist with extensive familiarity and expertise in the  same exact procedure and technique.

## 2017-11-15 NOTE — Patient Instructions (Signed)

## 2017-11-15 NOTE — Progress Notes (Signed)
Safety precautions to be maintained throughout the outpatient stay will include: orient to surroundings, keep bed in low position, maintain call bell within reach at all times, provide assistance with transfer out of bed and ambulation.  

## 2017-11-16 ENCOUNTER — Telehealth: Payer: Self-pay | Admitting: *Deleted

## 2017-11-16 NOTE — Telephone Encounter (Signed)
Voicemail left with patient to please call our office if there are questions or concerns re; procedure on yesterday.  

## 2017-11-18 ENCOUNTER — Telehealth: Payer: Self-pay | Admitting: Student in an Organized Health Care Education/Training Program

## 2017-11-18 NOTE — Telephone Encounter (Signed)
Patient states that her pain has come back since procedure on Monday.  Informed patient that it has only been 3 days since procedure and she needed to give it up to 10 days for the steroid to start working.  Patient states the first day the pain was gone then it started coming back and on the 3rd day, the pain was just like she was having prior to the procedure. Informed patient that this was normal and to give it time, but to call us back for any further questions or concerns.

## 2017-11-18 NOTE — Telephone Encounter (Signed)
Had procedure on Monday and got this morning and has had bad pain in middle of her back. Would like to speak with Nurse about this.

## 2017-12-09 ENCOUNTER — Ambulatory Visit: Payer: Medicare HMO | Admitting: Student in an Organized Health Care Education/Training Program

## 2017-12-16 ENCOUNTER — Encounter: Payer: Self-pay | Admitting: Student in an Organized Health Care Education/Training Program

## 2017-12-16 ENCOUNTER — Ambulatory Visit
Payer: Medicare HMO | Attending: Student in an Organized Health Care Education/Training Program | Admitting: Student in an Organized Health Care Education/Training Program

## 2017-12-16 VITALS — Temp 98.8°F | Resp 16 | Ht 62.0 in | Wt 195.0 lb

## 2017-12-16 DIAGNOSIS — I10 Essential (primary) hypertension: Secondary | ICD-10-CM | POA: Diagnosis not present

## 2017-12-16 DIAGNOSIS — Z79891 Long term (current) use of opiate analgesic: Secondary | ICD-10-CM | POA: Diagnosis not present

## 2017-12-16 DIAGNOSIS — Z79899 Other long term (current) drug therapy: Secondary | ICD-10-CM | POA: Insufficient documentation

## 2017-12-16 DIAGNOSIS — M5442 Lumbago with sciatica, left side: Secondary | ICD-10-CM | POA: Insufficient documentation

## 2017-12-16 DIAGNOSIS — R1030 Lower abdominal pain, unspecified: Secondary | ICD-10-CM | POA: Diagnosis not present

## 2017-12-16 DIAGNOSIS — M25552 Pain in left hip: Secondary | ICD-10-CM | POA: Diagnosis not present

## 2017-12-16 DIAGNOSIS — M5136 Other intervertebral disc degeneration, lumbar region: Secondary | ICD-10-CM | POA: Diagnosis not present

## 2017-12-16 DIAGNOSIS — Z7984 Long term (current) use of oral hypoglycemic drugs: Secondary | ICD-10-CM | POA: Insufficient documentation

## 2017-12-16 DIAGNOSIS — Z6837 Body mass index (BMI) 37.0-37.9, adult: Secondary | ICD-10-CM | POA: Insufficient documentation

## 2017-12-16 DIAGNOSIS — G894 Chronic pain syndrome: Secondary | ICD-10-CM | POA: Diagnosis not present

## 2017-12-16 DIAGNOSIS — M461 Sacroiliitis, not elsewhere classified: Secondary | ICD-10-CM | POA: Insufficient documentation

## 2017-12-16 DIAGNOSIS — M1288 Other specific arthropathies, not elsewhere classified, other specified site: Secondary | ICD-10-CM | POA: Diagnosis not present

## 2017-12-16 DIAGNOSIS — G8929 Other chronic pain: Secondary | ICD-10-CM

## 2017-12-16 DIAGNOSIS — E119 Type 2 diabetes mellitus without complications: Secondary | ICD-10-CM | POA: Insufficient documentation

## 2017-12-16 DIAGNOSIS — M47818 Spondylosis without myelopathy or radiculopathy, sacral and sacrococcygeal region: Secondary | ICD-10-CM

## 2017-12-16 DIAGNOSIS — Z91041 Radiographic dye allergy status: Secondary | ICD-10-CM | POA: Diagnosis not present

## 2017-12-16 DIAGNOSIS — M5441 Lumbago with sciatica, right side: Secondary | ICD-10-CM

## 2017-12-16 DIAGNOSIS — Z882 Allergy status to sulfonamides status: Secondary | ICD-10-CM | POA: Insufficient documentation

## 2017-12-16 DIAGNOSIS — M47816 Spondylosis without myelopathy or radiculopathy, lumbar region: Secondary | ICD-10-CM | POA: Insufficient documentation

## 2017-12-16 NOTE — Progress Notes (Signed)
Patient's Name: Hayley Knight  MRN: [id number]  Referring Provider: Danelle Berry, NP  DOB: 09/01/1935  PCP: Hayley Berry, NP  DOS: 12/16/2017  Note by: Hayley Santa, MD  Service setting: Ambulatory outpatient  Specialty: Interventional Pain Management  Location: [facility] (AMB) Pain Management Facility    Patient type: Established   Primary Reason(s) for Visit: Encounter for post-procedure evaluation of chronic illness with mild to moderate exacerbation CC: Back Pain (lower, left is worse )  HPI  Hayley Knight is a 82 y.o. year old, female patient, who comes today for a post-procedure evaluation. She has SI joint arthritis; Chronic bilateral low back pain with bilateral sciatica; Lumbar facet arthropathy; Spondylosis without myelopathy or radiculopathy, lumbar region; Lumbar degenerative disc disease; and Class 2 severe obesity due to excess calories with serious comorbidity and body mass index (BMI) of 37.0 to 37.9 in adult [facility]) on their problem list. Her primarily concern today is the Back Pain (lower, left is worse )  Pain Assessment: Location: Left, Right, Lower Back Radiating: into the left hip if she lays on it for very long. Onset: More than a month ago Duration: Chronic pain Quality: Discomfort, Pressure Severity: 0-No pain/10 (subjective, self-reported pain score)  Note: Reported level is compatible with observation.                         When using our objective Pain Scale, levels between 6 and 10/10 are said to belong in an emergency room, as it progressively worsens from a 6/10, described as severely limiting, requiring emergency care not usually available at an outpatient pain management facility. At a 6/10 level, communication becomes difficult and requires great effort. Assistance to reach the emergency department may be required. Facial flushing and profuse sweating along with potentially dangerous increases in heart rate and blood pressure will be evident. Effect on ADL:  patient states that pain is the at its worse when she gets up in the morning and she has to sit on the heating pad in order to get going and that usually helps for the remainder of the day. can only walk short distances.   Timing: Intermittent Modifying factors: heating pad, ice oxycontin  BP:    HR:    Hayley Knight comes in today for post-procedure evaluation after the treatment done on 11/18/2017.  Further details on both, my assessment(s), as well as the proposed treatment plan, please see below.  Post-Procedure Assessment  11/18/2017 Procedure: Bilateral SI-J injection Pre-procedure pain score:  8/10 Post-procedure pain score: 0/10         Influential Factors: BMI: 35.67 kg/m Intra-procedural challenges: None observed.         Assessment challenges: None detected.              Reported side-effects: None.        Post-procedural adverse reactions or complications: None reported         Sedation: Please see nurses note. When no sedatives are used, the analgesic levels obtained are directly associated to the effectiveness of the local anesthetics. However, when sedation is provided, the level of analgesia obtained during the initial 1 hour following the intervention, is believed to be the result of a combination of factors. These factors may include, but are not limited to: 1. The effectiveness of the local anesthetics used. 2. The effects of the analgesic(s) and/or anxiolytic(s) used. 3. The degree of discomfort experienced by the patient at the time of the  procedure. 4. The patients ability and reliability in recalling and recording the events. 5. The presence and influence of possible secondary gains and/or psychosocial factors. Reported result: Relief experienced during the 1st hour after the procedure: 100 % (Ultra-Short Term Relief)            Interpretative annotation: Clinically appropriate result. Analgesia during this period is likely to be Local Anesthetic and/or IV Sedative  (Analgesic/Anxiolytic) related.          Effects of local anesthetic: The analgesic effects attained during this period are directly associated to the localized infiltration of local anesthetics and therefore cary significant diagnostic value as to the etiological location, or anatomical origin, of the pain. Expected duration of relief is directly dependent on the pharmacodynamics of the local anesthetic used. Long-acting (4-6 hours) anesthetics used.  Reported result: Relief during the next 4 to 6 hour after the procedure: 100 % (Short-Term Relief)            Interpretative annotation: Clinically appropriate result. Analgesia during this period is likely to be Local Anesthetic-related.          Long-term benefit: Defined as the period of time past the expected duration of local anesthetics (1 hour for short-acting and 4-6 hours for long-acting). With the possible exception of prolonged sympathetic blockade from the local anesthetics, benefits during this period are typically attributed to, or associated with, other factors such as analgesic sensory neuropraxia, antiinflammatory effects, or beneficial biochemical changes provided by agents other than the local anesthetics.  Reported result: Extended relief following procedure: 40 %(the only improvement patient can determine is the sciatic nerve is not running down the left leg and the left hip does not hurt as much) (Long-Term Relief)            Interpretative annotation: Clinically possible results. Good relief. No permanent benefit expected. Inflammation plays a part in the etiology to the pain.          Current benefits: Defined as reported results that persistent at this point in time.   Analgesia: 25-50 %            Function: Somewhat improved ROM: Somewhat improved Interpretative annotation: Recurrence of symptoms. RIGHT hip/SI J pain better, left hip/buttock/groin pain still persistent but left sided sciatica sx better. No permanent benefit  expected. Results would suggest further treatment needed.          Interpretation: Results would suggest that repeating the procedure may be necessary,                  Plan:  Repeat treatment or therapy and compare extent and duration of benefits.                Laboratory Chemistry  Inflammation Markers (CRP: Acute Phase) (ESR: Chronic Phase) Lab Results  Component Value Date   LATICACIDVEN 1.8 03/17/2015                         Rheumatology Markers No results found for: RF, ANA, LABURIC, URICUR, LYMEIGGIGMAB, LYMEABIGMQN, HLAB27                      Renal Function Markers Lab Results  Component Value Date   BUN 9 03/16/2015   CREATININE 0.97 03/16/2015   GFRAA >60 03/16/2015   GFRNONAA 55 (L) 03/16/2015  Hepatic Function Markers Lab Results  Component Value Date   AST 56 (H) 03/16/2015   ALT 41 03/16/2015   ALBUMIN 4.0 03/16/2015   ALKPHOS 70 03/16/2015                        Electrolytes Lab Results  Component Value Date   NA 135 03/16/2015   K 3.6 03/16/2015   CL 100 (L) 03/16/2015   CALCIUM 9.3 03/16/2015                        Neuropathy Markers Lab Results  Component Value Date   HGBA1C 5.7 03/16/2015                        Bone Pathology Markers No results found for: [place], [id number], [id number], [id number], 25OHVITD1, 25OHVITD2, 25OHVITD3, TESTOFREE, TESTOSTERONE                       Coagulation Parameters Lab Results  Component Value Date   PLT 336 03/18/2015                        Cardiovascular Markers Lab Results  Component Value Date   BNP 141.0 (H) 03/18/2015   CKTOTAL 342 (H) 12/30/2012   TROPONINI < 0.02 06/02/2014   HGB 10.4 (L) 03/18/2015   HCT 31.5 (L) 03/18/2015                         CA Markers No results found for: CEA, CA125, LABCA2                      Note: Lab results reviewed.  Recent Diagnostic Imaging Results  DG C-Arm 1-60 Min-No Report Fluoroscopy was utilized by the requesting  physician.  No radiographic  interpretation.   Complexity Note: Imaging results reviewed. Results shared with Ms. Lesesne, using Layman's terms.                         Meds   Current Outpatient Medications:  .  Cholecalciferol (VITAMIN D3) 2000 units CHEW, Chew 2,000 Units by mouth daily., Disp: , Rfl:  .  colesevelam (WELCHOL) 625 MG tablet, Take 625 mg by mouth 2 (two) times daily. Patient will stop taking if she becomes constipated, Disp: , Rfl:  .  dexlansoprazole (DEXILANT) 60 MG capsule, Take 30 mg by mouth daily. , Disp: , Rfl:  .  diazepam (VALIUM) 5 MG tablet, Take 5 mg by mouth every 12 (twelve) hours as needed for anxiety., Disp: , Rfl:  .  escitalopram (LEXAPRO) 20 MG tablet, Take 20 mg by mouth daily., Disp: , Rfl:  .  fenofibrate (TRICOR) 145 MG tablet, Take 145 mg by mouth daily., Disp: , Rfl:  .  furosemide (LASIX) 20 MG tablet, Take 20 mg by mouth as directed. Take once weekly as needed for fluid retention, Disp: , Rfl:  .  gabapentin (NEURONTIN) 300 MG capsule, Take 300 mg by mouth as needed. , Disp: , Rfl:  .  losartan (COZAAR) 100 MG tablet, Take 100 mg by mouth daily., Disp: , Rfl:  .  metFORMIN (GLUCOPHAGE) 500 MG tablet, Take 500 mg by mouth daily with breakfast., Disp: , Rfl:  .  Multiple Vitamin (MULTIVITAMIN) capsule, Take 1 capsule by mouth daily., Disp: , Rfl:  .  omega-3 acid ethyl esters (LOVAZA) 1 G capsule, Take 2 g by mouth 2 (two) times daily., Disp: , Rfl:  .  oxyCODONE (OXYCONTIN) 10 mg 12 hr tablet, Take 10 mg by mouth 3 (three) times daily. , Disp: , Rfl:  .  potassium chloride (K-DUR) 10 MEQ tablet, Take 10 mEq by mouth daily., Disp: , Rfl: 2 .  ALPRAZolam (XANAX) 0.5 MG tablet, Take 0.5 mg by mouth 2 (two) times daily as needed for anxiety., Disp: , Rfl:  .  cyclobenzaprine (FLEXERIL) 10 MG tablet, Take 10 mg by mouth 3 (three) times daily as needed for muscle spasms., Disp: , Rfl:  .  ezetimibe (ZETIA) 10 MG tablet, Take 10 mg by mouth daily., Disp:  , Rfl:   ROS  Constitutional: Denies any fever or chills Gastrointestinal: No reported hemesis, hematochezia, vomiting, or acute GI distress Musculoskeletal: Denies any acute onset joint swelling, redness, loss of ROM, or weakness Neurological: No reported episodes of acute onset apraxia, aphasia, dysarthria, agnosia, amnesia, paralysis, loss of coordination, or loss of consciousness  Allergies  Ms. Heidel is allergic to ivp dye [iodinated diagnostic agents] and sulfa antibiotics.  [place]  Drug: Ms. Mifsud  reports that she does not use drugs. Alcohol:  reports that she does not drink alcohol. Tobacco:  reports that she has never smoked. She has never used smokeless tobacco. Medical:  has a past medical history of Arthritis, Diabetes mellitus without complication ([place]), and Hypertension. Surgical: Ms. Kinder  has no past surgical history on file. Family: family history is not on file.  Constitutional Exam  General appearance: Well nourished, well developed, and well hydrated. In no apparent acute distress Vitals:   12/16/17 1433  Resp: 16  Temp: 98.8 F (37.1 C)  TempSrc: Oral  SpO2: 96%  Weight: 195 lb (88.5 kg)  Height: '5\' 2"'$  (1.575 m)   BMI Assessment: Estimated body mass index is 35.67 kg/m as calculated from the following:   Height as of this encounter: '5\' 2"'$  (1.575 m).   Weight as of this encounter: 195 lb (88.5 kg).  BMI interpretation table: BMI level Category Range association with higher incidence of chronic pain  <18 kg/m2 Underweight   18.5-24.9 kg/m2 Ideal body weight   25-29.9 kg/m2 Overweight Increased incidence by 20%  30-34.9 kg/m2 Obese (Class I) Increased incidence by 68%  35-39.9 kg/m2 Severe obesity (Class II) Increased incidence by 136%  >40 kg/m2 Extreme obesity (Class III) Increased incidence by 254%   Patient's current BMI Ideal Body weight  Body mass index is 35.67 kg/m. Ideal body weight: 50.1 kg (110 lb 7.2 oz) Adjusted ideal body  weight: 65.4 kg (144 lb 4.3 oz)   BMI Readings from Last 4 Encounters:  12/16/17 35.67 kg/m  11/15/17 37.11 kg/m  11/08/17 37.11 kg/m  03/18/15 37.83 kg/m   Wt Readings from Last 4 Encounters:  12/16/17 195 lb (88.5 kg)  11/15/17 190 lb (86.2 kg)  11/08/17 190 lb (86.2 kg)  03/18/15 187 lb 4.8 oz (85 kg)  Psych/Mental status: Alert, oriented x 3 (person, place, & time)       Eyes: PERLA Respiratory: No evidence of acute respiratory distress  Cervical Spine Area Exam  Skin & Axial Inspection: No masses, redness, edema, swelling, or associated skin lesions Alignment: Symmetrical Functional ROM: Unrestricted ROM      Stability: No instability detected Muscle Tone/Strength: Functionally intact. No obvious neuro-muscular anomalies detected. Sensory (Neurological): Unimpaired Palpation: No palpable anomalies  Upper Extremity (UE) Exam    Side: Right upper extremity  Side: Left upper extremity  Skin & Extremity Inspection: Skin color, temperature, and hair growth are WNL. No peripheral edema or cyanosis. No masses, redness, swelling, asymmetry, or associated skin lesions. No contractures.  Skin & Extremity Inspection: Skin color, temperature, and hair growth are WNL. No peripheral edema or cyanosis. No masses, redness, swelling, asymmetry, or associated skin lesions. No contractures.  Functional ROM: Unrestricted ROM          Functional ROM: Unrestricted ROM          Muscle Tone/Strength: Functionally intact. No obvious neuro-muscular anomalies detected.  Muscle Tone/Strength: Functionally intact. No obvious neuro-muscular anomalies detected.  Sensory (Neurological): Unimpaired          Sensory (Neurological): Unimpaired          Palpation: No palpable anomalies              Palpation: No palpable anomalies              Provocative Test(s):  Phalen's test: deferred Tinel's test: deferred Apley's scratch test (touch opposite shoulder):  Action 1 (Across chest):  deferred Action 2 (Overhead): deferred Action 3 (LB reach): deferred   Provocative Test(s):  Phalen's test: deferred Tinel's test: deferred Apley's scratch test (touch opposite shoulder):  Action 1 (Across chest): deferred Action 2 (Overhead): deferred Action 3 (LB reach): deferred    Thoracic Spine Area Exam  Skin & Axial Inspection: No masses, redness, or swelling Alignment: Symmetrical Functional ROM: Unrestricted ROM Stability: No instability detected Muscle Tone/Strength: Functionally intact. No obvious neuro-muscular anomalies detected. Sensory (Neurological): Unimpaired Muscle strength & Tone: No palpable anomalies  Lumbar Spine Area Exam  Skin & Axial Inspection: No masses, redness, or swelling Alignment: Symmetrical Functional ROM: Decreased ROM affecting primarily the left Stability: No instability detected Muscle Tone/Strength: Functionally intact. No obvious neuro-muscular anomalies detected. Sensory (Neurological): Musculoskeletal pain pattern Palpation: No palpable anomalies       Provocative Tests: Hyperextension/rotation test: (+) on the left for facet joint pain. Lumbar quadrant test (Kemp's test): deferred today       Lateral bending test: deferred today       Patrick's Maneuver: (+) for left-sided S-I arthralgia             FABER test: (+) for left-sided S-I arthralgia             S-I anterior distraction/compression test: deferred today         S-I lateral compression test: deferred today         S-I Thigh-thrust test: deferred today         S-I Gaenslen's test: deferred today          Gait & Posture Assessment  Ambulation: Unassisted Gait: Antalgic Posture: Difficulty standing up straight, due to pain   Lower Extremity Exam    Side: Right lower extremity  Side: Left lower extremity  Stability: No instability observed          Stability: No instability observed          Skin & Extremity Inspection: Skin color, temperature, and hair growth are WNL.  No peripheral edema or cyanosis. No masses, redness, swelling, asymmetry, or associated skin lesions. No contractures.  Skin & Extremity Inspection: Skin color, temperature, and hair growth are WNL. No peripheral edema or cyanosis. No masses, redness, swelling, asymmetry, or associated skin lesions. No contractures.  Functional ROM: Unrestricted ROM  Functional ROM: Unrestricted ROM                  Muscle Tone/Strength: Functionally intact. No obvious neuro-muscular anomalies detected.  Muscle Tone/Strength: Functionally intact. No obvious neuro-muscular anomalies detected.  Sensory (Neurological): Unimpaired  Sensory (Neurological): Musculoskeletal pain pattern  Palpation: No palpable anomalies  Palpation: No palpable anomalies   Assessment  Primary Diagnosis & Pertinent Problem List: The primary encounter diagnosis was SI joint arthritis. Diagnoses of Chronic bilateral low back pain with bilateral sciatica, Lumbar facet arthropathy, Spondylosis without myelopathy or radiculopathy, lumbar region, Lumbar degenerative disc disease, Class 2 severe obesity due to excess calories with serious comorbidity and body mass index (BMI) of 37.0 to 37.9 in adult [facility]), and Chronic pain syndrome were also pertinent to this visit.  Status Diagnosis  Persistent Persistent Persistent 1. SI joint arthritis   2. Chronic bilateral low back pain with bilateral sciatica   3. Lumbar facet arthropathy   4. Spondylosis without myelopathy or radiculopathy, lumbar region   5. Lumbar degenerative disc disease   6. Class 2 severe obesity due to excess calories with serious comorbidity and body mass index (BMI) of 37.0 to 37.9 in adult ([place])   7. Chronic pain syndrome      82 year old female with a history of axial low back pain, bilateral buttock pain, left greater than right, left hip pain and groin pain who presents for follow-up status post bilateral SI joint injections.  Patient states that her  right side is doing well.  She states that she is continued to have low back and buttock pain on her left side.  She states that her sciatic pain that used to run down her posterior thigh and leg has improved since her left SI joint injection.  She is requesting an alternative steroid other than Decadron for her left SI joint injection.  We will repeat left SI joint injection with triamcinolone/Kenalog and will also inject more volume to see if we can extend the duration of her pain relief.  Plan: -Repeat left SI joint injection without sedation.  Use Kenalog as steroid and increase volume.  Lab-work, procedure(s), and/or referral(s): Orders Placed This Encounter  Procedures  . SACROILIAC JOINT INJECTION   Time Note: Greater than 50% of the 25 minute(s) of face-to-face time spent with Ms. Spillane, was spent in counseling/coordination of care regarding: Ms. Paccione primary cause of pain, the treatment plan, treatment alternatives, the risks and possible complications of proposed treatment, going over the informed consent, the results, interpretation and significance of  her recent diagnostic interventional treatment(s), realistic expectations and the goals of pain management (increased in functionality).  Provider-requested follow-up: Return in about 4 weeks (around 01/13/2018) for Procedure.  Future Appointments  Date Time Provider Rafael Capo  01/17/2018 11:30 AM Hayley Santa, MD [facility] None    Primary Care Physician: Hayley Berry, NP Location: [facility] Outpatient Pain Management Facility Note by: Hayley Knight, M.D Date: 12/16/2017; Time: 3:25 PM  Patient Instructions  Sacroiliac (SI) Joint Injection Patient Information  Description: The sacroiliac joint connects the scrum (very low back and tailbone) to the ilium (a pelvic bone which also forms half of the hip joint).  Normally this joint experiences very little motion.  When this joint becomes inflamed or unstable low back  and or hip and pelvis pain may result.  Injection of this joint with local anesthetics (numbing medicines) and steroids can provide diagnostic information and reduce pain.  This injection is performed with the aid  of x-ray guidance into the tailbone area while you are lying on your stomach.   You may experience an electrical sensation down the leg while this is being done.  You may also experience numbness.  We also may ask if we are reproducing your normal pain during the injection.  Conditions which may be treated SI injection:   Low back, buttock, hip or leg pain  Preparation for the Injection:  1. Do not eat any solid food or dairy products within 8 hours of your appointment.  2. You may drink clear liquids up to 3 hours before appointment.  Clear liquids include water, black coffee, juice or soda.  No milk or cream please. 3. You may take your regular medications, including pain medications with a sip of water before your appointment.  Diabetics should hold regular insulin (if take separately) and take 1/2 normal NPH dose the morning of the procedure.  Carry some sugar containing items with you to your appointment. 4. A driver must accompany you and be prepared to drive you home after your procedure. 5. Bring all of your current medications with you. 6. An IV may be inserted and sedation may be given at the discretion of the physician. 7. A blood pressure cuff, EKG and other monitors will often be applied during the procedure.  Some patients may need to have extra oxygen administered for a short period.  8. You will be asked to provide medical information, including your allergies, prior to the procedure.  We must know immediately if you are taking blood thinners (like Coumadin/Warfarin) or if you are allergic to IV iodine contrast (dye).  We must know if you could possible be pregnant.  Possible side effects:   Bleeding from needle site  Infection (rare, may require surgery)  Nerve  injury (rare)  Numbness & tingling (temporary)  A brief convulsion or seizure  Light-headedness (temporary)  Pain at injection site (several days)  Decreased blood pressure (temporary)  Weakness in the leg (temporary)   Call if you experience:   New onset weakness or numbness of an extremity below the injection site that last more than 8 hours.  Hives or difficulty breathing ( go to the emergency room)  Inflammation or drainage at the injection site  Any new symptoms which are concerning to you  Please note:  Although the local anesthetic injected can often make your back/ hip/ buttock/ leg feel good for several hours after the injections, the pain will likely return.  It takes 3-7 days for steroids to work in the sacroiliac area.  You may not notice any pain relief for at least that one week.  If effective, we will often do a series of three injections spaced 3-6 weeks apart to maximally decrease your pain.  After the initial series, we generally will wait some months before a repeat injection of the same type.  If you have any questions, please call [phone] [facility]

## 2017-12-16 NOTE — Patient Instructions (Signed)
Sacroiliac (SI) Joint Injection Patient Information  Description: The sacroiliac joint connects the scrum (very low back and tailbone) to the ilium (a pelvic bone which also forms half of the hip joint).  Normally this joint experiences very little motion.  When this joint becomes inflamed or unstable low back and or hip and pelvis pain may result.  Injection of this joint with local anesthetics (numbing medicines) and steroids can provide diagnostic information and reduce pain.  This injection is performed with the aid of x-ray guidance into the tailbone area while you are lying on your stomach.   You may experience an electrical sensation down the leg while this is being done.  You may also experience numbness.  We also may ask if we are reproducing your normal pain during the injection.  Conditions which may be treated SI injection:   Low back, buttock, hip or leg pain  Preparation for the Injection:  1. Do not eat any solid food or dairy products within 8 hours of your appointment.  2. You may drink clear liquids up to 3 hours before appointment.  Clear liquids include water, black coffee, juice or soda.  No milk or cream please. 3. You may take your regular medications, including pain medications with a sip of water before your appointment.  Diabetics should hold regular insulin (if take separately) and take 1/2 normal NPH dose the morning of the procedure.  Carry some sugar containing items with you to your appointment. 4. A driver must accompany you and be prepared to drive you home after your procedure. 5. Bring all of your current medications with you. 6. An IV may be inserted and sedation may be given at the discretion of the physician. 7. A blood pressure cuff, EKG and other monitors will often be applied during the procedure.  Some patients may need to have extra oxygen administered for a short period.  8. You will be asked to provide medical information, including your allergies,  prior to the procedure.  We must know immediately if you are taking blood thinners (like Coumadin/Warfarin) or if you are allergic to IV iodine contrast (dye).  We must know if you could possible be pregnant.  Possible side effects:   Bleeding from needle site  Infection (rare, may require surgery)  Nerve injury (rare)  Numbness & tingling (temporary)  A brief convulsion or seizure  Light-headedness (temporary)  Pain at injection site (several days)  Decreased blood pressure (temporary)  Weakness in the leg (temporary)   Call if you experience:   New onset weakness or numbness of an extremity below the injection site that last more than 8 hours.  Hives or difficulty breathing ( go to the emergency room)  Inflammation or drainage at the injection site  Any new symptoms which are concerning to you  Please note:  Although the local anesthetic injected can often make your back/ hip/ buttock/ leg feel good for several hours after the injections, the pain will likely return.  It takes 3-7 days for steroids to work in the sacroiliac area.  You may not notice any pain relief for at least that one week.  If effective, we will often do a series of three injections spaced 3-6 weeks apart to maximally decrease your pain.  After the initial series, we generally will wait some months before a repeat injection of the same type.  If you have any questions, please call [phone] [facility]   

## 2018-01-17 ENCOUNTER — Ambulatory Visit (HOSPITAL_BASED_OUTPATIENT_CLINIC_OR_DEPARTMENT_OTHER): Payer: Medicare HMO | Admitting: Student in an Organized Health Care Education/Training Program

## 2018-01-17 ENCOUNTER — Other Ambulatory Visit: Payer: Self-pay

## 2018-01-17 ENCOUNTER — Ambulatory Visit
Admission: RE | Admit: 2018-01-17 | Discharge: 2018-01-17 | Disposition: A | Discharge status: Home / Self Care | Payer: Medicare HMO | Source: Ambulatory Visit | Attending: Student in an Organized Health Care Education/Training Program | Admitting: Student in an Organized Health Care Education/Training Program

## 2018-01-17 ENCOUNTER — Encounter: Payer: Self-pay | Admitting: Student in an Organized Health Care Education/Training Program

## 2018-01-17 VITALS — BP 135/83 | HR 84 | Temp 98.8°F | Resp 19 | Ht 60.0 in | Wt 195.0 lb

## 2018-01-17 DIAGNOSIS — Z882 Allergy status to sulfonamides status: Secondary | ICD-10-CM | POA: Insufficient documentation

## 2018-01-17 DIAGNOSIS — Z794 Long term (current) use of insulin: Secondary | ICD-10-CM | POA: Diagnosis not present

## 2018-01-17 DIAGNOSIS — Z79899 Other long term (current) drug therapy: Secondary | ICD-10-CM | POA: Insufficient documentation

## 2018-01-17 DIAGNOSIS — M461 Sacroiliitis, not elsewhere classified: Secondary | ICD-10-CM | POA: Insufficient documentation

## 2018-01-17 DIAGNOSIS — M47818 Spondylosis without myelopathy or radiculopathy, sacral and sacrococcygeal region: Secondary | ICD-10-CM | POA: Diagnosis not present

## 2018-01-17 MED ORDER — TRIAMCINOLONE ACETONIDE 40 MG/ML IJ SUSP
40.0000 mg | Freq: Once | INTRAMUSCULAR | Status: DC
  Filled 2018-01-17: qty 1

## 2018-01-17 MED ORDER — LIDOCAINE HCL 2 % IJ SOLN
10.0000 mL | Freq: Once | INTRAMUSCULAR | Status: AC
  Administered 2018-01-17: 400 mg
  Filled 2018-01-17: qty 20

## 2018-01-17 MED ORDER — ROPIVACAINE HCL 2 MG/ML IJ SOLN
2.0000 mL | Freq: Once | INTRAMUSCULAR | Status: AC
  Administered 2018-01-17: 10 mL via EPIDURAL
  Filled 2018-01-17: qty 10

## 2018-01-17 NOTE — Progress Notes (Signed)
Patient's Name: Hayley Knight  MRN: [id number]  Referring Provider: Danelle Berry, NP  DOB: 20-Apr-1936  PCP: Danelle Berry, NP  DOS: 01/17/2018  Note by: Gillis Santa, MD  Service setting: Ambulatory outpatient  Specialty: Interventional Pain Management  Patient type: Established  Location: [facility] (AMB) Pain Management Facility  Visit type: Interventional Procedure   Primary Reason for Visit: Interventional Pain Management Treatment. CC: Procedure  Procedure:          Anesthesia, Analgesia, Anxiolysis:  Type: Diagnostic Sacroiliac Joint Steroid Injection #2  Region: Inferior Lumbosacral Region Level: PIIS (Posterior Inferior Iliac Spine) Laterality: Left-Sided  Type: Local Anesthesia Indication(s): Analgesia         Route: Infiltration ([place]/IM) IV Access: Declined Sedation: Declined  Local Anesthetic: Lidocaine 2%   Indications: 1. SI joint arthritis    Pain Score: Pre-procedure: 8 /10  Post-procedure: 0-No pain/10  Pre-op Assessment:  Hayley Knight is a 82 y.o. (year old), female patient, seen today for interventional treatment. She  has no past surgical history on file. Hayley Knight has a current medication list which includes the following prescription(s): vitamin d3, colesevelam, dexlansoprazole, diazepam, escitalopram, furosemide, gabapentin, losartan, metformin, multivitamin, omega-3 acid ethyl esters, oxycodone, potassium chloride, alprazolam, cyclobenzaprine, ezetimibe, and fenofibrate, and the following Facility-Administered Medications: triamcinolone acetonide. Her primarily concern today is the Procedure  Initial Vital Signs:  Pulse/HCG Rate: 84ECG Heart Rate: 80 Temp: 98.8 F (37.1 C) Resp: 16 BP: 117/64 SpO2: 95 %  BMI: Estimated body mass index is 38.08 kg/m as calculated from the following:   Height as of this encounter: 5' (1.524 m).   Weight as of this encounter: 195 lb (88.5 kg).  Risk Assessment: Allergies: Reviewed. She is allergic to ivp dye  [iodinated diagnostic agents] and sulfa antibiotics.  Allergy Precautions: None required Coagulopathies: Reviewed. None identified.  Blood-thinner therapy: None at this time Active Infection(s): Reviewed. None identified. Hayley Knight is afebrile  Site Confirmation: Hayley Knight was asked to confirm the procedure and laterality before marking the site Procedure checklist: Completed Consent: Before the procedure and under the influence of no sedative(s), amnesic(s), or anxiolytics, the patient was informed of the treatment options, risks and possible complications. To fulfill our ethical and legal obligations, as recommended by the American Medical Association's Code of Ethics, I have informed the patient of my clinical impression; the nature and purpose of the treatment or procedure; the risks, benefits, and possible complications of the intervention; the alternatives, including doing nothing; the risk(s) and benefit(s) of the alternative treatment(s) or procedure(s); and the risk(s) and benefit(s) of doing nothing. The patient was provided information about the general risks and possible complications associated with the procedure. These may include, but are not limited to: failure to achieve desired goals, infection, bleeding, organ or nerve damage, allergic reactions, paralysis, and death. In addition, the patient was informed of those risks and complications associated to the procedure, such as failure to decrease pain; infection; bleeding; organ or nerve damage with subsequent damage to sensory, motor, and/or autonomic systems, resulting in permanent pain, numbness, and/or weakness of one or several areas of the body; allergic reactions; (i.e.: anaphylactic reaction); and/or death. Furthermore, the patient was informed of those risks and complications associated with the medications. These include, but are not limited to: allergic reactions (i.e.: anaphylactic or anaphylactoid reaction(s)); adrenal  axis suppression; blood sugar elevation that in diabetics may result in ketoacidosis or comma; water retention that in patients with history of congestive heart failure may result in shortness  of breath, pulmonary edema, and decompensation with resultant heart failure; weight gain; swelling or edema; medication-induced neural toxicity; particulate matter embolism and blood vessel occlusion with resultant organ, and/or nervous system infarction; and/or aseptic necrosis of one or more joints. Finally, the patient was informed that Medicine is not an exact science; therefore, there is also the possibility of unforeseen or unpredictable risks and/or possible complications that may result in a catastrophic outcome. The patient indicated having understood very clearly. We have given the patient no guarantees and we have made no promises. Enough time was given to the patient to ask questions, all of which were answered to the patient's satisfaction. Hayley Knight has indicated that she wanted to continue with the procedure. Attestation: I, the ordering provider, attest that I have discussed with the patient the benefits, risks, side-effects, alternatives, likelihood of achieving goals, and potential problems during recovery for the procedure that I have provided informed consent. Date  Time: 01/17/2018 11:30 AM  Pre-Procedure Preparation:  Monitoring: As per clinic protocol. Respiration, ETCO2, SpO2, BP, heart rate and rhythm monitor placed and checked for adequate function Safety Precautions: Patient was assessed for positional comfort and pressure points before starting the procedure. Time-out: I initiated and conducted the "Time-out" before starting the procedure, as per protocol. The patient was asked to participate by confirming the accuracy of the "Time Out" information. Verification of the correct person, site, and procedure were performed and confirmed by me, the nursing staff, and the patient. "Time-out"  conducted as per Joint Commission's Universal Protocol (UP.01.01.01). Time: 1308  Description of Procedure:          Position: Prone Target Area: Inferior, posterior, aspect of the sacroiliac fissure Approach: Posterior, paraspinal, ipsilateral approach. Area Prepped: Entire Lower Lumbosacral Region Prepping solution: ChloraPrep (2% chlorhexidine gluconate and 70% isopropyl alcohol) Safety Precautions: Aspiration looking for blood return was conducted prior to all injections. At no point did we inject any substances, as a needle was being advanced. No attempts were made at seeking any paresthesias. Safe injection practices and needle disposal techniques used. Medications properly checked for expiration dates. SDV (single dose vial) medications used. Description of the Procedure: Protocol guidelines were followed. The patient was placed in position over the procedure table. The target area was identified and the area prepped in the usual manner. Skin & deeper tissues infiltrated with local anesthetic. Appropriate amount of time allowed to pass for local anesthetics to take effect. The procedure needle was advanced under fluoroscopic guidance into the sacroiliac joint until a firm endpoint was obtained. Proper needle placement secured. Negative aspiration confirmed. Solution injected in intermittent fashion, asking for systemic symptoms every 0.5cc of injectate. The needles were then removed and the area cleansed, making sure to leave some of the prepping solution back to take advantage of its long term bactericidal properties. Vitals:   01/17/18 1148 01/17/18 1309 01/17/18 1312  BP: 117/64 132/67 135/83  Pulse: 84    Resp: 16 11 19   Temp: 98.8 F (37.1 C)    SpO2: 95% 93% 94%  Weight: 195 lb (88.5 kg)    Height: 5' (1.524 m)      Start Time: 1308 hrs. End Time: 1311 hrs. Materials:  Needle(s) Type: Regular needle Gauge: 22G Length: 3.5-in Medication(s): Please see orders for medications  and dosing details. 5 cc solution made of 4 cc of 0.2% ropivacaine, 1 cc of Decadron 10 mg/cc.  2.5 cc injected intra-articular, 2.5 cc injected periarticular on each side. Imaging Guidance (Non-Spinal):  Type of Imaging Technique: Fluoroscopy Guidance (Non-Spinal) Indication(s): Assistance in needle guidance and placement for procedures requiring needle placement in or near specific anatomical locations not easily accessible without such assistance. Exposure Time: Please see nurses notes. Contrast: Before injecting any contrast, we confirmed that the patient did not have an allergy to iodine, shellfish, or radiological contrast. Once satisfactory needle placement was completed at the desired level, radiological contrast was injected. Contrast injected under live fluoroscopy. No contrast complications. See chart for type and volume of contrast used. Fluoroscopic Guidance: I was personally present during the use of fluoroscopy. "Tunnel Vision Technique" used to obtain the best possible view of the target area. Parallax error corrected before commencing the procedure. "Direction-depth-direction" technique used to introduce the needle under continuous pulsed fluoroscopy. Once target was reached, antero-posterior, oblique, and lateral fluoroscopic projection used confirm needle placement in all planes. Images permanently stored in EMR. Interpretation: I personally interpreted the imaging intraoperatively. Adequate needle placement confirmed in multiple planes. Appropriate spread of contrast into desired area was observed. No evidence of afferent or efferent intravascular uptake. Permanent images saved into the patient's record.  Antibiotic Prophylaxis:   Anti-infectives (From admission, onward)   None     Indication(s): None identified  Post-operative Assessment:  Post-procedure Vital Signs:  Pulse/HCG Rate: 8482 Temp: 98.8 F (37.1 C) Resp: 19 BP: 135/83 SpO2: 94 %  EBL:  None  Complications: No immediate post-treatment complications observed by team, or reported by patient.  Note: The patient tolerated the entire procedure well. A repeat set of vitals were taken after the procedure and the patient was kept under observation following institutional policy, for this type of procedure. Post-procedural neurological assessment was performed, showing return to baseline, prior to discharge. The patient was provided with post-procedure discharge instructions, including a section on how to identify potential problems. Should any problems arise concerning this procedure, the patient was given instructions to immediately contact us, at any time, without hesitation. In any case, we plan to contact the patient by telephone for a follow-up status report regarding this interventional procedure.  Comments:  No additional relevant information. 5 out of 5 strength bilateral lower extremity: Plantar flexion, dorsiflexion, knee flexion, knee extension.  Plan of Care   Imaging Orders     DG C-Arm 1-60 Min-No Report Procedure Orders    No procedure(s) ordered today    Medications ordered for procedure: Meds ordered this encounter  Medications  . triamcinolone acetonide (KENALOG-40) injection 40 mg  . ropivacaine (PF) 2 mg/mL (0.2%) (NAROPIN) injection 2 mL  . lidocaine (XYLOCAINE) 2 % (with pres) injection 200 mg   Medications administered: We administered ropivacaine (PF) 2 mg/mL (0.2%) and lidocaine.  See the medical record for exact dosing, route, and time of administration.  New Prescriptions   No medications on file   Disposition: Discharge home  Discharge Date & Time: 01/17/2018; 1320 hrs.   Physician-requested Follow-up: Return in about 6 weeks (around 02/28/2018) for Post Procedure Evaluation.  Future Appointments  Date Time Provider [place]  02/28/2018  1:30 PM Gillis Santa, MD [facility] None  03/15/2018  1:30 PM Lucilla Lame, MD [facility] None    Primary Care Physician: Danelle Berry, NP Location: [facility] Note by: Gillis Santa, MD Date: 01/17/2018; Time: 2:32 PM  Disclaimer:  Medicine is not an exact science. The only guarantee in medicine is that nothing is guaranteed. It is important to note that the decision to proceed with this intervention was based on the  information collected from the patient. The Data and conclusions were drawn from the patient's questionnaire, the interview, and the physical examination. Because the information was provided in large part by the patient, it cannot be guaranteed that it has not been purposely or unconsciously manipulated. Every effort has been made to obtain as much relevant data as possible for this evaluation. It is important to note that the conclusions that lead to this procedure are derived in large part from the available data. Always take into account that the treatment will also be dependent on availability of resources and existing treatment guidelines, considered by other Pain Management Practitioners as being common knowledge and practice, at the time of the intervention. For Medico-Legal purposes, it is also important to point out that variation in procedural techniques and pharmacological choices are the acceptable norm. The indications, contraindications, technique, and results of the above procedure should only be interpreted and judged by a Board-Certified Interventional Pain Specialist with extensive familiarity and expertise in the same exact procedure and technique.

## 2018-01-17 NOTE — Patient Instructions (Signed)

## 2018-01-17 NOTE — Progress Notes (Signed)
Safety precautions to be maintained throughout the outpatient stay will include: orient to surroundings, keep bed in low position, maintain call bell within reach at all times, provide assistance with transfer out of bed and ambulation.  

## 2018-01-18 ENCOUNTER — Telehealth: Payer: Self-pay

## 2018-01-18 NOTE — Telephone Encounter (Signed)
No answer- Left message to call if needed 

## 2018-02-17 ENCOUNTER — Other Ambulatory Visit: Payer: Self-pay | Admitting: Nurse Practitioner

## 2018-02-17 DIAGNOSIS — Z1231 Encounter for screening mammogram for malignant neoplasm of breast: Secondary | ICD-10-CM

## 2018-02-28 ENCOUNTER — Ambulatory Visit: Payer: Medicare HMO | Admitting: Student in an Organized Health Care Education/Training Program

## 2018-03-09 ENCOUNTER — Ambulatory Visit: Payer: Medicare HMO | Admitting: Student in an Organized Health Care Education/Training Program

## 2018-03-15 ENCOUNTER — Ambulatory Visit: Payer: Medicare HMO | Admitting: Gastroenterology

## 2018-03-15 ENCOUNTER — Encounter

## 2018-05-09 DIAGNOSIS — F419 Anxiety disorder, unspecified: Secondary | ICD-10-CM | POA: Insufficient documentation

## 2018-05-09 DIAGNOSIS — E1165 Type 2 diabetes mellitus with hyperglycemia: Secondary | ICD-10-CM

## 2018-05-09 DIAGNOSIS — K219 Gastro-esophageal reflux disease without esophagitis: Secondary | ICD-10-CM | POA: Insufficient documentation

## 2018-05-09 DIAGNOSIS — E119 Type 2 diabetes mellitus without complications: Secondary | ICD-10-CM | POA: Insufficient documentation

## 2018-05-09 HISTORY — DX: Anxiety disorder, unspecified: F41.9

## 2018-05-10 ENCOUNTER — Ambulatory Visit: Payer: Medicare HMO | Admitting: Gastroenterology

## 2018-06-28 ENCOUNTER — Ambulatory Visit: Payer: Medicare HMO | Admitting: Gastroenterology

## 2018-07-04 ENCOUNTER — Ambulatory Visit: Payer: Medicare HMO | Admitting: Urology

## 2018-07-04 ENCOUNTER — Encounter: Payer: Self-pay | Admitting: Urology

## 2018-07-04 VITALS — BP 141/78 | HR 87 | Ht 60.0 in | Wt 206.0 lb

## 2018-07-04 DIAGNOSIS — N3946 Mixed incontinence: Secondary | ICD-10-CM | POA: Diagnosis not present

## 2018-07-04 NOTE — Progress Notes (Signed)
07/04/2018 2:03 PM   Hayley Knight 08/10/1935 [id number]  Referring provider: Danelle Berry, NP [address] [place], [place] [address]  No chief complaint on file.   HPI: I was consulted to assess the patient's prolapse worsening over 1 month.  She can feel vaginal bulging.  She has had a hysterectomy  At baseline she leaks with coughing sneezing and sometimes with bending and lifting.  She has urge incontinence.  She wears usually 1 pad a day.  She denies bedwetting  She voids every 1 or 2 hours and has no nocturia.  Flow varies.  5 minutes later she can double void and increased amount.  She is a borderline diabetic.  Once a months she has jellylike stools.  She has not had bladder surgery or kidney stones.  Presentation not medically treated  Modifying factors: There are no other modifying factors  Associated signs and symptoms: There are no other associated signs and symptoms Aggravating and relieving factors: There are no other aggravating or relieving factors Severity: Moderate Duration: Persistent   PMH: Past Medical History:  Diagnosis Date  . Arthritis   . Diabetes mellitus without complication ([place])   . Hypertension     Surgical History: No past surgical history on file.  Home Medications:  Allergies as of 07/04/2018      Reactions   Ivp Dye [iodinated Diagnostic Agents] Swelling   Sulfa Antibiotics Swelling      Medication List       Accurate as of July 04, 2018  2:03 PM. Always use your most recent med list.        ALPRAZolam 0.5 MG tablet Commonly known as:  XANAX Take 0.5 mg by mouth 2 (two) times daily as needed for anxiety.   colesevelam 625 MG tablet Commonly known as:  WELCHOL Take 625 mg by mouth 2 (two) times daily. Patient will stop taking if she becomes constipated   cyclobenzaprine 10 MG tablet Commonly known as:  FLEXERIL Take 10 mg by mouth 3 (three) times daily as needed for muscle spasms.   dexlansoprazole 60 MG  capsule Commonly known as:  DEXILANT Take 30 mg by mouth daily.   diazepam 5 MG tablet Commonly known as:  VALIUM Take 5 mg by mouth every 12 (twelve) hours as needed for anxiety.   escitalopram 20 MG tablet Commonly known as:  LEXAPRO Take 20 mg by mouth daily.   ezetimibe 10 MG tablet Commonly known as:  ZETIA Take 10 mg by mouth daily.   fenofibrate 145 MG tablet Commonly known as:  TRICOR Take 145 mg by mouth daily.   furosemide 20 MG tablet Commonly known as:  LASIX Take 20 mg by mouth as directed. Take once weekly as needed for fluid retention   gabapentin 300 MG capsule Commonly known as:  NEURONTIN Take 300 mg by mouth as needed.   losartan 100 MG tablet Commonly known as:  COZAAR Take 100 mg by mouth daily.   metFORMIN 500 MG tablet Commonly known as:  GLUCOPHAGE Take 500 mg by mouth daily with breakfast.   multivitamin capsule Take 1 capsule by mouth daily.   omega-3 acid ethyl esters 1 g capsule Commonly known as:  LOVAZA Take 2 g by mouth 2 (two) times daily.   oxyCODONE 10 mg 12 hr tablet Commonly known as:  OXYCONTIN Take 10 mg by mouth 3 (three) times daily.   potassium chloride 10 MEQ tablet Commonly known as:  K-DUR Take 10 mEq by mouth  daily.   Vitamin D3 50 MCG (2000 UT) Chew Chew 2,000 Units by mouth daily.       Allergies:  Allergies  Allergen Reactions  . Ivp Dye [Iodinated Diagnostic Agents] Swelling  . Sulfa Antibiotics Swelling    Family History: No family history on file.  Social History:  reports that she has never smoked. She has never used smokeless tobacco. She reports that she does not drink alcohol or use drugs.  ROS:                                        Physical Exam: There were no vitals taken for this visit.  Constitutional:  Alert and oriented, No acute distress. HEENT: [place] AT, moist mucus membranes.  Trachea midline, no masses. Cardiovascular: No clubbing, cyanosis, or  edema. Respiratory: Normal respiratory effort, no increased work of breathing. GI: Abdomen is soft, nontender, nondistended, no abdominal masses GU: High small grade 2 cystocele and mild smaller grade 2 distal rectocele.  Exam limited by obesity Skin: No rashes, bruises or suspicious lesions. Lymph: No cervical or inguinal adenopathy. Neurologic: Grossly intact, no focal deficits, moving all 4 extremities. Psychiatric: Normal mood and affect.  Laboratory Data: Lab Results  Component Value Date   WBC 6.4 03/18/2015   HGB 10.4 (L) 03/18/2015   HCT 31.5 (L) 03/18/2015   MCV 91.4 03/18/2015   PLT 336 03/18/2015    Lab Results  Component Value Date   CREATININE 0.97 03/16/2015    No results found for: PSA  No results found for: TESTOSTERONE  Lab Results  Component Value Date   HGBA1C 5.7 03/16/2015    Urinalysis    Component Value Date/Time   COLORURINE YELLOW (A) 03/16/2015 2133   APPEARANCEUR CLEAR (A) 03/16/2015 2133   APPEARANCEUR Clear 06/02/2014 1526   LABSPEC 1.010 03/16/2015 2133   LABSPEC 1.015 06/02/2014 1526   PHURINE 6.0 03/16/2015 2133   GLUCOSEU NEGATIVE 03/16/2015 2133   GLUCOSEU Negative 06/02/2014 1526   HGBUR 2+ (A) 03/16/2015 2133   BILIRUBINUR NEGATIVE 03/16/2015 2133   BILIRUBINUR Negative 06/02/2014 [place] 03/16/2015 2133   PROTEINUR NEGATIVE 03/16/2015 2133   NITRITE NEGATIVE 03/16/2015 2133   LEUKOCYTESUR NEGATIVE 03/16/2015 2133   LEUKOCYTESUR Negative 06/02/2014 1526    Pertinent Imaging:   Assessment & Plan: She has prolapse symptoms of mild mixed incontinence.  She has frequency and nocturia.  The patient says she can feel something in the vagina but also thinks there is pressure in the left groin.  A picture was drawn.  She really did not have much prolapse and is not bothersome.  If she ever had surgery I would need to order urodynamics and reexamine her and study the size of the cystocele fluoroscopically.  Watchful  waiting at this stage I think is the good option for her.  There was virtually no cystocele clinically today and again the rectocele was not that impressive either.  I had a fairly lengthy conversation with the patient and had to clarify once again that the left groin feeling when she is laying on her left side and sometimes the watery or jelly stool is not related to the vaginal findings.  She agreed she would not take a medication for her mild incontinence and frequency.  My feeling is that she is not truly feeling vaginal prolapse.  Reassurance given.  See as needed  There are no diagnoses linked to this encounter.  No follow-ups on file.  Reece Packer, MD  [place] [address] [place], [place] [address] [phone]

## 2019-10-10 ENCOUNTER — Other Ambulatory Visit: Payer: Self-pay | Admitting: Nurse Practitioner

## 2019-10-10 DIAGNOSIS — M545 Low back pain, unspecified: Secondary | ICD-10-CM

## 2019-10-31 ENCOUNTER — Other Ambulatory Visit: Payer: Self-pay | Admitting: Nurse Practitioner

## 2019-10-31 DIAGNOSIS — M542 Cervicalgia: Secondary | ICD-10-CM

## 2019-11-02 ENCOUNTER — Ambulatory Visit
Admission: RE | Admit: 2019-11-02 | Discharge: 2019-11-02 | Disposition: A | Discharge status: Home / Self Care | Payer: Medicare Other | Source: Ambulatory Visit | Attending: Nurse Practitioner | Admitting: Nurse Practitioner

## 2019-11-02 ENCOUNTER — Other Ambulatory Visit: Payer: Self-pay

## 2019-11-02 DIAGNOSIS — M542 Cervicalgia: Secondary | ICD-10-CM | POA: Diagnosis present

## 2019-11-02 DIAGNOSIS — M545 Low back pain, unspecified: Secondary | ICD-10-CM

## 2020-08-30 DIAGNOSIS — G8929 Other chronic pain: Secondary | ICD-10-CM | POA: Diagnosis not present

## 2020-08-30 DIAGNOSIS — E1165 Type 2 diabetes mellitus with hyperglycemia: Secondary | ICD-10-CM | POA: Diagnosis not present

## 2020-08-30 DIAGNOSIS — E118 Type 2 diabetes mellitus with unspecified complications: Secondary | ICD-10-CM | POA: Diagnosis not present

## 2020-08-30 DIAGNOSIS — R5383 Other fatigue: Secondary | ICD-10-CM | POA: Diagnosis not present

## 2020-08-30 DIAGNOSIS — I10 Essential (primary) hypertension: Secondary | ICD-10-CM | POA: Diagnosis not present

## 2020-08-30 DIAGNOSIS — E785 Hyperlipidemia, unspecified: Secondary | ICD-10-CM | POA: Diagnosis not present

## 2020-09-03 DIAGNOSIS — Z79891 Long term (current) use of opiate analgesic: Secondary | ICD-10-CM | POA: Diagnosis not present

## 2020-09-11 DIAGNOSIS — M609 Myositis, unspecified: Secondary | ICD-10-CM | POA: Diagnosis not present

## 2020-09-11 DIAGNOSIS — G8929 Other chronic pain: Secondary | ICD-10-CM | POA: Diagnosis not present

## 2020-09-11 DIAGNOSIS — Z79891 Long term (current) use of opiate analgesic: Secondary | ICD-10-CM | POA: Diagnosis not present

## 2020-10-21 DIAGNOSIS — Z79891 Long term (current) use of opiate analgesic: Secondary | ICD-10-CM | POA: Diagnosis not present

## 2020-10-23 DIAGNOSIS — R5383 Other fatigue: Secondary | ICD-10-CM | POA: Diagnosis not present

## 2020-10-23 DIAGNOSIS — I10 Essential (primary) hypertension: Secondary | ICD-10-CM | POA: Diagnosis not present

## 2020-10-23 DIAGNOSIS — D519 Vitamin B12 deficiency anemia, unspecified: Secondary | ICD-10-CM | POA: Diagnosis not present

## 2020-10-23 DIAGNOSIS — E118 Type 2 diabetes mellitus with unspecified complications: Secondary | ICD-10-CM | POA: Diagnosis not present

## 2020-10-23 DIAGNOSIS — E785 Hyperlipidemia, unspecified: Secondary | ICD-10-CM | POA: Diagnosis not present

## 2020-10-25 DIAGNOSIS — R7303 Prediabetes: Secondary | ICD-10-CM | POA: Diagnosis not present

## 2020-10-25 DIAGNOSIS — G8929 Other chronic pain: Secondary | ICD-10-CM | POA: Diagnosis not present

## 2020-10-25 DIAGNOSIS — E1165 Type 2 diabetes mellitus with hyperglycemia: Secondary | ICD-10-CM | POA: Diagnosis not present

## 2020-10-25 DIAGNOSIS — I10 Essential (primary) hypertension: Secondary | ICD-10-CM | POA: Diagnosis not present

## 2020-10-25 DIAGNOSIS — K219 Gastro-esophageal reflux disease without esophagitis: Secondary | ICD-10-CM | POA: Diagnosis not present

## 2020-10-25 DIAGNOSIS — N189 Chronic kidney disease, unspecified: Secondary | ICD-10-CM | POA: Diagnosis not present

## 2020-11-13 DIAGNOSIS — G8929 Other chronic pain: Secondary | ICD-10-CM | POA: Diagnosis not present

## 2020-11-13 DIAGNOSIS — Z79891 Long term (current) use of opiate analgesic: Secondary | ICD-10-CM | POA: Diagnosis not present

## 2020-11-13 DIAGNOSIS — M609 Myositis, unspecified: Secondary | ICD-10-CM | POA: Diagnosis not present

## 2020-11-20 DIAGNOSIS — R519 Headache, unspecified: Secondary | ICD-10-CM | POA: Diagnosis not present

## 2020-11-20 DIAGNOSIS — R0981 Nasal congestion: Secondary | ICD-10-CM | POA: Diagnosis not present

## 2020-11-20 DIAGNOSIS — M791 Myalgia, unspecified site: Secondary | ICD-10-CM | POA: Diagnosis not present

## 2020-11-20 DIAGNOSIS — Z20822 Contact with and (suspected) exposure to covid-19: Secondary | ICD-10-CM | POA: Diagnosis not present

## 2020-11-20 DIAGNOSIS — Z03818 Encounter for observation for suspected exposure to other biological agents ruled out: Secondary | ICD-10-CM | POA: Diagnosis not present

## 2020-11-30 ENCOUNTER — Emergency Department: Payer: Medicare Other

## 2020-11-30 ENCOUNTER — Encounter: Payer: Self-pay | Admitting: Emergency Medicine

## 2020-11-30 ENCOUNTER — Other Ambulatory Visit: Payer: Self-pay

## 2020-11-30 ENCOUNTER — Emergency Department
Admission: EM | Admit: 2020-11-30 | Discharge: 2020-11-30 | Disposition: A | Discharge status: Home / Self Care | Payer: Medicare Other | Attending: Emergency Medicine | Admitting: Emergency Medicine

## 2020-11-30 DIAGNOSIS — I10 Essential (primary) hypertension: Secondary | ICD-10-CM | POA: Insufficient documentation

## 2020-11-30 DIAGNOSIS — M25519 Pain in unspecified shoulder: Secondary | ICD-10-CM | POA: Diagnosis not present

## 2020-11-30 DIAGNOSIS — B309 Viral conjunctivitis, unspecified: Secondary | ICD-10-CM

## 2020-11-30 DIAGNOSIS — Z79899 Other long term (current) drug therapy: Secondary | ICD-10-CM | POA: Diagnosis not present

## 2020-11-30 DIAGNOSIS — M25511 Pain in right shoulder: Secondary | ICD-10-CM | POA: Diagnosis not present

## 2020-11-30 DIAGNOSIS — E119 Type 2 diabetes mellitus without complications: Secondary | ICD-10-CM | POA: Insufficient documentation

## 2020-11-30 DIAGNOSIS — M19011 Primary osteoarthritis, right shoulder: Secondary | ICD-10-CM | POA: Diagnosis not present

## 2020-11-30 DIAGNOSIS — Z743 Need for continuous supervision: Secondary | ICD-10-CM | POA: Diagnosis not present

## 2020-11-30 DIAGNOSIS — Z7982 Long term (current) use of aspirin: Secondary | ICD-10-CM | POA: Diagnosis not present

## 2020-11-30 MED ORDER — OLOPATADINE HCL 0.2 % OP SOLN: 1.0000 [drp] | Freq: Every day | OPHTHALMIC | 0 refills | Status: DC

## 2020-11-30 MED ORDER — CIPROFLOXACIN HCL 0.3 % OP SOLN: 2.0000 [drp] | OPHTHALMIC | 0 refills | Status: AC

## 2020-11-30 NOTE — Discharge Instructions (Addendum)
Please follow up with your primary care provider if not improving over the next few days.  Return to the ER for symptoms that change, worsen, or for new concerns if unable to schedule an appointment.

## 2020-11-30 NOTE — ED Triage Notes (Signed)
Pt reports that she developed right shoulder pain since yesterday, she has osteopetrosis. She took her prednisone. She is also worried about her eye being red, and had "sticky discharge" out of it.  When she took her B/P it was elevated at home and wanted to come to be evaluated.

## 2020-11-30 NOTE — ED Provider Notes (Signed)
[facility] Emergency Department Provider Note ____________________________________________  Time seen: Approximately 3:35 PM  I have reviewed the triage vital signs and the nursing notes.   HISTORY  Chief Complaint Shoulder Pain    HPI Hayley Knight is a 85 y.o. female who presents to the emergency department for evaluation and treatment of right eye redness and drainage. Symptoms started yesterday and then today when she woke up her eye had a "sticky drainage."  Patient is also complaining of right shoulder pain but states that this is her chronic right shoulder pain and denies any change or new injury.  She also reports that her blood pressure was elevated at home and she was afraid that she would have a TIA or stroke if it did not go down.  She did take her medications as prescribed.  She denies chest pain, shortness of breath, or other symptoms of concern.  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   Chronic bilateral low back pain with bilateral sciatica 11/08/2017   Lumbar facet arthropathy 11/08/2017   Spondylosis without myelopathy or radiculopathy, lumbar region 11/08/2017   Lumbar degenerative disc disease 11/08/2017   Class 2 severe obesity due to excess calories with serious comorbidity and body mass index (BMI) of 37.0 to 37.9 in adult [facility]) 11/08/2017   Jaw pain 02/15/2013   Shoulder bursitis 02/15/2013   Essential hypertension 09/30/2012   Left shoulder pain 09/30/2012   Narcotic dependence, episodic use ([place]) 09/30/2012   Fibromyalgia 08/31/2012   OA (osteoarthritis) 08/31/2012    History reviewed. No pertinent surgical history.  Prior to  Admission medications   Medication Sig Start Date End Date Taking? Authorizing Provider  ciprofloxacin (CILOXAN) 0.3 % ophthalmic solution Place 2 drops into the right eye every 2 (two) hours while awake for 5 days. Administer 1 drop, every 2 hours, while awake, for 2 days. Then 1 drop, every 4 hours, while awake, for the next 5 days. 11/30/20 12/05/20 Yes Kemani Heidel B, FNP  Olopatadine HCl (PATADAY) 0.2 % SOLN Apply 1 drop to eye daily. 11/30/20  Yes Latavia Goga B, FNP  ALPRAZolam (XANAX) 0.5 MG tablet Take 0.5 mg by mouth 2 (two) times daily as needed for anxiety.    [provider]  aspirin (ADULT ASPIRIN EC LOW STRENGTH) 81 MG EC tablet Adult Aspirin EC Low Strength 81 MG Oral Tablet Delayed Release QTY: 0 tablet Days: 0 Refills: 0  Written: 03/08/15 Patient Instructions: 03/08/15   [provider]  Cholecalciferol (VITAMIN D3) 2000 units CHEW Chew 2,000 Units by mouth daily.    [provider]  colesevelam (WELCHOL) 625 MG tablet Take 625 mg by mouth 2 (two) times daily. Patient will stop taking if she becomes constipated    [provider]  cyclobenzaprine (FLEXERIL) 10 MG tablet Take 10 mg by mouth 3 (three) times daily as needed for muscle spasms.    [provider]  dexlansoprazole (DEXILANT) 60 MG capsule Take 30 mg by mouth daily.     [provider]  diazepam (VALIUM) 5 MG tablet Take 5 mg by mouth every 12 (twelve) hours as needed for anxiety.    [provider]  diphenoxylate-atropine (  LOMOTIL) 2.5-0.025 MG tablet  06/25/18   [provider]  escitalopram (LEXAPRO) 20 MG tablet Take 20 mg by mouth daily.    [provider]  ezetimibe (ZETIA) 10 MG tablet Take 10 mg by mouth daily.    [provider]  fenofibrate (TRICOR) 145 MG tablet Take 145 mg by mouth daily.    [provider]  FLUoxetine (PROZAC) 20 MG tablet  07/03/18   [provider]  furosemide (LASIX) 20 MG tablet Take 20  mg by mouth as directed. Take once weekly as needed for fluid retention    [provider]  gabapentin (NEURONTIN) 300 MG capsule Take 300 mg by mouth as needed.     [provider]  glipiZIDE (GLUCOTROL XL) 5 MG 24 hr tablet TK 1 T PO QD FOR DIABETES 03/31/18   [provider]  losartan (COZAAR) 100 MG tablet Take 100 mg by mouth daily.    [provider]  metFORMIN (GLUCOPHAGE) 500 MG tablet Take 500 mg by mouth daily with breakfast.    [provider]  Multiple Vitamin (MULTIVITAMIN) capsule Take 1 capsule by mouth daily.    [provider]  omega-3 acid ethyl esters (LOVAZA) 1 G capsule Take 2 g by mouth 2 (two) times daily.    [provider]  oxyCODONE (OXYCONTIN) 10 mg 12 hr tablet Take 10 mg by mouth 3 (three) times daily.     [provider]  potassium chloride (K-DUR) 10 MEQ tablet Take 10 mEq by mouth daily. 11/25/17   [provider]  ranitidine (ZANTAC) 150 MG tablet raNITIdine HCl 150 MG Oral Tablet QTY: 90 tablet Days: 90 Refills: 3  Written: 12/23/17 Patient Instructions: TAKE ONE TABLET BY MOUTH ONCE DAILY ** REPLACING DEXILANT 12/23/17 12/18/18  [provider]    Allergies Ivp dye [iodinated diagnostic agents] and Sulfa antibiotics  No family history on file.  Social History Social History   Tobacco Use   Smoking status: Never   Smokeless tobacco: Never  Substance Use Topics   Alcohol use: No   Drug use: No    Review of Systems Constitutional: Negative for fever. Cardiovascular: Negative for chest pain. Respiratory: Negative for shortness of breath. Musculoskeletal: Positive for right shoulder pain. Skin: Negative for open wounds or lesions. Neurological: Negative for decrease in sensation  ____________________________________________   PHYSICAL EXAM:  VITAL SIGNS: ED Triage Vitals  Enc Vitals Group     BP 11/30/20 1313 (!) 141/75     Pulse Rate 11/30/20 1313 72      Resp 11/30/20 1313 20     Temp 11/30/20 1313 98.3 F (36.8 C)     Temp Source 11/30/20 1313 Oral     SpO2 11/30/20 1313 95 %     Weight 11/30/20 1314 200 lb (90.7 kg)     Height 11/30/20 1314 4' 11.5" (1.511 m)     Head Circumference --      Peak Flow --      Pain Score --      Pain Loc --      Pain Edu? --      Excl. in [place]? --     Constitutional: Alert and oriented. Well appearing and in no acute distress. Eyes: Conjunctiva and the right eye erythematous with mild lower lid edema and clear drainage.  Left eye normal.   Head: Atraumatic Neck: Supple.  No focal midline tenderness. Respiratory: No cough. Respirations are even and unlabored. Musculoskeletal: Able to demonstrate  active range of motion of upper and lower extremities.  No weakness. Neurologic: Awake, alert, oriented x4.  No focal neurodeficits on exam.  Romberg is negative.  Cranial nerves II through XII normal as tested. Skin: No open wounds or lesions. Psychiatric: Affect and behavior are appropriate.  ____________________________________________   LABS (all labs ordered are listed, but only abnormal results are displayed)  Labs Reviewed - No data to display ____________________________________________  RADIOLOGY  Not indicated  I, Sherrie George, personally viewed and evaluated these images (plain radiographs) as part of my medical decision making, as well as reviewing the written report by the radiologist.  DG Shoulder Right  Result Date: 11/30/2020 CLINICAL DATA:  Pain, no injury EXAM: RIGHT SHOULDER - 2+ VIEW COMPARISON:  None. FINDINGS: No fracture or dislocation of the right shoulder. Mild acromioclavicular and glenohumeral arthrosis. The partially imaged right chest is unremarkable. IMPRESSION: 1.  No fracture or dislocation of the right shoulder. 2.  Mild acromioclavicular and glenohumeral arthrosis. Electronically Signed   By: Eddie Candle M.D.   On: 11/30/2020 13:51    ____________________________________________   PROCEDURES  Procedures  ____________________________________________   INITIAL IMPRESSION / ASSESSMENT AND PLAN / ED COURSE  Hayley Knight is a 85 y.o. who presents to the emergency department for treatment and evaluation of symptoms as described in the HPI.  Patient states that she is reassured as her blood pressure is now going down.  She denies symptoms of concern.  Right eye appears to be allergic conjunctivitis and will be treated with Pataday and Cipro drops.  She was encouraged to follow-up with her primary care provider if not improving over the next couple days.  Patient states that the pain in her right shoulder is no worse than usual.  She is under pain management and will continue her medication regimen.  Medications - No data to display  Pertinent labs & imaging results that were available during my care of the patient were reviewed by me and considered in my medical decision making (see chart for details).   _________________________________________   FINAL CLINICAL IMPRESSION(S) / ED DIAGNOSES  Final diagnoses:  Acute viral conjunctivitis of right eye    ED Discharge Orders          Ordered    Olopatadine HCl (PATADAY) 0.2 % SOLN  Daily        11/30/20 1554    ciprofloxacin (CILOXAN) 0.3 % ophthalmic solution  Every 2 hours while awake        11/30/20 1554             If controlled substance prescribed during this visit, 12 month history viewed on the [place] prior to issuing an initial prescription for Schedule II or III opiod.    Victorino Dike, FNP 11/30/20 Cheryll Dessert, MD 12/01/20 [phone]

## 2020-11-30 NOTE — ED Triage Notes (Signed)
Pt c/o right shoulder pain since yesterday. Denies injury per EMS.

## 2020-12-17 DIAGNOSIS — B309 Viral conjunctivitis, unspecified: Secondary | ICD-10-CM | POA: Diagnosis not present

## 2020-12-17 DIAGNOSIS — N189 Chronic kidney disease, unspecified: Secondary | ICD-10-CM | POA: Diagnosis not present

## 2020-12-17 DIAGNOSIS — R5383 Other fatigue: Secondary | ICD-10-CM | POA: Diagnosis not present

## 2020-12-17 DIAGNOSIS — E1165 Type 2 diabetes mellitus with hyperglycemia: Secondary | ICD-10-CM | POA: Diagnosis not present

## 2020-12-17 DIAGNOSIS — F32A Depression, unspecified: Secondary | ICD-10-CM | POA: Diagnosis not present

## 2020-12-17 DIAGNOSIS — I10 Essential (primary) hypertension: Secondary | ICD-10-CM | POA: Diagnosis not present

## 2021-01-07 DIAGNOSIS — M5440 Lumbago with sciatica, unspecified side: Secondary | ICD-10-CM | POA: Diagnosis not present

## 2021-01-07 DIAGNOSIS — Z79891 Long term (current) use of opiate analgesic: Secondary | ICD-10-CM | POA: Diagnosis not present

## 2021-01-08 DIAGNOSIS — G8929 Other chronic pain: Secondary | ICD-10-CM | POA: Diagnosis not present

## 2021-01-08 DIAGNOSIS — Z79891 Long term (current) use of opiate analgesic: Secondary | ICD-10-CM | POA: Diagnosis not present

## 2021-01-08 DIAGNOSIS — M609 Myositis, unspecified: Secondary | ICD-10-CM | POA: Diagnosis not present

## 2021-02-04 DIAGNOSIS — I10 Essential (primary) hypertension: Secondary | ICD-10-CM | POA: Diagnosis not present

## 2021-02-04 DIAGNOSIS — R5383 Other fatigue: Secondary | ICD-10-CM | POA: Diagnosis not present

## 2021-02-04 DIAGNOSIS — N189 Chronic kidney disease, unspecified: Secondary | ICD-10-CM | POA: Diagnosis not present

## 2021-02-04 DIAGNOSIS — E1165 Type 2 diabetes mellitus with hyperglycemia: Secondary | ICD-10-CM | POA: Diagnosis not present

## 2021-02-07 DIAGNOSIS — Z23 Encounter for immunization: Secondary | ICD-10-CM | POA: Diagnosis not present

## 2021-02-07 DIAGNOSIS — I10 Essential (primary) hypertension: Secondary | ICD-10-CM | POA: Diagnosis not present

## 2021-02-07 DIAGNOSIS — N189 Chronic kidney disease, unspecified: Secondary | ICD-10-CM | POA: Diagnosis not present

## 2021-02-07 DIAGNOSIS — E1165 Type 2 diabetes mellitus with hyperglycemia: Secondary | ICD-10-CM | POA: Diagnosis not present

## 2021-02-07 DIAGNOSIS — E785 Hyperlipidemia, unspecified: Secondary | ICD-10-CM | POA: Diagnosis not present

## 2021-02-07 DIAGNOSIS — E1122 Type 2 diabetes mellitus with diabetic chronic kidney disease: Secondary | ICD-10-CM | POA: Diagnosis not present

## 2021-02-24 DIAGNOSIS — G894 Chronic pain syndrome: Secondary | ICD-10-CM | POA: Diagnosis not present

## 2021-03-05 DIAGNOSIS — M542 Cervicalgia: Secondary | ICD-10-CM | POA: Diagnosis not present

## 2021-03-05 DIAGNOSIS — R52 Pain, unspecified: Secondary | ICD-10-CM | POA: Diagnosis not present

## 2021-04-07 DIAGNOSIS — M545 Low back pain, unspecified: Secondary | ICD-10-CM | POA: Diagnosis not present

## 2021-04-07 DIAGNOSIS — K219 Gastro-esophageal reflux disease without esophagitis: Secondary | ICD-10-CM | POA: Diagnosis not present

## 2021-04-07 DIAGNOSIS — R0602 Shortness of breath: Secondary | ICD-10-CM | POA: Diagnosis not present

## 2021-04-07 DIAGNOSIS — N183 Chronic kidney disease, stage 3 unspecified: Secondary | ICD-10-CM | POA: Diagnosis not present

## 2021-04-07 DIAGNOSIS — I208 Other forms of angina pectoris: Secondary | ICD-10-CM | POA: Diagnosis not present

## 2021-04-07 DIAGNOSIS — I10 Essential (primary) hypertension: Secondary | ICD-10-CM | POA: Diagnosis not present

## 2021-04-07 DIAGNOSIS — G8929 Other chronic pain: Secondary | ICD-10-CM | POA: Diagnosis not present

## 2021-04-07 DIAGNOSIS — Z91041 Radiographic dye allergy status: Secondary | ICD-10-CM | POA: Diagnosis not present

## 2021-04-07 DIAGNOSIS — E119 Type 2 diabetes mellitus without complications: Secondary | ICD-10-CM | POA: Diagnosis not present

## 2021-04-14 DIAGNOSIS — G894 Chronic pain syndrome: Secondary | ICD-10-CM | POA: Diagnosis not present

## 2021-04-22 DIAGNOSIS — R0602 Shortness of breath: Secondary | ICD-10-CM | POA: Diagnosis not present

## 2021-04-30 DIAGNOSIS — M25521 Pain in right elbow: Secondary | ICD-10-CM | POA: Diagnosis not present

## 2021-04-30 DIAGNOSIS — R52 Pain, unspecified: Secondary | ICD-10-CM | POA: Diagnosis not present

## 2021-04-30 DIAGNOSIS — M542 Cervicalgia: Secondary | ICD-10-CM | POA: Diagnosis not present

## 2021-04-30 DIAGNOSIS — M25511 Pain in right shoulder: Secondary | ICD-10-CM | POA: Diagnosis not present

## 2021-05-07 ENCOUNTER — Other Ambulatory Visit
Admission: RE | Admit: 2021-05-07 | Discharge: 2021-05-07 | Disposition: A | Discharge status: Home / Self Care | Payer: Medicare Other | Source: Ambulatory Visit | Attending: Internal Medicine | Admitting: Internal Medicine

## 2021-05-07 DIAGNOSIS — K219 Gastro-esophageal reflux disease without esophagitis: Secondary | ICD-10-CM | POA: Diagnosis not present

## 2021-05-07 DIAGNOSIS — R0602 Shortness of breath: Secondary | ICD-10-CM | POA: Diagnosis not present

## 2021-05-07 DIAGNOSIS — Z01818 Encounter for other preprocedural examination: Secondary | ICD-10-CM | POA: Insufficient documentation

## 2021-05-07 DIAGNOSIS — I10 Essential (primary) hypertension: Secondary | ICD-10-CM | POA: Diagnosis not present

## 2021-05-07 DIAGNOSIS — Z79899 Other long term (current) drug therapy: Secondary | ICD-10-CM | POA: Diagnosis not present

## 2021-05-07 DIAGNOSIS — M545 Low back pain, unspecified: Secondary | ICD-10-CM | POA: Diagnosis not present

## 2021-05-07 DIAGNOSIS — Z91041 Radiographic dye allergy status: Secondary | ICD-10-CM | POA: Diagnosis not present

## 2021-05-07 DIAGNOSIS — N183 Chronic kidney disease, stage 3 unspecified: Secondary | ICD-10-CM | POA: Diagnosis not present

## 2021-05-07 DIAGNOSIS — E119 Type 2 diabetes mellitus without complications: Secondary | ICD-10-CM | POA: Diagnosis not present

## 2021-05-07 DIAGNOSIS — I208 Other forms of angina pectoris: Secondary | ICD-10-CM | POA: Diagnosis not present

## 2021-05-07 LAB — BRAIN NATRIURETIC PEPTIDE: B Natriuretic Peptide: 63.5 pg/mL (ref 0.0–100.0)

## 2021-05-15 DIAGNOSIS — E785 Hyperlipidemia, unspecified: Secondary | ICD-10-CM | POA: Diagnosis not present

## 2021-05-15 DIAGNOSIS — I10 Essential (primary) hypertension: Secondary | ICD-10-CM | POA: Diagnosis not present

## 2021-05-15 DIAGNOSIS — N189 Chronic kidney disease, unspecified: Secondary | ICD-10-CM | POA: Diagnosis not present

## 2021-05-15 DIAGNOSIS — E1165 Type 2 diabetes mellitus with hyperglycemia: Secondary | ICD-10-CM | POA: Diagnosis not present

## 2021-05-19 DIAGNOSIS — F32A Depression, unspecified: Secondary | ICD-10-CM | POA: Diagnosis not present

## 2021-05-19 DIAGNOSIS — R7303 Prediabetes: Secondary | ICD-10-CM | POA: Diagnosis not present

## 2021-05-19 DIAGNOSIS — J329 Chronic sinusitis, unspecified: Secondary | ICD-10-CM | POA: Diagnosis not present

## 2021-05-19 DIAGNOSIS — E785 Hyperlipidemia, unspecified: Secondary | ICD-10-CM | POA: Diagnosis not present

## 2021-05-19 DIAGNOSIS — I10 Essential (primary) hypertension: Secondary | ICD-10-CM | POA: Diagnosis not present

## 2021-05-20 ENCOUNTER — Ambulatory Visit: Admission: RE | Admit: 2021-05-20 | Payer: Medicare Other | Source: Home / Self Care | Admitting: Internal Medicine

## 2021-05-20 ENCOUNTER — Encounter: Admission: RE | Payer: Self-pay | Source: Home / Self Care

## 2021-05-20 DIAGNOSIS — R0602 Shortness of breath: Secondary | ICD-10-CM

## 2021-05-20 DIAGNOSIS — I200 Unstable angina: Secondary | ICD-10-CM

## 2021-05-20 DIAGNOSIS — I509 Heart failure, unspecified: Secondary | ICD-10-CM

## 2021-05-20 SURGERY — RIGHT/LEFT HEART CATH AND CORONARY ANGIOGRAPHY
Anesthesia: Moderate Sedation

## 2021-05-21 DIAGNOSIS — X32XXXA Exposure to sunlight, initial encounter: Secondary | ICD-10-CM | POA: Diagnosis not present

## 2021-05-21 DIAGNOSIS — L308 Other specified dermatitis: Secondary | ICD-10-CM | POA: Diagnosis not present

## 2021-05-21 DIAGNOSIS — D225 Melanocytic nevi of trunk: Secondary | ICD-10-CM | POA: Diagnosis not present

## 2021-05-21 DIAGNOSIS — L570 Actinic keratosis: Secondary | ICD-10-CM | POA: Diagnosis not present

## 2021-05-27 ENCOUNTER — Other Ambulatory Visit (HOSPITAL_COMMUNITY): Payer: Self-pay | Admitting: Internal Medicine

## 2021-05-27 DIAGNOSIS — I208 Other forms of angina pectoris: Secondary | ICD-10-CM

## 2021-06-03 ENCOUNTER — Telehealth (HOSPITAL_COMMUNITY): Payer: Self-pay | Admitting: *Deleted

## 2021-06-03 ENCOUNTER — Other Ambulatory Visit (HOSPITAL_COMMUNITY): Payer: Self-pay | Admitting: *Deleted

## 2021-06-03 MED ORDER — METOPROLOL TARTRATE 100 MG PO TABS: ORAL_TABLET | ORAL | 0 refills | Status: DC

## 2021-06-03 NOTE — Telephone Encounter (Signed)
Reaching out to patient to offer assistance regarding upcoming cardiac imaging study; pt verbalizes understanding of appt date/time, parking situation and where to check in, pre-test NPO status and medications ordered, and verified current allergies; name and call back number provided for further questions should they arise  Gordy Clement RN Navigator Cardiac [place] [facility] [phone] office [phone] cell  Review how to take 13 hour prep and metoprolol tartrate with patient. She verbalized understanding and is aware to arrive at 12:30pm for her 1pm scan with a ride.

## 2021-06-06 ENCOUNTER — Telehealth (HOSPITAL_COMMUNITY): Payer: Self-pay | Admitting: *Deleted

## 2021-06-06 ENCOUNTER — Encounter (HOSPITAL_COMMUNITY): Payer: Self-pay

## 2021-06-06 ENCOUNTER — Ambulatory Visit (HOSPITAL_COMMUNITY): Admission: RE | Admit: 2021-06-06 | Payer: Medicare Other | Source: Ambulatory Visit

## 2021-06-06 NOTE — Telephone Encounter (Signed)
Received call from patient stating she would like to cancel her CCTA today because she just found out her son past away.  I offered to reschedule her but she was requested to be called back next Wednesday, February 16. Informed her that the scheduler would reach out then to reschedule. She states she still has all of her medications for her test and is aware we will review instructions closer to her new CCTA date.  Gordy Clement RN Navigator Cardiac Imaging [facility] [phone] Office [phone] Cell

## 2021-06-10 ENCOUNTER — Other Ambulatory Visit (HOSPITAL_COMMUNITY): Payer: Self-pay | Admitting: *Deleted

## 2021-06-10 DIAGNOSIS — Z01812 Encounter for preprocedural laboratory examination: Secondary | ICD-10-CM

## 2021-06-18 ENCOUNTER — Telehealth (HOSPITAL_COMMUNITY): Payer: Self-pay | Admitting: Emergency Medicine

## 2021-06-18 DIAGNOSIS — N189 Chronic kidney disease, unspecified: Secondary | ICD-10-CM | POA: Diagnosis not present

## 2021-06-18 NOTE — Telephone Encounter (Signed)
Reaching out to patient to offer assistance regarding upcoming cardiac imaging study; pt verbalizes understanding of appt date/time, parking situation and where to check in, pre-test NPO status and medications ordered, and verified current allergies; name and call back number provided for further questions should they arise Marchia Bond RN Navigator Cardiac Imaging [facility] [phone] office [phone] cell  Reviewed times to take 13 hr prep and metoprolol Reminded patient to get labs from [facility] (states she has appt tomorrow at 2p for BMP) Arrival time 200

## 2021-06-18 NOTE — Telephone Encounter (Signed)
Attempted to call patient regarding upcoming cardiac CT appointment. Left message on voicemail with name and callback number Marchia Bond RN Navigator Cardiac Imaging [facility] [phone] Office [phone] Cell  Made patient aware in message that labs were needed  Called [facility] to have nurse place BMP order and attempt to contact patient to have drawn prior to scan

## 2021-06-19 DIAGNOSIS — Z79899 Other long term (current) drug therapy: Secondary | ICD-10-CM | POA: Diagnosis not present

## 2021-06-19 DIAGNOSIS — I10 Essential (primary) hypertension: Secondary | ICD-10-CM | POA: Diagnosis not present

## 2021-06-19 DIAGNOSIS — E119 Type 2 diabetes mellitus without complications: Secondary | ICD-10-CM | POA: Diagnosis not present

## 2021-06-20 ENCOUNTER — Encounter (HOSPITAL_COMMUNITY): Payer: Self-pay

## 2021-06-20 ENCOUNTER — Ambulatory Visit (HOSPITAL_COMMUNITY)
Admission: RE | Admit: 2021-06-20 | Discharge: 2021-06-20 | Disposition: A | Discharge status: Home / Self Care | Payer: Medicare Other | Source: Ambulatory Visit | Attending: Internal Medicine | Admitting: Internal Medicine

## 2021-06-20 ENCOUNTER — Other Ambulatory Visit: Payer: Self-pay

## 2021-06-20 ENCOUNTER — Other Ambulatory Visit: Payer: Self-pay | Admitting: Internal Medicine

## 2021-06-20 DIAGNOSIS — I2510 Atherosclerotic heart disease of native coronary artery without angina pectoris: Secondary | ICD-10-CM | POA: Diagnosis not present

## 2021-06-20 DIAGNOSIS — I208 Other forms of angina pectoris: Secondary | ICD-10-CM | POA: Insufficient documentation

## 2021-06-20 DIAGNOSIS — R931 Abnormal findings on diagnostic imaging of heart and coronary circulation: Secondary | ICD-10-CM | POA: Diagnosis not present

## 2021-06-20 MED ORDER — NITROGLYCERIN 0.4 MG SL SUBL
SUBLINGUAL_TABLET | SUBLINGUAL | Status: AC
  Administered 2021-06-20: 0.8 mg via SUBLINGUAL
  Filled 2021-06-20: qty 2

## 2021-06-20 MED ORDER — METOPROLOL TARTRATE 5 MG/5ML IV SOLN
INTRAVENOUS | Status: AC
  Filled 2021-06-20: qty 5

## 2021-06-20 MED ORDER — NITROGLYCERIN 0.4 MG SL SUBL: 0.8000 mg | SUBLINGUAL_TABLET | Freq: Once | SUBLINGUAL | Status: AC

## 2021-06-20 MED ORDER — IOHEXOL 350 MG/ML SOLN
100.0000 mL | Freq: Once | INTRAVENOUS | Status: AC | PRN
  Administered 2021-06-20: 100 mL via INTRAVENOUS

## 2021-06-20 NOTE — Progress Notes (Unsigned)
Please send for FFR - Dr. Kingsley Farace 

## 2021-06-21 ENCOUNTER — Ambulatory Visit (HOSPITAL_COMMUNITY)
Admission: RE | Admit: 2021-06-21 | Discharge: 2021-06-21 | Disposition: A | Discharge status: Home / Self Care | Payer: Medicare Other | Source: Ambulatory Visit | Attending: Internal Medicine | Admitting: Internal Medicine

## 2021-06-21 DIAGNOSIS — R931 Abnormal findings on diagnostic imaging of heart and coronary circulation: Secondary | ICD-10-CM | POA: Diagnosis not present

## 2021-06-21 DIAGNOSIS — I208 Other forms of angina pectoris: Secondary | ICD-10-CM | POA: Diagnosis not present

## 2021-06-23 ENCOUNTER — Other Ambulatory Visit: Payer: Self-pay | Admitting: Nurse Practitioner

## 2021-06-24 ENCOUNTER — Other Ambulatory Visit: Payer: Self-pay | Admitting: Nurse Practitioner

## 2021-06-25 ENCOUNTER — Other Ambulatory Visit
Admission: RE | Admit: 2021-06-25 | Discharge: 2021-06-25 | Disposition: A | Discharge status: Home / Self Care | Payer: Medicare Other | Source: Ambulatory Visit | Attending: Student | Admitting: Student

## 2021-06-25 DIAGNOSIS — N1831 Chronic kidney disease, stage 3a: Secondary | ICD-10-CM | POA: Diagnosis not present

## 2021-06-25 DIAGNOSIS — E119 Type 2 diabetes mellitus without complications: Secondary | ICD-10-CM | POA: Diagnosis not present

## 2021-06-25 DIAGNOSIS — Z634 Disappearance and death of family member: Secondary | ICD-10-CM | POA: Diagnosis not present

## 2021-06-25 DIAGNOSIS — I10 Essential (primary) hypertension: Secondary | ICD-10-CM | POA: Diagnosis not present

## 2021-06-25 DIAGNOSIS — I25110 Atherosclerotic heart disease of native coronary artery with unstable angina pectoris: Secondary | ICD-10-CM | POA: Insufficient documentation

## 2021-06-25 DIAGNOSIS — Z01818 Encounter for other preprocedural examination: Secondary | ICD-10-CM | POA: Insufficient documentation

## 2021-06-25 DIAGNOSIS — I208 Other forms of angina pectoris: Secondary | ICD-10-CM | POA: Diagnosis not present

## 2021-06-25 LAB — BRAIN NATRIURETIC PEPTIDE: B Natriuretic Peptide: 15 pg/mL (ref 0.0–100.0)

## 2021-07-09 ENCOUNTER — Observation Stay
Admission: RE | Admit: 2021-07-09 | Discharge: 2021-07-11 | Disposition: A | Discharge status: Other Acute Inpatient Hospital | Payer: Medicare Other | Source: Ambulatory Visit | Attending: Internal Medicine | Admitting: Internal Medicine

## 2021-07-09 ENCOUNTER — Other Ambulatory Visit: Payer: Self-pay

## 2021-07-09 ENCOUNTER — Encounter
Admission: RE | Disposition: A | Discharge status: Other Acute Inpatient Hospital | Payer: Self-pay | Source: Ambulatory Visit | Attending: Internal Medicine

## 2021-07-09 ENCOUNTER — Encounter: Payer: Self-pay | Admitting: Internal Medicine

## 2021-07-09 ENCOUNTER — Ambulatory Visit: Payer: Medicare HMO | Admitting: Urology

## 2021-07-09 DIAGNOSIS — Z7984 Long term (current) use of oral hypoglycemic drugs: Secondary | ICD-10-CM | POA: Insufficient documentation

## 2021-07-09 DIAGNOSIS — Z853 Personal history of malignant neoplasm of breast: Secondary | ICD-10-CM | POA: Diagnosis not present

## 2021-07-09 DIAGNOSIS — N1831 Chronic kidney disease, stage 3a: Secondary | ICD-10-CM | POA: Diagnosis not present

## 2021-07-09 DIAGNOSIS — I129 Hypertensive chronic kidney disease with stage 1 through stage 4 chronic kidney disease, or unspecified chronic kidney disease: Secondary | ICD-10-CM | POA: Insufficient documentation

## 2021-07-09 DIAGNOSIS — I2510 Atherosclerotic heart disease of native coronary artery without angina pectoris: Principal | ICD-10-CM | POA: Insufficient documentation

## 2021-07-09 DIAGNOSIS — Z79899 Other long term (current) drug therapy: Secondary | ICD-10-CM | POA: Insufficient documentation

## 2021-07-09 DIAGNOSIS — R0600 Dyspnea, unspecified: Secondary | ICD-10-CM | POA: Diagnosis present

## 2021-07-09 DIAGNOSIS — I200 Unstable angina: Secondary | ICD-10-CM

## 2021-07-09 DIAGNOSIS — E1122 Type 2 diabetes mellitus with diabetic chronic kidney disease: Secondary | ICD-10-CM | POA: Diagnosis not present

## 2021-07-09 DIAGNOSIS — Z7982 Long term (current) use of aspirin: Secondary | ICD-10-CM | POA: Insufficient documentation

## 2021-07-09 DIAGNOSIS — I209 Angina pectoris, unspecified: Secondary | ICD-10-CM | POA: Diagnosis not present

## 2021-07-09 DIAGNOSIS — I5033 Acute on chronic diastolic (congestive) heart failure: Secondary | ICD-10-CM | POA: Diagnosis not present

## 2021-07-09 DIAGNOSIS — R0602 Shortness of breath: Secondary | ICD-10-CM | POA: Diagnosis not present

## 2021-07-09 HISTORY — DX: Dyspnea, unspecified: R06.00

## 2021-07-09 HISTORY — PX: RIGHT/LEFT HEART CATH AND CORONARY ANGIOGRAPHY: CATH118266

## 2021-07-09 LAB — GLUCOSE, CAPILLARY: Glucose-Capillary: 151 mg/dL — ABNORMAL HIGH (ref 70–99)

## 2021-07-09 LAB — CARDIAC CATHETERIZATION: Cath EF Quantitative: 55 %

## 2021-07-09 SURGERY — RIGHT/LEFT HEART CATH AND CORONARY ANGIOGRAPHY
Anesthesia: Moderate Sedation | Laterality: Bilateral

## 2021-07-09 MED ORDER — FAMOTIDINE 20 MG PO TABS
ORAL_TABLET | ORAL | Status: AC
  Filled 2021-07-09: qty 1

## 2021-07-09 MED ORDER — OMEGA-3-ACID ETHYL ESTERS 1 G PO CAPS
2.0000 g | ORAL_CAPSULE | Freq: Two times a day (BID) | ORAL | Status: DC
  Administered 2021-07-09 – 2021-07-10 (×3): 2 g via ORAL
  Filled 2021-07-09 (×3): qty 2

## 2021-07-09 MED ORDER — ADULT MULTIVITAMIN W/MINERALS CH
1.0000 | ORAL_TABLET | Freq: Every day | ORAL | Status: DC
  Administered 2021-07-10: 1 via ORAL
  Filled 2021-07-09: qty 1

## 2021-07-09 MED ORDER — SODIUM CHLORIDE 0.9 % IV SOLN: 250.0000 mL | INTRAVENOUS | Status: DC | PRN

## 2021-07-09 MED ORDER — FENTANYL CITRATE (PF) 100 MCG/2ML IJ SOLN
INTRAMUSCULAR | Status: DC | PRN
Start: 2021-07-09 — End: 2021-07-09
  Administered 2021-07-09: 25 ug via INTRAVENOUS

## 2021-07-09 MED ORDER — MIDAZOLAM HCL 2 MG/2ML IJ SOLN
INTRAMUSCULAR | Status: AC
  Filled 2021-07-09: qty 2

## 2021-07-09 MED ORDER — ASPIRIN EC 81 MG PO TBEC
81.0000 mg | DELAYED_RELEASE_TABLET | Freq: Every day | ORAL | Status: DC
  Administered 2021-07-09 – 2021-07-10 (×2): 81 mg via ORAL
  Filled 2021-07-09 (×3): qty 1

## 2021-07-09 MED ORDER — HEPARIN (PORCINE) IN NACL 1000-0.9 UT/500ML-% IV SOLN
INTRAVENOUS | Status: AC
  Filled 2021-07-09: qty 1000

## 2021-07-09 MED ORDER — POTASSIUM CHLORIDE CRYS ER 10 MEQ PO TBCR
10.0000 meq | EXTENDED_RELEASE_TABLET | Freq: Every day | ORAL | Status: DC
  Administered 2021-07-10: 10 meq via ORAL
  Filled 2021-07-09: qty 1

## 2021-07-09 MED ORDER — ESCITALOPRAM OXALATE 20 MG PO TABS
20.0000 mg | ORAL_TABLET | Freq: Every day | ORAL | Status: DC
Start: 2021-07-10 — End: 2021-07-11
  Administered 2021-07-10: 20 mg via ORAL
  Filled 2021-07-09 (×2): qty 1

## 2021-07-09 MED ORDER — FUROSEMIDE 40 MG PO TABS
40.0000 mg | ORAL_TABLET | Freq: Every day | ORAL | Status: DC
  Administered 2021-07-10: 40 mg via ORAL
  Filled 2021-07-09: qty 1

## 2021-07-09 MED ORDER — LIDOCAINE HCL (PF) 1 % IJ SOLN
INTRAMUSCULAR | Status: DC | PRN
  Administered 2021-07-09: 20 mL

## 2021-07-09 MED ORDER — AMLODIPINE BESYLATE 5 MG PO TABS
2.5000 mg | ORAL_TABLET | Freq: Every day | ORAL | Status: DC
Start: 2021-07-10 — End: 2021-07-11
  Administered 2021-07-10: 2.5 mg via ORAL
  Filled 2021-07-09: qty 1

## 2021-07-09 MED ORDER — ACETAMINOPHEN 325 MG PO TABS: 650.0000 mg | ORAL_TABLET | ORAL | Status: DC | PRN

## 2021-07-09 MED ORDER — GABAPENTIN 300 MG PO CAPS
300.0000 mg | ORAL_CAPSULE | Freq: Every day | ORAL | Status: DC
  Administered 2021-07-09 – 2021-07-10 (×2): 300 mg via ORAL
  Filled 2021-07-09 (×2): qty 1

## 2021-07-09 MED ORDER — SODIUM CHLORIDE 0.9% FLUSH
3.0000 mL | Freq: Two times a day (BID) | INTRAVENOUS | Status: DC
  Administered 2021-07-09 – 2021-07-10 (×2): 3 mL via INTRAVENOUS

## 2021-07-09 MED ORDER — PANTOPRAZOLE SODIUM 40 MG PO TBEC
40.0000 mg | DELAYED_RELEASE_TABLET | Freq: Every day | ORAL | Status: DC
  Administered 2021-07-10: 40 mg via ORAL
  Filled 2021-07-09: qty 1

## 2021-07-09 MED ORDER — OXYCODONE HCL ER 10 MG PO T12A: 20.0000 mg | EXTENDED_RELEASE_TABLET | Freq: Every morning | ORAL | Status: DC

## 2021-07-09 MED ORDER — IOHEXOL 300 MG/ML  SOLN
INTRAMUSCULAR | Status: DC | PRN
  Administered 2021-07-09: 68 mL

## 2021-07-09 MED ORDER — LINAGLIPTIN 5 MG PO TABS
5.0000 mg | ORAL_TABLET | Freq: Every day | ORAL | Status: DC
  Administered 2021-07-10: 5 mg via ORAL
  Filled 2021-07-09 (×3): qty 1

## 2021-07-09 MED ORDER — SODIUM CHLORIDE 0.9% FLUSH: 3.0000 mL | INTRAVENOUS | Status: DC | PRN

## 2021-07-09 MED ORDER — ONE A DAY IMMUNITY DEFENSE PO CHEW
CHEWABLE_TABLET | Freq: Every day | ORAL | Status: DC
Start: 2021-07-09 — End: 2021-07-09

## 2021-07-09 MED ORDER — METHYLPREDNISOLONE SODIUM SUCC 125 MG IJ SOLR: 125.0000 mg | Freq: Once | INTRAMUSCULAR | Status: DC

## 2021-07-09 MED ORDER — METHYLPREDNISOLONE SODIUM SUCC 125 MG IJ SOLR
INTRAMUSCULAR | Status: AC
  Filled 2021-07-09: qty 2

## 2021-07-09 MED ORDER — LIDOCAINE HCL 1 % IJ SOLN
INTRAMUSCULAR | Status: AC
  Filled 2021-07-09: qty 20

## 2021-07-09 MED ORDER — ASPIRIN 81 MG PO CHEW
CHEWABLE_TABLET | ORAL | Status: AC
  Administered 2021-07-09: 81 mg via ORAL
  Filled 2021-07-09: qty 1

## 2021-07-09 MED ORDER — LOSARTAN POTASSIUM 50 MG PO TABS
100.0000 mg | ORAL_TABLET | Freq: Every day | ORAL | Status: DC
  Administered 2021-07-10: 100 mg via ORAL
  Filled 2021-07-09: qty 2

## 2021-07-09 MED ORDER — SODIUM CHLORIDE 0.9 % WEIGHT BASED INFUSION: 3.0000 mL/kg/h | INTRAVENOUS | Status: AC

## 2021-07-09 MED ORDER — FENOFIBRATE 160 MG PO TABS
160.0000 mg | ORAL_TABLET | Freq: Every day | ORAL | Status: DC
  Administered 2021-07-10: 160 mg via ORAL
  Filled 2021-07-09 (×3): qty 1

## 2021-07-09 MED ORDER — HEPARIN (PORCINE) IN NACL 1000-0.9 UT/500ML-% IV SOLN
INTRAVENOUS | Status: DC | PRN
  Administered 2021-07-09: 1000 mL

## 2021-07-09 MED ORDER — METHYLPREDNISOLONE SODIUM SUCC 125 MG IJ SOLR
INTRAMUSCULAR | Status: DC | PRN
Start: 2021-07-09 — End: 2021-07-09
  Administered 2021-07-09: 62.5 mg via INTRAVENOUS

## 2021-07-09 MED ORDER — DIPHENHYDRAMINE HCL 50 MG/ML IJ SOLN
INTRAMUSCULAR | Status: AC
  Filled 2021-07-09: qty 1

## 2021-07-09 MED ORDER — SODIUM CHLORIDE 0.9 % WEIGHT BASED INFUSION: 1.0000 mL/kg/h | INTRAVENOUS | Status: DC

## 2021-07-09 MED ORDER — ONDANSETRON HCL 4 MG/2ML IJ SOLN: 4.0000 mg | Freq: Four times a day (QID) | INTRAMUSCULAR | Status: DC | PRN

## 2021-07-09 MED ORDER — MIDAZOLAM HCL 2 MG/2ML IJ SOLN
INTRAMUSCULAR | Status: DC | PRN
  Administered 2021-07-09: .5 mg via INTRAVENOUS

## 2021-07-09 MED ORDER — FENTANYL CITRATE (PF) 100 MCG/2ML IJ SOLN
INTRAMUSCULAR | Status: AC
  Filled 2021-07-09: qty 2

## 2021-07-09 MED ORDER — LABETALOL HCL 5 MG/ML IV SOLN: 10.0000 mg | INTRAVENOUS | Status: AC | PRN

## 2021-07-09 MED ORDER — OXYCODONE HCL ER 10 MG PO T12A
10.0000 mg | EXTENDED_RELEASE_TABLET | Freq: Every day | ORAL | Status: DC
  Administered 2021-07-09 – 2021-07-10 (×2): 10 mg via ORAL
  Filled 2021-07-09 (×3): qty 1

## 2021-07-09 MED ORDER — NAPROXEN 250 MG PO TABS
500.0000 mg | ORAL_TABLET | Freq: Every day | ORAL | Status: DC | PRN
  Filled 2021-07-09: qty 2

## 2021-07-09 MED ORDER — HYDRALAZINE HCL 20 MG/ML IJ SOLN: 10.0000 mg | INTRAMUSCULAR | Status: AC | PRN

## 2021-07-09 MED ORDER — FAMOTIDINE 20 MG PO TABS: 20.0000 mg | ORAL_TABLET | Freq: Once | ORAL | Status: DC

## 2021-07-09 MED ORDER — DIAZEPAM 5 MG PO TABS: 5.0000 mg | ORAL_TABLET | Freq: Two times a day (BID) | ORAL | Status: DC | PRN

## 2021-07-09 MED ORDER — SODIUM CHLORIDE 0.9 % WEIGHT BASED INFUSION
1.0000 mL/kg/h | INTRAVENOUS | Status: AC
  Administered 2021-07-09: 1 mL/kg/h via INTRAVENOUS

## 2021-07-09 MED ORDER — DIPHENHYDRAMINE HCL 50 MG/ML IJ SOLN: 50.0000 mg | Freq: Once | INTRAMUSCULAR | Status: DC

## 2021-07-09 MED ORDER — OXYCODONE HCL ER 10 MG PO T12A: 10.0000 mg | EXTENDED_RELEASE_TABLET | ORAL | Status: DC

## 2021-07-09 MED ORDER — OXYCODONE HCL ER 10 MG PO T12A
10.0000 mg | EXTENDED_RELEASE_TABLET | Freq: Every day | ORAL | Status: DC
  Administered 2021-07-10: 10 mg via ORAL
  Filled 2021-07-09: qty 1

## 2021-07-09 MED ORDER — CIPROFLOXACIN HCL 0.3 % OP SOLN
1.0000 [drp] | Freq: Every day | OPHTHALMIC | Status: DC | PRN
  Filled 2021-07-09: qty 2.5

## 2021-07-09 MED ORDER — ASPIRIN 81 MG PO CHEW: 81.0000 mg | CHEWABLE_TABLET | ORAL | Status: AC

## 2021-07-09 SURGICAL SUPPLY — 13 items
CATH INFINITI 5FR MULTPACK ANG (CATHETERS) ×1 IMPLANT
CATH SWAN GANZ 7F STRAIGHT (CATHETERS) ×1 IMPLANT
DEVICE CLOSURE MYNXGRIP 5F (Vascular Products) ×1 IMPLANT
DRAPE BRACHIAL (DRAPES) IMPLANT
NDL PERC 18GX7CM (NEEDLE) IMPLANT
NEEDLE PERC 18GX7CM (NEEDLE) ×2 IMPLANT
PACK CARDIAC CATH (CUSTOM PROCEDURE TRAY) ×2 IMPLANT
PROTECTION STATION PRESSURIZED (MISCELLANEOUS) ×2
SET ATX SIMPLICITY (MISCELLANEOUS) ×1 IMPLANT
SHEATH AVANTI 5FR X 11CM (SHEATH) ×1 IMPLANT
SHEATH AVANTI 7FRX11 (SHEATH) ×1 IMPLANT
STATION PROTECTION PRESSURIZED (MISCELLANEOUS) IMPLANT
WIRE GUIDERIGHT .035X150 (WIRE) ×1 IMPLANT

## 2021-07-10 ENCOUNTER — Encounter: Payer: Self-pay | Admitting: Internal Medicine

## 2021-07-10 DIAGNOSIS — N1831 Chronic kidney disease, stage 3a: Secondary | ICD-10-CM | POA: Diagnosis not present

## 2021-07-10 DIAGNOSIS — I129 Hypertensive chronic kidney disease with stage 1 through stage 4 chronic kidney disease, or unspecified chronic kidney disease: Secondary | ICD-10-CM | POA: Diagnosis not present

## 2021-07-10 DIAGNOSIS — Z7982 Long term (current) use of aspirin: Secondary | ICD-10-CM | POA: Diagnosis not present

## 2021-07-10 DIAGNOSIS — Z853 Personal history of malignant neoplasm of breast: Secondary | ICD-10-CM | POA: Diagnosis not present

## 2021-07-10 DIAGNOSIS — E1122 Type 2 diabetes mellitus with diabetic chronic kidney disease: Secondary | ICD-10-CM | POA: Diagnosis not present

## 2021-07-10 DIAGNOSIS — R079 Chest pain, unspecified: Secondary | ICD-10-CM | POA: Diagnosis not present

## 2021-07-10 DIAGNOSIS — Z79899 Other long term (current) drug therapy: Secondary | ICD-10-CM | POA: Diagnosis not present

## 2021-07-10 DIAGNOSIS — Z7984 Long term (current) use of oral hypoglycemic drugs: Secondary | ICD-10-CM | POA: Diagnosis not present

## 2021-07-10 DIAGNOSIS — I2510 Atherosclerotic heart disease of native coronary artery without angina pectoris: Secondary | ICD-10-CM | POA: Diagnosis not present

## 2021-07-10 LAB — CBC
HCT: 34.8 % — ABNORMAL LOW (ref 36.0–46.0)
Hemoglobin: 11.4 g/dL — ABNORMAL LOW (ref 12.0–15.0)
MCH: 30.2 pg (ref 26.0–34.0)
MCHC: 32.8 g/dL (ref 30.0–36.0)
MCV: 92.3 fL (ref 80.0–100.0)
Platelets: 289 10*3/uL (ref 150–400)
RBC: 3.77 MIL/uL — ABNORMAL LOW (ref 3.87–5.11)
RDW: 13.6 % (ref 11.5–15.5)
WBC: 12.2 10*3/uL — ABNORMAL HIGH (ref 4.0–10.5)
nRBC: 0 % (ref 0.0–0.2)

## 2021-07-10 LAB — TROPONIN I (HIGH SENSITIVITY)
Troponin I (High Sensitivity): 16 ng/L (ref <18)
Troponin I (High Sensitivity): 15 ng/L (ref <18)

## 2021-07-10 LAB — HEPARIN LEVEL (UNFRACTIONATED): Heparin Unfractionated: <0.1 IU/mL — ABNORMAL LOW (ref 0.30–0.70)

## 2021-07-10 MED ORDER — HEPARIN (PORCINE) 25000 UT/250ML-% IV SOLN
800.0000 [IU]/h | INTRAVENOUS | Status: DC
  Administered 2021-07-10: 800 [IU]/h via INTRAVENOUS
  Filled 2021-07-10: qty 250

## 2021-07-10 MED ORDER — CHLORHEXIDINE GLUCONATE CLOTH 2 % EX PADS
6.0000 | MEDICATED_PAD | Freq: Every day | CUTANEOUS | Status: DC
  Administered 2021-07-10: 6 via TOPICAL

## 2021-07-10 MED ORDER — FAMOTIDINE 20 MG PO TABS: 20.0000 mg | ORAL_TABLET | Freq: Every day | ORAL | Status: DC

## 2021-07-10 MED ORDER — HEPARIN BOLUS VIA INFUSION
4000.0000 [IU] | Freq: Once | INTRAVENOUS | Status: AC
  Administered 2021-07-10: 4000 [IU] via INTRAVENOUS
  Filled 2021-07-10: qty 4000

## 2021-07-10 MED ORDER — SODIUM CHLORIDE 0.9 % IV SOLN: INTRAVENOUS | Status: DC

## 2021-07-10 MED ORDER — HEPARIN (PORCINE) 25000 UT/250ML-% IV SOLN
1050.0000 [IU]/h | INTRAVENOUS | Status: DC
  Filled 2021-07-10: qty 250

## 2021-07-10 MED ORDER — FAMOTIDINE 20 MG PO TABS
20.0000 mg | ORAL_TABLET | Freq: Two times a day (BID) | ORAL | Status: DC
  Administered 2021-07-10: 20 mg via ORAL
  Filled 2021-07-10: qty 1

## 2021-07-10 MED ORDER — HEPARIN BOLUS VIA INFUSION
3900.0000 [IU] | Freq: Once | INTRAVENOUS | Status: AC
  Administered 2021-07-10: 3900 [IU] via INTRAVENOUS
  Filled 2021-07-10: qty 3900

## 2021-07-10 MED ORDER — NITROGLYCERIN 2 % TD OINT
0.5000 [in_us] | TOPICAL_OINTMENT | Freq: Four times a day (QID) | TRANSDERMAL | Status: DC
  Administered 2021-07-10 (×2): 0.5 [in_us] via TOPICAL
  Filled 2021-07-10 (×2): qty 1

## 2021-07-10 MED ORDER — NITROGLYCERIN 0.4 MG SL SUBL
0.4000 mg | SUBLINGUAL_TABLET | Freq: Once | SUBLINGUAL | Status: AC
  Administered 2021-07-10: 0.4 mg via SUBLINGUAL

## 2021-07-10 MED ORDER — NITROGLYCERIN 0.4 MG SL SUBL
0.4000 mg | SUBLINGUAL_TABLET | SUBLINGUAL | Status: AC | PRN
  Administered 2021-07-10 (×3): 0.4 mg via SUBLINGUAL
  Filled 2021-07-10 (×2): qty 1

## 2021-07-10 MED ORDER — MORPHINE SULFATE (PF) 2 MG/ML IV SOLN
2.0000 mg | Freq: Once | INTRAVENOUS | Status: AC
  Administered 2021-07-10: 2 mg via INTRAVENOUS
  Filled 2021-07-10: qty 1

## 2021-07-10 NOTE — Progress Notes (Addendum)
ANTICOAGULATION CONSULT NOTE ? ?Pharmacy Consult for Heparin  ?Indication: chest pain/ACS ? ?Allergies  ?Allergen Reactions  ? Ivp Dye [Iodinated Contrast Media] Swelling  ? Sulfa Antibiotics Swelling  ? ? ?Patient Measurements: ?Height: '4\' 11"'$  (149.9 cm) ?Weight: 90.3 kg (199 lb 1.2 oz) ?IBW/kg (Calculated) : 43.2 ?Heparin Dosing Weight: 64.9 kg  ? ?Vital Signs: ?Temp: 98.2 ?F (36.8 ?C) (03/16 1504) ?BP: 113/51 (03/16 1504) ?Pulse Rate: 62 (03/16 1504) ? ?Labs: ?Recent Labs  ?  07/10/21 ?0912 07/10/21 ?1222  ?HGB 11.4*  --   ?HCT 34.8*  --   ?PLT 289  --   ?HEPARINUNFRC  --  <0.10*  ? ? ?CrCl cannot be calculated (Patient's most recent lab result is older than the maximum 21 days allowed.). ? ? ?Medical History: ?Past Medical History:  ?Diagnosis Date  ? Arthritis   ? Diabetes mellitus without complication ([place])   ? Hypertension   ? ? ?Medications:  ?Medications Prior to Admission  ?Medication Sig Dispense Refill Last Dose  ? amLODipine (NORVASC) 2.5 MG tablet Take 2.5 mg by mouth daily.   07/09/2021  ? aspirin 81 MG EC tablet Take 81 mg by mouth at bedtime.   07/08/2021  ? ciprofloxacin (CILOXAN) 0.3 % ophthalmic solution Place 1 drop into both eyes daily as needed (eye redness).   07/08/2021  ? dexlansoprazole (DEXILANT) 60 MG capsule Take 60 mg by mouth daily.   07/08/2021  ? diazepam (VALIUM) 5 MG tablet Take 5 mg by mouth every 12 (twelve) hours as needed for anxiety.   07/08/2021  ? diphenhydrAMINE (BENADRYL) 25 MG tablet Take 50 mg by mouth every 6 (six) hours as needed for allergies.   07/09/2021  ? escitalopram (LEXAPRO) 20 MG tablet Take 20 mg by mouth daily.   07/09/2021  ? famotidine (PEPCID) 20 MG tablet Take 20 mg by mouth 2 (two) times daily.   07/09/2021  ? fenofibrate (TRICOR) 145 MG tablet Take 145 mg by mouth at bedtime.   07/08/2021  ? fluticasone (CUTIVATE) 0.05 % cream Apply 1 application. topically daily as needed for rash.     ? furosemide (LASIX) 40 MG tablet Take 40 mg by mouth daily.   07/08/2021   ? gabapentin (NEURONTIN) 300 MG capsule Take 300 mg by mouth at bedtime.   07/08/2021  ? losartan (COZAAR) 100 MG tablet Take 100 mg by mouth daily.   07/09/2021  ? Multiple Vitamin (MULTIVITAMIN) capsule Take 2 capsules by mouth daily.   07/08/2021  ? Multiple Vitamins-Minerals (ONE A DAY IMMUNITY DEFENSE PO) Take 2 tablets by mouth daily.   07/08/2021  ? naproxen sodium (ALEVE) 220 MG tablet Take 440 mg by mouth daily as needed (pain).     ? omega-3 acid ethyl esters (LOVAZA) 1 G capsule Take 2 g by mouth 2 (two) times daily.   07/09/2021  ? oxyCODONE (OXYCONTIN) 10 mg 12 hr tablet Take 10-20 mg by mouth See admin instructions. Take 20 mg in the morning, 10 mg in the afternoon, and 10 mg at bedtime   07/09/2021  ? potassium chloride (K-DUR) 10 MEQ tablet Take 10 mEq by mouth daily.  2 07/08/2021  ? predniSONE (DELTASONE) 10 MG tablet Take 10 mg by mouth every 6 (six) hours.   07/09/2021 at 0900AM  ? predniSONE (DELTASONE) 5 MG tablet Take 5 mg by mouth in the morning and at bedtime.     ? predniSONE (DELTASONE) 50 MG tablet Take 50 mg by mouth See admin instructions. Take 50 mg  13 hours, 7 hours, and 1 hour prior to procedure   07/09/2021 at 0900am  ? sitaGLIPtin (JANUVIA) 100 MG tablet Take 100 mg by mouth daily.   07/08/2021  ? ? ?Assessment: ?Pharmacy consulted to dose heparin in this 86 year old female admitted with NSTEMI/ACS.  No prior anticoag noted.  ? ?Goal of Therapy:  ?Heparin level 0.3-0.7 units/ml ?Monitor platelets by anticoagulation protocol: Yes ?  ?Plan:  ?Give 4000 units bolus x 1 ?Start heparin infusion at 800 units/hr ?Check anti-Xa level in 8 hours and daily while on heparin ?Continue to monitor H&H and platelets ? ?Oswald Hillock, PharmD, BCPS  ?07/10/2021,4:13 PM ? ? ?

## 2021-07-10 NOTE — Progress Notes (Signed)
Vital signs taken at 4 am are stable. Patient rates pain 0/10. Patient is now sound asleep. ? ?IV Heparin going, and patient is still on 2L of O2 ? ?Dr. Nehemiah Massed informed ?

## 2021-07-10 NOTE — Progress Notes (Signed)
86 year old female admitted for an elective Right and Left Cath.  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  Accessed through Right femoral groin.  ? ?Mynx dressing present. Dressing clean, dry, and intact  ? ?Patient is currently on Normal Saline going at 90.3 ml/hr. ? ?Skin assessment shows bilateral scars present on knees from previous knee replacement. ?

## 2021-07-10 NOTE — Care Management Obs Status (Signed)
MEDICARE OBSERVATION STATUS NOTIFICATION ? ? ?Patient Details  ?Name: Hayley Knight ?MRN: [id number] ?Date of Birth: [birth year] ? ? ?Medicare Observation Status Notification Given:  Yes ? ? ? ?Candie Chroman, LCSW ?07/10/2021, 3:08 PM ?

## 2021-07-10 NOTE — Progress Notes (Signed)
Patient's vitals taken. First dose of Nitro administered. Pain is persistent. 6/10 ?

## 2021-07-10 NOTE — Progress Notes (Signed)
Patient administered '2mg'$  IV morphine. ? ?Bolus of Heparin administered ? ?IV Heparin continuous currently going at 46m/ hr ?Dual sign off conmplete ? ?IV Normal Saline restarted at 130mhr ?

## 2021-07-10 NOTE — Progress Notes (Signed)
Dr. Nehemiah Massed orders a 4th Dose of Nitro. Administered STAT.  ? ?IV Heparin ordered and started. See eMAR. ? ?Nitro paste applied to patient's right chest. ? ?Patient rates pain as 3/10. EKG performed shows NSR. EKG placed in patient's chart ? ?First EKG performed showed NSTEMI but EKG was repeated a second and third time and showed NSR ?

## 2021-07-10 NOTE — Progress Notes (Signed)
ANTICOAGULATION CONSULT NOTE - Initial Consult ? ?Pharmacy Consult for Heparin  ?Indication: chest pain/ACS ? ?Allergies  ?Allergen Reactions  ? Ivp Dye [Iodinated Contrast Media] Swelling  ? Sulfa Antibiotics Swelling  ? ? ?Patient Measurements: ?Height: '4\' 11"'$  (149.9 cm) ?Weight: 90.3 kg (199 lb 1.2 oz) ?IBW/kg (Calculated) : 43.2 ?Heparin Dosing Weight: 64.9 kg  ? ?Vital Signs: ?Temp: 97 ?F (36.1 ?C) (03/16 0300) ?Temp Source: Oral (03/16 0300) ?BP: 126/68 (03/16 0307) ?Pulse Rate: 64 (03/16 0307) ? ?Labs: ?No results for input(s): HGB, HCT, PLT, APTT, LABPROT, INR, HEPARINUNFRC, HEPRLOWMOCWT, CREATININE, CKTOTAL, CKMB, TROPONINIHS in the last 72 hours. ? ?CrCl cannot be calculated (Patient's most recent lab result is older than the maximum 21 days allowed.). ? ? ?Medical History: ?Past Medical History:  ?Diagnosis Date  ? Arthritis   ? Diabetes mellitus without complication ([place])   ? Hypertension   ? ? ?Medications:  ?Medications Prior to Admission  ?Medication Sig Dispense Refill Last Dose  ? amLODipine (NORVASC) 2.5 MG tablet Take 2.5 mg by mouth daily.   07/09/2021  ? aspirin 81 MG EC tablet Take 81 mg by mouth at bedtime.   07/08/2021  ? ciprofloxacin (CILOXAN) 0.3 % ophthalmic solution Place 1 drop into both eyes daily as needed (eye redness).   07/08/2021  ? dexlansoprazole (DEXILANT) 60 MG capsule Take 60 mg by mouth daily.   07/08/2021  ? diazepam (VALIUM) 5 MG tablet Take 5 mg by mouth every 12 (twelve) hours as needed for anxiety.   07/08/2021  ? diphenhydrAMINE (BENADRYL) 25 MG tablet Take 50 mg by mouth every 6 (six) hours as needed for allergies.   07/09/2021  ? escitalopram (LEXAPRO) 20 MG tablet Take 20 mg by mouth daily.   07/09/2021  ? famotidine (PEPCID) 20 MG tablet Take 20 mg by mouth 2 (two) times daily.   07/09/2021  ? fenofibrate (TRICOR) 145 MG tablet Take 145 mg by mouth at bedtime.   07/08/2021  ? fluticasone (CUTIVATE) 0.05 % cream Apply 1 application. topically daily as needed for rash.     ?  furosemide (LASIX) 40 MG tablet Take 40 mg by mouth daily.   07/08/2021  ? gabapentin (NEURONTIN) 300 MG capsule Take 300 mg by mouth at bedtime.   07/08/2021  ? losartan (COZAAR) 100 MG tablet Take 100 mg by mouth daily.   07/09/2021  ? Multiple Vitamin (MULTIVITAMIN) capsule Take 2 capsules by mouth daily.   07/08/2021  ? Multiple Vitamins-Minerals (ONE A DAY IMMUNITY DEFENSE PO) Take 2 tablets by mouth daily.   07/08/2021  ? naproxen sodium (ALEVE) 220 MG tablet Take 440 mg by mouth daily as needed (pain).     ? omega-3 acid ethyl esters (LOVAZA) 1 G capsule Take 2 g by mouth 2 (two) times daily.   07/09/2021  ? oxyCODONE (OXYCONTIN) 10 mg 12 hr tablet Take 10-20 mg by mouth See admin instructions. Take 20 mg in the morning, 10 mg in the afternoon, and 10 mg at bedtime   07/09/2021  ? potassium chloride (K-DUR) 10 MEQ tablet Take 10 mEq by mouth daily.  2 07/08/2021  ? predniSONE (DELTASONE) 10 MG tablet Take 10 mg by mouth every 6 (six) hours.   07/09/2021 at 0900AM  ? predniSONE (DELTASONE) 5 MG tablet Take 5 mg by mouth in the morning and at bedtime.     ? predniSONE (DELTASONE) 50 MG tablet Take 50 mg by mouth See admin instructions. Take 50 mg 13 hours, 7 hours, and 1  hour prior to procedure   07/09/2021 at 0900am  ? sitaGLIPtin (JANUVIA) 100 MG tablet Take 100 mg by mouth daily.   07/08/2021  ? ? ?Assessment: ?Pharmacy consulted to dose heparin in this 86 year old female admitted with NSTEMI/ACS.  No prior anticoag noted.  ?CrCl = ?  ? ?Goal of Therapy:  ?Heparin level 0.3-0.7 units/ml ?Monitor platelets by anticoagulation protocol: Yes ?  ?Plan:  ?Give 3900 units bolus x 1 ?Start heparin infusion at 800 units/hr ?Check anti-Xa level in 8 hours and daily while on heparin ?Continue to monitor H&H and platelets ? ?Alejandrina Raimer D ?07/10/2021,3:37 AM ? ? ?

## 2021-07-10 NOTE — Progress Notes (Signed)
Normal Saline discontinued ? ?Night medications administered as prescribed.  ?

## 2021-07-10 NOTE — Progress Notes (Signed)
2nd dose of Nitro administered. ?Pain rated 5/10 ?

## 2021-07-10 NOTE — Progress Notes (Signed)
3rd dose of Nitro administered. Pain rated 4/10. ? ?Patient is also currently on 2L of Oxygen ?

## 2021-07-10 NOTE — Progress Notes (Signed)
[facility] Cardiology ? ? ? ?SUBJECTIVE: Patient complained of chest pain last evening requiring nitroglycerin and heparin patient is already on aspirin she is not sure what this is reflux or not but she started having chest discomfort which prompted aggressive medical therapy ? ? ?Vitals:  ? 07/09/21 2309 07/10/21 0300 07/10/21 0307 07/10/21 0400  ?BP: 134/61 127/73 126/68 127/62  ?Pulse: 67 66 64 67  ?Resp: '18 17 18 18  '$ ?Temp: (!) 97.3 ?F (36.3 ?C) (!) 97 ?F (36.1 ?C) (!) 97.4 ?F (36.3 ?C) 98.3 ?F (36.8 ?C)  ?TempSrc:  Oral Oral Oral  ?SpO2: 96% 97% 97% 98%  ?Weight:      ?Height:      ? ? ? ?Intake/Output Summary (Last 24 hours) at 07/10/2021 0707 ?Last data filed at 07/10/2021 0645 ?Gross per 24 hour  ?Intake 608.9 ml  ?Output --  ?Net 608.9 ml  ? ? ? ? ?PHYSICAL EXAM ? ?General: Well developed, well nourished, in no acute distress ?HEENT:  Normocephalic and atramatic ?Neck:  No JVD.  ?Lungs: Clear bilaterally to auscultation and percussion. ?Heart: HRRR . Normal S1 and S2 without gallops or murmurs.  ?Abdomen: Bowel sounds are positive, abdomen soft and non-tender  ?Msk:  Back normal, normal gait. Normal strength and tone for age. ?Extremities: No clubbing, cyanosis or edema.   ?Neuro: Alert and oriented X 3. ?Psych:  Good affect, responds appropriately ? ? ?LABS: ?Basic Metabolic Panel: ?No results for input(s): NA, K, CL, CO2, GLUCOSE, BUN, CREATININE, CALCIUM, MG, PHOS in the last 72 hours. ?Liver Function Tests: ?No results for input(s): AST, ALT, ALKPHOS, BILITOT, PROT, ALBUMIN in the last 72 hours. ?No results for input(s): LIPASE, AMYLASE in the last 72 hours. ?CBC: ?No results for input(s): WBC, NEUTROABS, HGB, HCT, MCV, PLT in the last 72 hours. ?Cardiac Enzymes: ?No results for input(s): CKTOTAL, CKMB, CKMBINDEX, TROPONINI in the last 72 hours. ?BNP: ?Invalid input(s): POCBNP ?D-Dimer: ?No results for input(s): DDIMER in the last 72 hours. ?Hemoglobin A1C: ?No results for input(s): HGBA1C in the last 72  hours. ?Fasting Lipid Panel: ?No results for input(s): CHOL, HDL, LDLCALC, TRIG, CHOLHDL, LDLDIRECT in the last 72 hours. ?Thyroid Function Tests: ?No results for input(s): TSH, T4TOTAL, T3FREE, THYROIDAB in the last 72 hours. ? ?Invalid input(s): FREET3 ?Anemia Panel: ?No results for input(s): VITAMINB12, FOLATE, FERRITIN, TIBC, IRON, RETICCTPCT in the last 72 hours. ? ?CARDIAC CATHETERIZATION ? ?Result Date: 07/09/2021 ?  Prox RCA-2 lesion is 100% stenosed.   Prox RCA-1 lesion is 100% stenosed.   The left ventricular systolic function is normal.   LV end diastolic pressure is normal.   The left ventricular ejection fraction is 55-65% by visual estimate.   Hemodynamic findings consistent with mild pulmonary hypertension.   Left main free of disease   LAD large relatively free of disease   Circumflex was large relatively free of disease   Intervention was deferred   Patient to be referred to tertiary care center for CTO intervention Conclusion Right heart cath right femoral vein Mean wedge of 11 mean PA 28 Left heart cath Normal left ventricular function of 55% Left main normal minor irregularities LAD normal large Circumflex normal large RCA 100% occluded proximally CTO Extensive collaterals left to right Minx was deployed to the femoral artery Patient tolerated procedure well No complications Intervention was deferred patient to be referred to a tertiary care center for CTO intervention as an outpatient   ? ? ?Echo normal left ventricular function EF of at least 55%  evidence of mild to moderate pulmonary hypertension ? ?TELEMETRY: Normal sinus rhythm rate of 75 nonspecific ST-T wave changes: ? ?ASSESSMENT AND PLAN: ? ?Principal Problem: ?  Dyspnea ?Unstable angina ?Coronary artery disease ?GERD ?Obesity ?Pulmonary hypertension ?Status post right and left heart cath ?Marland Kitchen ?Plan ?Continue telemetry ?Patient currently on heparin aspirin for unstable angina ?Agree with nitrates for possible anginal symptoms ?Supplemental  oxygen as necessary ?Aggressive therapy for reflux type symptoms ?Negative troponins post chest pain no EKG changes ?Transfer patient to [facility] for complex intervention of CTO ?Case discussed with Dr. Koleen Distance and Dr. Baron Hamper as well as Dr. Chana Bode ? ? ? ? ? ?Yolonda Kida, MD ?07/10/2021 ?7:07 AM ? ? ? ?  ?

## 2021-07-10 NOTE — Progress Notes (Signed)
Patient has a room available at [facility]: [phone] ? ?[facility] at [phone] to give report ? ?Report given to [facility] Nurse: Talmadge Chad, RN ? ?Confirmed report with [facility] Charge Nurse: Hoyle Sauer ? ?Called [facility]  [phone] to schedule transport for this patient ? ?Transport scheduled with [employer]. ? ?Referring physician: Dr. Lujean Amel ?Receiving Physician: Dr. Laddie Aquas ?

## 2021-07-10 NOTE — Progress Notes (Signed)
Patient complains of chest pain in right side of chest. Rates pain as 6/10. Describes pain as acute and sharp. Patient feels more pain on exertion. ? ?Orders put in and Chest pain protocol started. Dr. Maurie Boettcher. On call informed.  ? ?ECG performed. See patient chart ?

## 2021-07-11 DIAGNOSIS — I129 Hypertensive chronic kidney disease with stage 1 through stage 4 chronic kidney disease, or unspecified chronic kidney disease: Secondary | ICD-10-CM | POA: Diagnosis not present

## 2021-07-11 DIAGNOSIS — K219 Gastro-esophageal reflux disease without esophagitis: Secondary | ICD-10-CM | POA: Diagnosis not present

## 2021-07-11 DIAGNOSIS — N1831 Chronic kidney disease, stage 3a: Secondary | ICD-10-CM | POA: Diagnosis not present

## 2021-07-11 DIAGNOSIS — I2510 Atherosclerotic heart disease of native coronary artery without angina pectoris: Secondary | ICD-10-CM | POA: Diagnosis not present

## 2021-07-11 DIAGNOSIS — Z794 Long term (current) use of insulin: Secondary | ICD-10-CM | POA: Diagnosis not present

## 2021-07-11 DIAGNOSIS — Z7982 Long term (current) use of aspirin: Secondary | ICD-10-CM | POA: Diagnosis not present

## 2021-07-11 DIAGNOSIS — Z9011 Acquired absence of right breast and nipple: Secondary | ICD-10-CM | POA: Diagnosis not present

## 2021-07-11 DIAGNOSIS — I214 Non-ST elevation (NSTEMI) myocardial infarction: Secondary | ICD-10-CM | POA: Diagnosis not present

## 2021-07-11 DIAGNOSIS — I2582 Chronic total occlusion of coronary artery: Secondary | ICD-10-CM | POA: Diagnosis not present

## 2021-07-11 DIAGNOSIS — Z79899 Other long term (current) drug therapy: Secondary | ICD-10-CM | POA: Diagnosis not present

## 2021-07-11 DIAGNOSIS — I10 Essential (primary) hypertension: Secondary | ICD-10-CM | POA: Diagnosis not present

## 2021-07-11 DIAGNOSIS — Z96653 Presence of artificial knee joint, bilateral: Secondary | ICD-10-CM | POA: Diagnosis not present

## 2021-07-11 DIAGNOSIS — Z853 Personal history of malignant neoplasm of breast: Secondary | ICD-10-CM | POA: Diagnosis not present

## 2021-07-11 DIAGNOSIS — Z888 Allergy status to other drugs, medicaments and biological substances status: Secondary | ICD-10-CM | POA: Diagnosis not present

## 2021-07-11 DIAGNOSIS — E785 Hyperlipidemia, unspecified: Secondary | ICD-10-CM | POA: Diagnosis not present

## 2021-07-11 DIAGNOSIS — I272 Pulmonary hypertension, unspecified: Secondary | ICD-10-CM | POA: Diagnosis not present

## 2021-07-11 DIAGNOSIS — E1169 Type 2 diabetes mellitus with other specified complication: Secondary | ICD-10-CM | POA: Diagnosis not present

## 2021-07-11 DIAGNOSIS — I240 Acute coronary thrombosis not resulting in myocardial infarction: Secondary | ICD-10-CM | POA: Diagnosis not present

## 2021-07-11 DIAGNOSIS — I25110 Atherosclerotic heart disease of native coronary artery with unstable angina pectoris: Secondary | ICD-10-CM | POA: Diagnosis not present

## 2021-07-11 DIAGNOSIS — E1122 Type 2 diabetes mellitus with diabetic chronic kidney disease: Secondary | ICD-10-CM | POA: Diagnosis not present

## 2021-07-11 DIAGNOSIS — Z882 Allergy status to sulfonamides status: Secondary | ICD-10-CM | POA: Diagnosis not present

## 2021-07-11 DIAGNOSIS — G8929 Other chronic pain: Secondary | ICD-10-CM | POA: Diagnosis not present

## 2021-07-11 DIAGNOSIS — M545 Low back pain, unspecified: Secondary | ICD-10-CM | POA: Diagnosis not present

## 2021-07-11 DIAGNOSIS — Z7984 Long term (current) use of oral hypoglycemic drugs: Secondary | ICD-10-CM | POA: Diagnosis not present

## 2021-07-11 LAB — HEPARIN LEVEL (UNFRACTIONATED): Heparin Unfractionated: <0.1 IU/mL — ABNORMAL LOW (ref 0.30–0.70)

## 2021-07-11 MED ORDER — HEPARIN (PORCINE) 25000 UT/250ML-% IV SOLN: 800.0000 [IU]/h | INTRAVENOUS | 0 refills | Status: DC

## 2021-07-11 MED ORDER — NITROGLYCERIN 2 % TD OINT: 0.5000 [in_us] | TOPICAL_OINTMENT | Freq: Four times a day (QID) | TRANSDERMAL | 0 refills | Status: DC

## 2021-07-11 MED ORDER — ONDANSETRON HCL 4 MG/2ML IJ SOLN: 4.0000 mg | Freq: Four times a day (QID) | INTRAMUSCULAR | 0 refills | Status: DC | PRN

## 2021-07-11 MED ORDER — HEPARIN BOLUS VIA INFUSION
1900.0000 [IU] | Freq: Once | INTRAVENOUS | Status: DC
  Filled 2021-07-11: qty 1900

## 2021-07-11 MED ORDER — HEPARIN BOLUS VIA INFUSION: 1900.0000 [IU] | Freq: Once | INTRAVENOUS | 0 refills | Status: DC

## 2021-07-11 NOTE — Progress Notes (Signed)
ANTICOAGULATION CONSULT NOTE ? ?Pharmacy Consult for Heparin  ?Indication: chest pain/ACS ? ?Allergies  ?Allergen Reactions  ? Ivp Dye [Iodinated Contrast Media] Swelling  ? Sulfa Antibiotics Swelling  ? ? ?Patient Measurements: ?Height: '4\' 11"'$  (149.9 cm) ?Weight: 90.3 kg (199 lb 1.2 oz) ?IBW/kg (Calculated) : 43.2 ?Heparin Dosing Weight: 64.9 kg  ? ?Vital Signs: ?Temp: 98.5 ?F (36.9 ?C) (03/17 0038) ?Temp Source: Oral (03/16 2000) ?BP: 118/59 (03/17 0038) ?Pulse Rate: 67 (03/17 0038) ? ?Labs: ?Recent Labs  ?  07/10/21 ?0912 07/10/21 ?1222 07/10/21 ?1711 07/10/21 ?1908 07/11/21 ?9563  ?HGB 11.4*  --   --   --   --   ?HCT 34.8*  --   --   --   --   ?PLT 289  --   --   --   --   ?HEPARINUNFRC  --  <0.10*  --   --  <0.10*  ?TROPONINIHS  --   --  16 15  --   ? ? ? ?CrCl cannot be calculated (Patient's most recent lab result is older than the maximum 21 days allowed.). ? ? ?Medical History: ?Past Medical History:  ?Diagnosis Date  ? Arthritis   ? Diabetes mellitus without complication ([place])   ? Hypertension   ? ? ?Medications:  ?Medications Prior to Admission  ?Medication Sig Dispense Refill Last Dose  ? amLODipine (NORVASC) 2.5 MG tablet Take 2.5 mg by mouth daily.   07/09/2021  ? aspirin 81 MG EC tablet Take 81 mg by mouth at bedtime.   07/08/2021  ? ciprofloxacin (CILOXAN) 0.3 % ophthalmic solution Place 1 drop into both eyes daily as needed (eye redness).   07/08/2021  ? dexlansoprazole (DEXILANT) 60 MG capsule Take 60 mg by mouth daily.   07/08/2021  ? diazepam (VALIUM) 5 MG tablet Take 5 mg by mouth every 12 (twelve) hours as needed for anxiety.   07/08/2021  ? diphenhydrAMINE (BENADRYL) 25 MG tablet Take 50 mg by mouth every 6 (six) hours as needed for allergies.   07/09/2021  ? escitalopram (LEXAPRO) 20 MG tablet Take 20 mg by mouth daily.   07/09/2021  ? famotidine (PEPCID) 20 MG tablet Take 20 mg by mouth 2 (two) times daily.   07/09/2021  ? fenofibrate (TRICOR) 145 MG tablet Take 145 mg by mouth at bedtime.    07/08/2021  ? fluticasone (CUTIVATE) 0.05 % cream Apply 1 application. topically daily as needed for rash.     ? furosemide (LASIX) 40 MG tablet Take 40 mg by mouth daily.   07/08/2021  ? gabapentin (NEURONTIN) 300 MG capsule Take 300 mg by mouth at bedtime.   07/08/2021  ? losartan (COZAAR) 100 MG tablet Take 100 mg by mouth daily.   07/09/2021  ? Multiple Vitamin (MULTIVITAMIN) capsule Take 2 capsules by mouth daily.   07/08/2021  ? Multiple Vitamins-Minerals (ONE A DAY IMMUNITY DEFENSE PO) Take 2 tablets by mouth daily.   07/08/2021  ? naproxen sodium (ALEVE) 220 MG tablet Take 440 mg by mouth daily as needed (pain).     ? omega-3 acid ethyl esters (LOVAZA) 1 G capsule Take 2 g by mouth 2 (two) times daily.   07/09/2021  ? oxyCODONE (OXYCONTIN) 10 mg 12 hr tablet Take 10-20 mg by mouth See admin instructions. Take 20 mg in the morning, 10 mg in the afternoon, and 10 mg at bedtime   07/09/2021  ? potassium chloride (K-DUR) 10 MEQ tablet Take 10 mEq by mouth daily.  2 07/08/2021  ?  predniSONE (DELTASONE) 10 MG tablet Take 10 mg by mouth every 6 (six) hours.   07/09/2021 at 0900AM  ? predniSONE (DELTASONE) 5 MG tablet Take 5 mg by mouth in the morning and at bedtime.     ? predniSONE (DELTASONE) 50 MG tablet Take 50 mg by mouth See admin instructions. Take 50 mg 13 hours, 7 hours, and 1 hour prior to procedure   07/09/2021 at 0900am  ? sitaGLIPtin (JANUVIA) 100 MG tablet Take 100 mg by mouth daily.   07/08/2021  ? ? ?Assessment: ?Pharmacy consulted to dose heparin in this 86 year old female admitted with NSTEMI/ACS.  No prior anticoag noted.  ? ?Goal of Therapy:  ?Heparin level 0.3-0.7 units/ml ?Monitor platelets by anticoagulation protocol: Yes ?  ?Plan:  ?3/17:  HL @ 0046 = < 0.1 ?Will order heparin 1900 units IV X 1 bolus and increase drip rate to 1050 units/hr.  ? ?-  Pt transferred to [facility] before changes in drip could be made.  ? ?Dinh Ayotte D, PharmD ?07/11/2021,1:48 AM ? ? ?

## 2021-07-11 NOTE — Discharge Summary (Signed)
? ?Physician Discharge Summary  ? ? ? ? ?Patient ID: ?Hayley Knight ?MRN: [id number] ?DOB/AGE: [birth year] 86 y.o. ? ?Admit date: 07/09/2021 ?Discharge date: 07/11/2021 ? ?Primary Discharge Diagnosis shortness of breath pulm hypertension complex CTO coronary artery disease ?Secondary Discharge Diagnosis pulm hypertension dyspnea reflux ? ?Significant Diagnostic Studies: angiography: Cardiac cath and yes ? ?Consults: None ? ?Hospital Course: Patient was initially brought to the cardiac Cath Lab as an outpatient for right and left heart cath for evaluation of pulmonary hypertension and coronary disease patient tolerated the cath well on 07/09/2021 found to have mild pulmonary hypertension preserved left ventricular function normal left coronary system 100% proximal RCA CTO.  Procedure was done radially done really reasonably well but had some postprocedural chest pain was placed on heparin nitroglycerin aspirin and arrangements were made for intervention to be done at [facility].  Patient was then transferred on 07/11/2021 ? ? ?Discharge Exam: ? ?Blood pressure (!) 118/59, pulse 67, temperature 98.5 ?F (36.9 ?C), resp. rate 18, height '4\' 11"'$  (1.499 m), weight 90.3 kg, SpO2 97 %.   ?General appearance: alert and cooperative ?Neck: no adenopathy, no carotid bruit, no JVD, supple, symmetrical, trachea midline, and thyroid not enlarged, symmetric, no tenderness/mass/nodules ?Chest wall: no tenderness ?Cardio: regular rate and rhythm, S1, S2 normal, no murmur, click, rub or gallop ?GI: soft, non-tender; bowel sounds normal; no masses,  no organomegaly ?Extremities: extremities normal, atraumatic, no cyanosis or edema ?Pulses: 2+ and symmetric ?Labs: ?  ?Lab Results  ?Component Value Date  ? WBC 12.2 (H) 07/10/2021  ? HGB 11.4 (L) 07/10/2021  ? HCT 34.8 (L) 07/10/2021  ? MCV 92.3 07/10/2021  ? PLT 289 07/10/2021  ? No results for input(s): NA, K, CL, CO2, BUN, CREATININE, CALCIUM, PROT, BILITOT, ALKPHOS, ALT, AST, GLUCOSE in the  last 168 hours. ? ?Invalid input(s): LABALBU ? ?  ?Radiology: Cardiac cath with high-grade lesion in the proximal RCA CTO ?EKG: Normal sinus rhythm nonspecific ST-T changes rate of 65 ? ?FOLLOW UP PLANS AND APPOINTMENTS ? ?Allergies as of 07/11/2021   ? ?   Reactions  ? Ivp Dye [iodinated Contrast Media] Swelling  ? Sulfa Antibiotics Swelling  ? ?  ? ?  ?Medication List  ?  ? ?STOP taking these medications   ? ?diphenhydrAMINE 25 MG tablet ?Commonly known as: BENADRYL ?  ? ?  ? ?TAKE these medications   ? ?amLODipine 2.5 MG tablet ?Commonly known as: NORVASC ?Take 2.5 mg by mouth daily. ?  ?aspirin 81 MG EC tablet ?Take 81 mg by mouth at bedtime. ?  ?ciprofloxacin 0.3 % ophthalmic solution ?Commonly known as: CILOXAN ?Place 1 drop into both eyes daily as needed (eye redness). ?  ?dexlansoprazole 60 MG capsule ?Commonly known as: DEXILANT ?Take 60 mg by mouth daily. ?  ?diazepam 5 MG tablet ?Commonly known as: VALIUM ?Take 5 mg by mouth every 12 (twelve) hours as needed for anxiety. ?  ?escitalopram 20 MG tablet ?Commonly known as: LEXAPRO ?Take 20 mg by mouth daily. ?  ?famotidine 20 MG tablet ?Commonly known as: PEPCID ?Take 20 mg by mouth 2 (two) times daily. ?  ?fenofibrate 145 MG tablet ?Commonly known as: TRICOR ?Take 145 mg by mouth at bedtime. ?  ?fluticasone 0.05 % cream ?Commonly known as: CUTIVATE ?Apply 1 application. topically daily as needed for rash. ?  ?furosemide 40 MG tablet ?Commonly known as: LASIX ?Take 40 mg by mouth daily. ?  ?gabapentin 300 MG capsule ?Commonly known as: NEURONTIN ?Take 300 mg  by mouth at bedtime. ?  ?heparin 25000 UT/250ML infusion ?Inject 800 Units/hr into the vein continuous for 4 days. ?  ?losartan 100 MG tablet ?Commonly known as: COZAAR ?Take 100 mg by mouth daily. ?  ?multivitamin capsule ?Take 2 capsules by mouth daily. ?  ?naproxen sodium 220 MG tablet ?Commonly known as: ALEVE ?Take 440 mg by mouth daily as needed (pain). ?  ?nitroGLYCERIN 2 % ointment ?Commonly known  as: NITROGLYN ?Apply 0.5 inches topically every 6 (six) hours. ?  ?omega-3 acid ethyl esters 1 g capsule ?Commonly known as: LOVAZA ?Take 2 g by mouth 2 (two) times daily. ?  ?ondansetron 4 MG/2ML Soln injection ?Commonly known as: ZOFRAN ?Inject 2 mLs (4 mg total) into the vein every 6 (six) hours as needed for nausea. ?  ?ONE A DAY IMMUNITY DEFENSE PO ?Take 2 tablets by mouth daily. ?  ?oxyCODONE 10 mg 12 hr tablet ?Commonly known as: OXYCONTIN ?Take 10-20 mg by mouth See admin instructions. Take 20 mg in the morning, 10 mg in the afternoon, and 10 mg at bedtime ?  ?potassium chloride 10 MEQ tablet ?Commonly known as: KLOR-CON ?Take 10 mEq by mouth daily. ?  ?predniSONE 5 MG tablet ?Commonly known as: DELTASONE ?Take 5 mg by mouth in the morning and at bedtime. ?What changed: Another medication with the same name was removed. Continue taking this medication, and follow the directions you see here. ?  ?sitaGLIPtin 100 MG tablet ?Commonly known as: JANUVIA ?Take 100 mg by mouth daily. ?  ? ?  ? ? ? ?BRING ALL MEDICATIONS WITH YOU TO FOLLOW UP APPOINTMENTS ? ?Time spent with patient to include physician time:  ?Signed: ? ?Yolonda Kida MD, ?07/11/2021, 12:42 AM ? ?  ?

## 2021-07-11 NOTE — Progress Notes (Signed)
Patient has left/ been transferred out of the unit for [facility] ?

## 2021-07-11 NOTE — Plan of Care (Signed)
Care plan and education are complete ?

## 2021-07-16 DIAGNOSIS — M542 Cervicalgia: Secondary | ICD-10-CM | POA: Diagnosis not present

## 2021-07-16 DIAGNOSIS — M545 Low back pain, unspecified: Secondary | ICD-10-CM | POA: Diagnosis not present

## 2021-07-16 DIAGNOSIS — R52 Pain, unspecified: Secondary | ICD-10-CM | POA: Diagnosis not present

## 2021-07-16 DIAGNOSIS — M25521 Pain in right elbow: Secondary | ICD-10-CM | POA: Diagnosis not present

## 2021-07-16 DIAGNOSIS — M25511 Pain in right shoulder: Secondary | ICD-10-CM | POA: Diagnosis not present

## 2021-07-16 DIAGNOSIS — E1165 Type 2 diabetes mellitus with hyperglycemia: Secondary | ICD-10-CM | POA: Diagnosis not present

## 2021-07-17 ENCOUNTER — Ambulatory Visit: Payer: Self-pay | Admitting: Urology

## 2021-07-22 DIAGNOSIS — I10 Essential (primary) hypertension: Secondary | ICD-10-CM | POA: Diagnosis not present

## 2021-07-22 DIAGNOSIS — I2510 Atherosclerotic heart disease of native coronary artery without angina pectoris: Secondary | ICD-10-CM | POA: Diagnosis not present

## 2021-07-22 DIAGNOSIS — E785 Hyperlipidemia, unspecified: Secondary | ICD-10-CM | POA: Diagnosis not present

## 2021-07-22 DIAGNOSIS — R7303 Prediabetes: Secondary | ICD-10-CM | POA: Diagnosis not present

## 2021-07-22 DIAGNOSIS — R5383 Other fatigue: Secondary | ICD-10-CM | POA: Diagnosis not present

## 2021-07-23 ENCOUNTER — Ambulatory Visit: Payer: Medicare Other | Admitting: Urology

## 2021-07-23 DIAGNOSIS — I214 Non-ST elevation (NSTEMI) myocardial infarction: Secondary | ICD-10-CM | POA: Diagnosis not present

## 2021-07-23 DIAGNOSIS — Z955 Presence of coronary angioplasty implant and graft: Secondary | ICD-10-CM | POA: Diagnosis not present

## 2021-07-28 ENCOUNTER — Encounter: Payer: Self-pay | Admitting: Urology

## 2021-07-28 ENCOUNTER — Ambulatory Visit: Payer: Medicare Other | Admitting: Urology

## 2021-07-28 VITALS — BP 132/75 | HR 102 | Ht 59.0 in | Wt 198.0 lb

## 2021-07-28 DIAGNOSIS — N289 Disorder of kidney and ureter, unspecified: Secondary | ICD-10-CM | POA: Diagnosis not present

## 2021-07-28 LAB — MICROSCOPIC EXAMINATION: WBC, UA: >30 /hpf — ABNORMAL HIGH (ref 0–5)

## 2021-07-28 LAB — URINALYSIS, COMPLETE
Bilirubin, UA: NEGATIVE
Glucose, UA: NEGATIVE
Ketones, UA: NEGATIVE
Nitrite, UA: NEGATIVE
Protein,UA: NEGATIVE
RBC, UA: NEGATIVE
Specific Gravity, UA: 1.01 (ref 1.005–1.030)
Urobilinogen, Ur: 0.2 mg/dL (ref 0.2–1.0)
pH, UA: 6 (ref 5.0–7.5)

## 2021-07-28 NOTE — Progress Notes (Signed)
? ?07/28/2021 ?4:29 PM  ? ?Hayley Knight ?10-08-1935 ?[id number] ? ?Referring provider: Danelle Berry, NP ?[address] ?[place],  [place] [address] ? ?Chief Complaint  ?Patient presents with  ? Other  ? ? ?HPI: ?Hayley Knight is a 86 y.o. female referred for evaluation of decreased renal function. ? ?Gives history of chronic kidney disease and states her GFR has decreased recently from 7 in mid March 2023 and at her PCP was recently 57 ?Has been seen by [facility] in the past ?No bothersome LUTS ?Denies dysuria, gross hematuria ?Denies flank, abdominal or pelvic pain ? ? ?PMH: ?Past Medical History:  ?Diagnosis Date  ? Arthritis   ? Diabetes mellitus without complication ([place])   ? Hypertension   ? ? ?Surgical History: ?Past Surgical History:  ?Procedure Laterality Date  ? ABDOMINAL HYSTERECTOMY    ? APPENDECTOMY    ? CHOLECYSTECTOMY    ? MASTECTOMY Right   ? REPLACEMENT TOTAL KNEE BILATERAL    ? RIGHT/LEFT HEART CATH AND CORONARY ANGIOGRAPHY Bilateral 07/09/2021  ? Procedure: RIGHT/LEFT HEART CATH AND CORONARY ANGIOGRAPHY;  Surgeon: Yolonda Kida, MD;  Location: [facility];  Service: Cardiovascular;  Laterality: Bilateral;  ? ? ?Home Medications:  ?Allergies as of 07/28/2021   ? ?   Reactions  ? Ivp Dye [iodinated Contrast Media] Swelling  ? Sulfa Antibiotics Swelling  ? ?  ? ?  ?Medication List  ?  ? ?  ? Accurate as of July 28, 2021  4:29 PM. If you have any questions, ask your nurse or doctor.  ?  ?  ? ?  ? ?STOP taking these medications   ? ?heparin 100 units/mL Soln ?Stopped by: Abbie Sons, MD ?  ?heparin 25000 UT/250ML infusion ?Stopped by: Abbie Sons, MD ?  ? ?  ? ?TAKE these medications   ? ?amLODipine 2.5 MG tablet ?Commonly known as: NORVASC ?Take 2.5 mg by mouth daily. ?  ?aspirin 81 MG EC tablet ?Take 81 mg by mouth at bedtime. ?  ?ciprofloxacin 0.3 % ophthalmic solution ?Commonly known as: CILOXAN ?Place 1 drop into both eyes daily as needed (eye redness). ?   ?dexlansoprazole 60 MG capsule ?Commonly known as: DEXILANT ?Take 60 mg by mouth daily. ?  ?diazepam 5 MG tablet ?Commonly known as: VALIUM ?Take 5 mg by mouth every 12 (twelve) hours as needed for anxiety. ?  ?escitalopram 20 MG tablet ?Commonly known as: LEXAPRO ?Take 20 mg by mouth daily. ?  ?famotidine 20 MG tablet ?Commonly known as: PEPCID ?Take 20 mg by mouth 2 (two) times daily. ?  ?fenofibrate 145 MG tablet ?Commonly known as: TRICOR ?Take 145 mg by mouth at bedtime. ?  ?fluticasone 0.05 % cream ?Commonly known as: CUTIVATE ?Apply 1 application. topically daily as needed for rash. ?  ?furosemide 40 MG tablet ?Commonly known as: LASIX ?Take 40 mg by mouth daily. ?  ?gabapentin 300 MG capsule ?Commonly known as: NEURONTIN ?Take 300 mg by mouth at bedtime. ?  ?losartan 100 MG tablet ?Commonly known as: COZAAR ?Take 100 mg by mouth daily. ?  ?multivitamin capsule ?Take 2 capsules by mouth daily. ?  ?naproxen sodium 220 MG tablet ?Commonly known as: ALEVE ?Take 440 mg by mouth daily as needed (pain). ?  ?nitroGLYCERIN 2 % ointment ?Commonly known as: NITROGLYN ?Apply 0.5 inches topically every 6 (six) hours. ?  ?omega-3 acid ethyl esters 1 g capsule ?Commonly known as: LOVAZA ?Take 2 g by mouth 2 (two) times  daily. ?  ?ondansetron 4 MG/2ML Soln injection ?Commonly known as: ZOFRAN ?Inject 2 mLs (4 mg total) into the vein every 6 (six) hours as needed for nausea. ?  ?ONE A DAY IMMUNITY DEFENSE PO ?Take 2 tablets by mouth daily. ?  ?oxyCODONE 10 mg 12 hr tablet ?Commonly known as: OXYCONTIN ?Take 10-20 mg by mouth See admin instructions. Take 20 mg in the morning, 10 mg in the afternoon, and 10 mg at bedtime ?  ?potassium chloride 10 MEQ tablet ?Commonly known as: KLOR-CON ?Take 10 mEq by mouth daily. ?  ?predniSONE 5 MG tablet ?Commonly known as: DELTASONE ?Take 5 mg by mouth in the morning and at bedtime. ?  ?sitaGLIPtin 100 MG tablet ?Commonly known as: JANUVIA ?Take 100 mg by mouth daily. ?  ? ?   ? ? ?Allergies:  ?Allergies  ?Allergen Reactions  ? Ivp Dye [Iodinated Contrast Media] Swelling  ? Sulfa Antibiotics Swelling  ? ? ?Family History: ?No family history on file. ? ?Social History:  reports that she has never smoked. She has never used smokeless tobacco. She reports that she does not drink alcohol and does not use drugs. ? ? ?Physical Exam: ?BP 132/75   Pulse (!) 102   Ht '4\' 11"'$  (1.499 m)   Wt 198 lb (89.8 kg)   BMI 39.99 kg/m?   ?Constitutional:  Alert and oriented, No acute distress. ?Respiratory: Normal respiratory effort, no increased work of breathing. ?Psychiatric: Normal mood and affect. ? ? ? ?Assessment & Plan:   ? ?1.  Chronic kidney disease ?I discussed with Ms. Fauver that urologists would only treat postrenal causes that would affect kidney function ?I ordered a renal ultrasound and if no evidence of hydronephrosis or poor bladder emptying she would need a nephrology evaluation ?Urinalysis today showed pyuria and bacteriuria.  She is asymptomatic and the treatment of asymptomatic bacteriuria is not recommended. ? ? ?Abbie Sons, MD ? ?[place] ?[address], [address] ?[place], [place] [address] ?[phone] ? ?

## 2021-07-30 ENCOUNTER — Encounter: Payer: Medicare Other | Attending: Internal Medicine

## 2021-07-30 ENCOUNTER — Other Ambulatory Visit: Payer: Self-pay

## 2021-07-30 DIAGNOSIS — Z955 Presence of coronary angioplasty implant and graft: Secondary | ICD-10-CM

## 2021-07-30 NOTE — Progress Notes (Signed)
Virtual Visit completed. Patient informed on EP and RD appointment and 6 Minute walk test. Patient also informed of patient health questionnaires on My Chart. Patient Verbalizes understanding. Visit diagnosis can be found in [facility] 07/11/2021. ?

## 2021-07-31 ENCOUNTER — Telehealth: Payer: Self-pay

## 2021-07-31 NOTE — Telephone Encounter (Signed)
Patient called and would like to get a RUS. ?

## 2021-08-03 ENCOUNTER — Encounter: Payer: Self-pay | Admitting: Urology

## 2021-08-03 NOTE — Telephone Encounter (Signed)
RUS has been ordered. Radiology will call to sched ?

## 2021-08-05 DIAGNOSIS — E1122 Type 2 diabetes mellitus with diabetic chronic kidney disease: Secondary | ICD-10-CM | POA: Diagnosis not present

## 2021-08-05 DIAGNOSIS — H109 Unspecified conjunctivitis: Secondary | ICD-10-CM | POA: Diagnosis not present

## 2021-08-05 DIAGNOSIS — I10 Essential (primary) hypertension: Secondary | ICD-10-CM | POA: Diagnosis not present

## 2021-08-05 DIAGNOSIS — N189 Chronic kidney disease, unspecified: Secondary | ICD-10-CM | POA: Diagnosis not present

## 2021-08-07 DIAGNOSIS — G894 Chronic pain syndrome: Secondary | ICD-10-CM | POA: Diagnosis not present

## 2021-08-13 ENCOUNTER — Ambulatory Visit: Payer: Medicare Other | Admitting: Dermatology

## 2021-08-20 ENCOUNTER — Ambulatory Visit: Payer: Medicare Other

## 2021-08-25 ENCOUNTER — Other Ambulatory Visit: Payer: Self-pay

## 2021-08-25 ENCOUNTER — Ambulatory Visit: Payer: Medicare Other | Admitting: Urology

## 2021-08-25 VITALS — BP 124/78 | HR 88 | Ht 59.0 in | Wt 198.0 lb

## 2021-08-25 DIAGNOSIS — R32 Unspecified urinary incontinence: Secondary | ICD-10-CM | POA: Diagnosis not present

## 2021-08-25 DIAGNOSIS — N3946 Mixed incontinence: Secondary | ICD-10-CM

## 2021-08-25 LAB — MICROSCOPIC EXAMINATION: WBC, UA: >30 /hpf — AB (ref 0–5)

## 2021-08-25 LAB — URINALYSIS, COMPLETE
Bilirubin, UA: NEGATIVE
Glucose, UA: NEGATIVE
Ketones, UA: NEGATIVE
Nitrite, UA: NEGATIVE
Protein,UA: NEGATIVE
RBC, UA: NEGATIVE
Specific Gravity, UA: 1.015 (ref 1.005–1.030)
Urobilinogen, Ur: 0.2 mg/dL (ref 0.2–1.0)
pH, UA: 6 (ref 5.0–7.5)

## 2021-08-25 MED ORDER — MIRABEGRON ER 25 MG PO TB24: 25.0000 mg | ORAL_TABLET | Freq: Every day | ORAL | 11 refills | Status: DC

## 2021-08-25 NOTE — Progress Notes (Signed)
? ?08/25/2021 ?1:42 PM  ? ?Hayley Knight ?[birth year] ?[id number] ? ?Referring provider: Danelle Berry, NP ?[address] ?[place],  [place] [address] ? ?Chief Complaint  ?Patient presents with  ? Urinary Incontinence  ? ? ?HPI: ?I was consulted to assess the patient's prolapse worsening over 1 month.  She can feel vaginal bulging.  She has had a hysterectomy ?  ?At baseline she leaks with coughing sneezing and sometimes with bending and lifting.  She has urge incontinence.  She wears usually 1 pad a day.  She denies bedwetting ?  ?She voids every 1 or 2 hours and has no nocturia.  Flow varies.  5 minutes later she can double void and increased amount. ?  ?High small grade 2 cystocele and mild smaller grade 2 distal rectocele.  Exam limited by obesity ? ?She has prolapse symptoms of mild mixed incontinence.  She has frequency and nocturia.  The patient says she can feel something in the vagina but also thinks there is pressure in the left groin.  A picture was drawn.  She really did not have much prolapse and is not bothersome.  If she ever had surgery I would need to order urodynamics and reexamine her and study the size of the cystocele fluoroscopically.  Watchful waiting at this stage I think is the good option for her.  There was virtually no cystocele clinically today and again the rectocele was not that impressive either. ?  ?Today ?I have not seen the patient for approximately 3 years.  She saw my partner a few weeks ago.  She had chronic renal insufficiency with a GFR of 35.  It looks like the ultrasound was never done but may be pending Aug 27, 2021. ? ?Her main complaint today is high-volume bedwetting.  The pad is soaked.  She wears 1 or 2 during the day that are damp for urge incontinence and mild stress incontinence.  Voids approximately every 1 hour.  Clinically not infected ? ?PMH: ?Past Medical History:  ?Diagnosis Date  ? Arthritis   ? Diabetes mellitus without complication ([place])   ? Hypertension    ? ? ?Surgical History: ?Past Surgical History:  ?Procedure Laterality Date  ? ABDOMINAL HYSTERECTOMY    ? APPENDECTOMY    ? CHOLECYSTECTOMY    ? MASTECTOMY Right   ? REPLACEMENT TOTAL KNEE BILATERAL    ? RIGHT/LEFT HEART CATH AND CORONARY ANGIOGRAPHY Bilateral 07/09/2021  ? Procedure: RIGHT/LEFT HEART CATH AND CORONARY ANGIOGRAPHY;  Surgeon: Yolonda Kida, MD;  Location: [facility];  Service: Cardiovascular;  Laterality: Bilateral;  ? ? ?Home Medications:  ?Allergies as of 08/25/2021   ? ?   Reactions  ? Ivp Dye [iodinated Contrast Media] Swelling  ? Sulfa Antibiotics Swelling  ? ?  ? ?  ?Medication List  ?  ? ?  ? Accurate as of Aug 25, 2021  1:42 PM. If you have any questions, ask your nurse or doctor.  ?  ?  ? ?  ? ?STOP taking these medications   ? ?ciprofloxacin 0.3 % ophthalmic solution ?Commonly known as: CILOXAN ?  ? ?  ? ?TAKE these medications   ? ?amLODipine 2.5 MG tablet ?Commonly known as: NORVASC ?Take 2.5 mg by mouth daily. ?  ?aspirin 81 MG EC tablet ?Take 81 mg by mouth at bedtime. ?What changed: Another medication with the same name was removed. Continue taking this medication, and follow the directions you see here. ?  ?clopidogrel 75 MG tablet ?  Commonly known as: PLAVIX ?Take by mouth. ?  ?dexlansoprazole 60 MG capsule ?Commonly known as: DEXILANT ?Take 60 mg by mouth daily. ?  ?diazepam 5 MG tablet ?Commonly known as: VALIUM ?Take 5 mg by mouth every 12 (twelve) hours as needed for anxiety. ?  ?escitalopram 20 MG tablet ?Commonly known as: LEXAPRO ?Take 20 mg by mouth daily. ?  ?famotidine 20 MG tablet ?Commonly known as: PEPCID ?Take 20 mg by mouth 2 (two) times daily. ?  ?fenofibrate 145 MG tablet ?Commonly known as: TRICOR ?Take 145 mg by mouth at bedtime. ?What changed: Another medication with the same name was removed. Continue taking this medication, and follow the directions you see here. ?  ?fluticasone 0.05 % cream ?Commonly known as: CUTIVATE ?Apply 1 application. topically  daily as needed for rash. ?  ?furosemide 40 MG tablet ?Commonly known as: LASIX ?Take 40 mg by mouth daily. ?  ?gabapentin 300 MG capsule ?Commonly known as: NEURONTIN ?Take 300 mg by mouth at bedtime. ?  ?hydrochlorothiazide 12.5 MG tablet ?Commonly known as: HYDRODIURIL ?Take 12.5 mg by mouth every morning. ?  ?losartan 100 MG tablet ?Commonly known as: COZAAR ?Take 100 mg by mouth daily. ?  ?melatonin 3 MG Tabs tablet ?Take by mouth. ?  ?multivitamin capsule ?Take 2 capsules by mouth daily. ?  ?naproxen sodium 220 MG tablet ?Commonly known as: ALEVE ?Take 440 mg by mouth daily as needed (pain). ?  ?nitroGLYCERIN 2 % ointment ?Commonly known as: NITROGLYN ?Apply 0.5 inches topically every 6 (six) hours. ?  ?nitroGLYCERIN 0.4 MG SL tablet ?Commonly known as: NITROSTAT ?Place under the tongue. ?  ?omega-3 acid ethyl esters 1 g capsule ?Commonly known as: LOVAZA ?Take 2 g by mouth 2 (two) times daily. ?  ?ondansetron 4 MG/2ML Soln injection ?Commonly known as: ZOFRAN ?Inject 2 mLs (4 mg total) into the vein every 6 (six) hours as needed for nausea. ?  ?ondansetron 4 MG tablet ?Commonly known as: ZOFRAN ?Take 4 mg by mouth every 6 (six) hours as needed. ?  ?ONE A DAY IMMUNITY DEFENSE PO ?Take 2 tablets by mouth daily. ?  ?oxyCODONE 10 mg 12 hr tablet ?Commonly known as: OXYCONTIN ?Take 10-20 mg by mouth See admin instructions. Take 20 mg in the morning, 10 mg in the afternoon, and 10 mg at bedtime ?  ?potassium chloride 10 MEQ tablet ?Commonly known as: KLOR-CON ?Take 10 mEq by mouth daily. ?  ?predniSONE 5 MG tablet ?Commonly known as: DELTASONE ?Take 5 mg by mouth in the morning and at bedtime. ?  ?rosuvastatin 20 MG tablet ?Commonly known as: CRESTOR ?Take 1 tablet by mouth at bedtime. ?  ?sitaGLIPtin 100 MG tablet ?Commonly known as: JANUVIA ?Take 100 mg by mouth daily. ?  ? ?  ? ? ?Allergies:  ?Allergies  ?Allergen Reactions  ? Ivp Dye [Iodinated Contrast Media] Swelling  ? Sulfa Antibiotics Swelling  ? ? ?Family  History: ?No family history on file. ? ?Social History:  reports that she has never smoked. She has never used smokeless tobacco. She reports that she does not drink alcohol and does not use drugs. ? ?ROS: ?  ? ?  ? ?  ? ?  ? ?  ? ?  ? ?  ? ?  ? ?  ? ?  ? ?  ? ?  ? ?  ? ?Physical Exam: ? ? ?Laboratory Data: ?Lab Results  ?Component Value Date  ? WBC 12.2 (H) 07/10/2021  ? HGB 11.4 (L)  07/10/2021  ? HCT 34.8 (L) 07/10/2021  ? MCV 92.3 07/10/2021  ? PLT 289 07/10/2021  ? ? ?Lab Results  ?Component Value Date  ? CREATININE 0.97 03/16/2015  ? ? ?No results found for: PSA ? ?No results found for: TESTOSTERONE ? ?Lab Results  ?Component Value Date  ? HGBA1C 5.7 03/16/2015  ? ? ?Urinalysis ?   ?Component Value Date/Time  ? COLORURINE YELLOW (A) 03/16/2015 2133  ? APPEARANCEUR Clear 07/28/2021 1416  ? LABSPEC 1.010 03/16/2015 2133  ? LABSPEC 1.015 06/02/2014 1526  ? PHURINE 6.0 03/16/2015 2133  ? GLUCOSEU Negative 07/28/2021 1416  ? GLUCOSEU Negative 06/02/2014 1526  ? HGBUR 2+ (A) 03/16/2015 2133  ? BILIRUBINUR Negative 07/28/2021 1416  ? BILIRUBINUR Negative 06/02/2014 1526  ? [place] NEGATIVE 03/16/2015 2133  ? PROTEINUR Negative 07/28/2021 1416  ? [place] NEGATIVE 03/16/2015 2133  ? NITRITE Negative 07/28/2021 1416  ? NITRITE NEGATIVE 03/16/2015 2133  ? LEUKOCYTESUR Trace (A) 07/28/2021 1416  ? LEUKOCYTESUR Negative 06/02/2014 1526  ? ? ?Pertinent Imaging: ? ? ?Assessment & Plan: Reassess patient in 5 or 6 weeks on Myrbetriq 25 mg samples and prescription due to renal insufficiency.  Call if culture positive.  Perform cystoscopy on that day. ? ?1. Urinary incontinence, unspecified type ? ?- Urinalysis, Complete ? ? ?No follow-ups on file. ? ?Reece Packer, MD ? ?[facility] ?[address] ?[place], [place] [address] ?[phone]  ?

## 2021-08-25 NOTE — Patient Instructions (Signed)

## 2021-08-25 NOTE — Addendum Note (Signed)
Addended by: Alvera Novel on: 08/25/2021 02:17 PM ? ? Modules accepted: Orders ? ?

## 2021-08-27 ENCOUNTER — Ambulatory Visit
Admission: RE | Admit: 2021-08-27 | Discharge: 2021-08-27 | Disposition: A | Discharge status: Home / Self Care | Payer: Medicare Other | Source: Ambulatory Visit | Attending: Urology | Admitting: Urology

## 2021-08-27 DIAGNOSIS — N289 Disorder of kidney and ureter, unspecified: Secondary | ICD-10-CM | POA: Insufficient documentation

## 2021-08-27 DIAGNOSIS — K7689 Other specified diseases of liver: Secondary | ICD-10-CM | POA: Diagnosis not present

## 2021-08-27 DIAGNOSIS — N281 Cyst of kidney, acquired: Secondary | ICD-10-CM | POA: Diagnosis not present

## 2021-08-27 DIAGNOSIS — R160 Hepatomegaly, not elsewhere classified: Secondary | ICD-10-CM | POA: Diagnosis not present

## 2021-08-27 DIAGNOSIS — R944 Abnormal results of kidney function studies: Secondary | ICD-10-CM | POA: Diagnosis not present

## 2021-08-29 ENCOUNTER — Telehealth: Payer: Self-pay | Admitting: *Deleted

## 2021-08-29 LAB — CULTURE, URINE COMPREHENSIVE

## 2021-08-29 NOTE — Telephone Encounter (Signed)
Notified patient as instructed, patient pleased °

## 2021-08-29 NOTE — Telephone Encounter (Signed)
-----   Message from Abbie Sons, MD sent at 08/29/2021  7:25 AM EDT ----- ?Renal ultrasound showed no abnormalities ?

## 2021-09-01 DIAGNOSIS — I10 Essential (primary) hypertension: Secondary | ICD-10-CM | POA: Diagnosis not present

## 2021-09-01 DIAGNOSIS — E785 Hyperlipidemia, unspecified: Secondary | ICD-10-CM | POA: Diagnosis not present

## 2021-09-01 DIAGNOSIS — E1122 Type 2 diabetes mellitus with diabetic chronic kidney disease: Secondary | ICD-10-CM | POA: Diagnosis not present

## 2021-09-01 DIAGNOSIS — R7303 Prediabetes: Secondary | ICD-10-CM | POA: Diagnosis not present

## 2021-09-04 DIAGNOSIS — E785 Hyperlipidemia, unspecified: Secondary | ICD-10-CM | POA: Diagnosis not present

## 2021-09-04 DIAGNOSIS — I10 Essential (primary) hypertension: Secondary | ICD-10-CM | POA: Diagnosis not present

## 2021-09-04 DIAGNOSIS — R7303 Prediabetes: Secondary | ICD-10-CM | POA: Diagnosis not present

## 2021-09-04 DIAGNOSIS — G8929 Other chronic pain: Secondary | ICD-10-CM | POA: Diagnosis not present

## 2021-09-04 DIAGNOSIS — N189 Chronic kidney disease, unspecified: Secondary | ICD-10-CM | POA: Diagnosis not present

## 2021-09-04 DIAGNOSIS — K219 Gastro-esophageal reflux disease without esophagitis: Secondary | ICD-10-CM | POA: Diagnosis not present

## 2021-09-10 DIAGNOSIS — R52 Pain, unspecified: Secondary | ICD-10-CM | POA: Diagnosis not present

## 2021-09-10 DIAGNOSIS — M25511 Pain in right shoulder: Secondary | ICD-10-CM | POA: Diagnosis not present

## 2021-09-10 DIAGNOSIS — M25521 Pain in right elbow: Secondary | ICD-10-CM | POA: Diagnosis not present

## 2021-09-10 DIAGNOSIS — M545 Low back pain, unspecified: Secondary | ICD-10-CM | POA: Diagnosis not present

## 2021-09-10 DIAGNOSIS — E1165 Type 2 diabetes mellitus with hyperglycemia: Secondary | ICD-10-CM | POA: Diagnosis not present

## 2021-09-10 DIAGNOSIS — M542 Cervicalgia: Secondary | ICD-10-CM | POA: Diagnosis not present

## 2021-09-15 ENCOUNTER — Telehealth: Payer: Self-pay

## 2021-09-15 DIAGNOSIS — B9620 Unspecified Escherichia coli [E. coli] as the cause of diseases classified elsewhere: Secondary | ICD-10-CM

## 2021-09-15 MED ORDER — NITROFURANTOIN MACROCRYSTAL 100 MG PO CAPS: 100.0000 mg | ORAL_CAPSULE | Freq: Two times a day (BID) | ORAL | 0 refills | Status: AC

## 2021-09-15 NOTE — Telephone Encounter (Signed)
Incoming call from pt who states she is requesting the results of her urine culture, please advise.

## 2021-09-15 NOTE — Telephone Encounter (Signed)
Hayley Loser, MD  You 4 hours ago (11:47 AM)   I am not sure why this culture was not treated  It was a positive culture and should be treated with Macrodantin 100 mg twice a day for 7 days    Called pt informed her of the information above. Pt voiced understanding. RX sent.

## 2021-10-07 ENCOUNTER — Telehealth: Payer: Self-pay | Admitting: Urology

## 2021-10-07 NOTE — Telephone Encounter (Signed)
Erron  

## 2021-10-20 ENCOUNTER — Other Ambulatory Visit: Payer: Medicare Other | Admitting: Urology

## 2021-10-21 ENCOUNTER — Emergency Department: Payer: Medicare Other

## 2021-10-21 ENCOUNTER — Encounter: Payer: Self-pay | Admitting: *Deleted

## 2021-10-21 ENCOUNTER — Other Ambulatory Visit: Payer: Self-pay

## 2021-10-21 ENCOUNTER — Observation Stay
Admission: EM | Admit: 2021-10-21 | Discharge: 2021-10-24 | Disposition: A | Discharge status: Home / Self Care | Payer: Medicare Other | Attending: Internal Medicine | Admitting: Internal Medicine

## 2021-10-21 DIAGNOSIS — N183 Chronic kidney disease, stage 3 unspecified: Secondary | ICD-10-CM | POA: Diagnosis present

## 2021-10-21 DIAGNOSIS — J90 Pleural effusion, not elsewhere classified: Secondary | ICD-10-CM | POA: Diagnosis not present

## 2021-10-21 DIAGNOSIS — D75839 Thrombocytosis, unspecified: Secondary | ICD-10-CM | POA: Insufficient documentation

## 2021-10-21 DIAGNOSIS — Z7902 Long term (current) use of antithrombotics/antiplatelets: Secondary | ICD-10-CM | POA: Insufficient documentation

## 2021-10-21 DIAGNOSIS — Z9181 History of falling: Secondary | ICD-10-CM

## 2021-10-21 DIAGNOSIS — E1122 Type 2 diabetes mellitus with diabetic chronic kidney disease: Secondary | ICD-10-CM | POA: Diagnosis not present

## 2021-10-21 DIAGNOSIS — R059 Cough, unspecified: Secondary | ICD-10-CM | POA: Diagnosis not present

## 2021-10-21 DIAGNOSIS — Z853 Personal history of malignant neoplasm of breast: Secondary | ICD-10-CM | POA: Insufficient documentation

## 2021-10-21 DIAGNOSIS — F419 Anxiety disorder, unspecified: Secondary | ICD-10-CM | POA: Diagnosis present

## 2021-10-21 DIAGNOSIS — E871 Hypo-osmolality and hyponatremia: Secondary | ICD-10-CM

## 2021-10-21 DIAGNOSIS — N1832 Chronic kidney disease, stage 3b: Secondary | ICD-10-CM | POA: Diagnosis not present

## 2021-10-21 DIAGNOSIS — I2510 Atherosclerotic heart disease of native coronary artery without angina pectoris: Secondary | ICD-10-CM | POA: Diagnosis not present

## 2021-10-21 DIAGNOSIS — N179 Acute kidney failure, unspecified: Secondary | ICD-10-CM | POA: Diagnosis not present

## 2021-10-21 DIAGNOSIS — R160 Hepatomegaly, not elsewhere classified: Secondary | ICD-10-CM | POA: Insufficient documentation

## 2021-10-21 DIAGNOSIS — Z79899 Other long term (current) drug therapy: Secondary | ICD-10-CM | POA: Insufficient documentation

## 2021-10-21 DIAGNOSIS — Z7982 Long term (current) use of aspirin: Secondary | ICD-10-CM | POA: Diagnosis not present

## 2021-10-21 DIAGNOSIS — E876 Hypokalemia: Secondary | ICD-10-CM | POA: Insufficient documentation

## 2021-10-21 DIAGNOSIS — R0602 Shortness of breath: Secondary | ICD-10-CM

## 2021-10-21 DIAGNOSIS — I129 Hypertensive chronic kidney disease with stage 1 through stage 4 chronic kidney disease, or unspecified chronic kidney disease: Secondary | ICD-10-CM | POA: Insufficient documentation

## 2021-10-21 DIAGNOSIS — Z7984 Long term (current) use of oral hypoglycemic drugs: Secondary | ICD-10-CM | POA: Diagnosis not present

## 2021-10-21 DIAGNOSIS — D649 Anemia, unspecified: Secondary | ICD-10-CM | POA: Insufficient documentation

## 2021-10-21 DIAGNOSIS — R0600 Dyspnea, unspecified: Secondary | ICD-10-CM | POA: Insufficient documentation

## 2021-10-21 DIAGNOSIS — I10 Essential (primary) hypertension: Secondary | ICD-10-CM | POA: Diagnosis present

## 2021-10-21 DIAGNOSIS — R051 Acute cough: Secondary | ICD-10-CM

## 2021-10-21 DIAGNOSIS — Z743 Need for continuous supervision: Secondary | ICD-10-CM | POA: Diagnosis not present

## 2021-10-21 DIAGNOSIS — E119 Type 2 diabetes mellitus without complications: Secondary | ICD-10-CM

## 2021-10-21 DIAGNOSIS — E1165 Type 2 diabetes mellitus with hyperglycemia: Secondary | ICD-10-CM

## 2021-10-21 HISTORY — DX: Cough, unspecified: R05.9

## 2021-10-21 HISTORY — DX: Hypo-osmolality and hyponatremia: E87.1

## 2021-10-21 HISTORY — DX: Shortness of breath: R06.02

## 2021-10-21 LAB — URINALYSIS, ROUTINE W REFLEX MICROSCOPIC
Bacteria, UA: NONE SEEN
Bilirubin Urine: NEGATIVE
Glucose, UA: NEGATIVE mg/dL
Ketones, ur: NEGATIVE mg/dL
Leukocytes,Ua: NEGATIVE
Nitrite: NEGATIVE
Protein, ur: NEGATIVE mg/dL
Specific Gravity, Urine: 1.006 (ref 1.005–1.030)
pH: 6 (ref 5.0–8.0)

## 2021-10-21 LAB — CBC
HCT: 33.5 % — ABNORMAL LOW (ref 36.0–46.0)
Hemoglobin: 11.4 g/dL — ABNORMAL LOW (ref 12.0–15.0)
MCH: 29.9 pg (ref 26.0–34.0)
MCHC: 34 g/dL (ref 30.0–36.0)
MCV: 87.9 fL (ref 80.0–100.0)
Platelets: 451 10*3/uL — ABNORMAL HIGH (ref 150–400)
RBC: 3.81 MIL/uL — ABNORMAL LOW (ref 3.87–5.11)
RDW: 12.1 % (ref 11.5–15.5)
WBC: 12.4 10*3/uL — ABNORMAL HIGH (ref 4.0–10.5)
nRBC: 0 % (ref 0.0–0.2)

## 2021-10-21 LAB — BASIC METABOLIC PANEL
Anion gap: 15 (ref 5–15)
BUN: 30 mg/dL — ABNORMAL HIGH (ref 8–23)
CO2: 28 mmol/L (ref 22–32)
Calcium: 9.1 mg/dL (ref 8.9–10.3)
Chloride: 80 mmol/L — ABNORMAL LOW (ref 98–111)
Creatinine, Ser: 1.64 mg/dL — ABNORMAL HIGH (ref 0.44–1.00)
GFR, Estimated: 31 mL/min — ABNORMAL LOW (ref >60)
Glucose, Bld: 206 mg/dL — ABNORMAL HIGH (ref 70–99)
Potassium: 3.8 mmol/L (ref 3.5–5.1)
Sodium: 123 mmol/L — ABNORMAL LOW (ref 135–145)

## 2021-10-21 LAB — BRAIN NATRIURETIC PEPTIDE: B Natriuretic Peptide: 258.8 pg/mL — ABNORMAL HIGH (ref 0.0–100.0)

## 2021-10-21 LAB — TROPONIN I (HIGH SENSITIVITY)
Troponin I (High Sensitivity): 42 ng/L — ABNORMAL HIGH (ref <18)
Troponin I (High Sensitivity): 45 ng/L — ABNORMAL HIGH (ref <18)

## 2021-10-21 LAB — SODIUM, URINE, RANDOM: Sodium, Ur: 48 mmol/L

## 2021-10-21 LAB — PROCALCITONIN: Procalcitonin: <0.1 ng/mL

## 2021-10-21 LAB — OSMOLALITY, URINE: Osmolality, Ur: 207 mOsm/kg — ABNORMAL LOW (ref 300–900)

## 2021-10-21 MED ORDER — NITROGLYCERIN 0.4 MG SL SUBL: 0.4000 mg | SUBLINGUAL_TABLET | SUBLINGUAL | Status: DC | PRN

## 2021-10-21 MED ORDER — SODIUM CHLORIDE 0.9% FLUSH
3.0000 mL | Freq: Two times a day (BID) | INTRAVENOUS | Status: DC
  Administered 2021-10-22 – 2021-10-24 (×5): 3 mL via INTRAVENOUS

## 2021-10-21 MED ORDER — ASPIRIN 81 MG PO CHEW
324.0000 mg | CHEWABLE_TABLET | Freq: Once | ORAL | Status: AC
  Administered 2021-10-21: 324 mg via ORAL
  Filled 2021-10-21: qty 4

## 2021-10-21 MED ORDER — SODIUM CHLORIDE 0.9 % IV BOLUS
1000.0000 mL | Freq: Once | INTRAVENOUS | Status: AC
  Administered 2021-10-21: 1000 mL via INTRAVENOUS

## 2021-10-21 MED ORDER — ACETAMINOPHEN 325 MG PO TABS
650.0000 mg | ORAL_TABLET | Freq: Once | ORAL | Status: AC
  Administered 2021-10-21: 650 mg via ORAL
  Filled 2021-10-21: qty 2

## 2021-10-21 MED ORDER — ACETAMINOPHEN 325 MG PO TABS
650.0000 mg | ORAL_TABLET | ORAL | Status: DC | PRN
  Administered 2021-10-22: 650 mg via ORAL
  Filled 2021-10-21: qty 2

## 2021-10-21 MED ORDER — ROSUVASTATIN CALCIUM 10 MG PO TABS
20.0000 mg | ORAL_TABLET | Freq: Every day | ORAL | Status: DC
  Administered 2021-10-22 – 2021-10-23 (×2): 20 mg via ORAL
  Filled 2021-10-21 (×2): qty 2

## 2021-10-21 MED ORDER — OXYCODONE HCL ER 10 MG PO T12A: 10.0000 mg | EXTENDED_RELEASE_TABLET | ORAL | Status: DC

## 2021-10-21 MED ORDER — SODIUM CHLORIDE 0.9% FLUSH
3.0000 mL | INTRAVENOUS | Status: DC | PRN
Start: 2021-10-21 — End: 2021-10-24

## 2021-10-21 MED ORDER — PANTOPRAZOLE SODIUM 40 MG PO TBEC
40.0000 mg | DELAYED_RELEASE_TABLET | Freq: Every day | ORAL | Status: DC
  Administered 2021-10-22 – 2021-10-24 (×3): 40 mg via ORAL
  Filled 2021-10-21 (×3): qty 1

## 2021-10-21 MED ORDER — ASPIRIN 81 MG PO TBEC
81.0000 mg | DELAYED_RELEASE_TABLET | Freq: Every day | ORAL | Status: DC
  Administered 2021-10-22 – 2021-10-23 (×2): 81 mg via ORAL
  Filled 2021-10-21 (×2): qty 1

## 2021-10-21 MED ORDER — CLOPIDOGREL BISULFATE 75 MG PO TABS: 75.0000 mg | ORAL_TABLET | Freq: Once | ORAL | Status: DC

## 2021-10-21 MED ORDER — MELATONIN 5 MG PO TABS
5.0000 mg | ORAL_TABLET | Freq: Once | ORAL | Status: AC
  Administered 2021-10-22: 5 mg via ORAL
  Filled 2021-10-21: qty 1

## 2021-10-21 MED ORDER — SODIUM CHLORIDE 0.9 % IV SOLN: 250.0000 mL | INTRAVENOUS | Status: DC | PRN

## 2021-10-21 MED ORDER — ONDANSETRON HCL 4 MG/2ML IJ SOLN: 4.0000 mg | Freq: Four times a day (QID) | INTRAMUSCULAR | Status: DC | PRN

## 2021-10-21 NOTE — H&P (Signed)
  Patient referred by DDS for extraction  all remaining teeth  CC: Some pain off and on.  Past Medical History:  High Blood Pressure, Hay Fever or Sinus Problems, gallbladder removed, Diabetes, Kidney Trouble, Breast Cancer, Morbid Obesity    Medications: Plavix, Oxycontin, Gabapentin, D3, Lexapro, Lasix, Losartan, Dexilant, Valium, HCTZ, Amlodipine, Aspirin, Pepcid, Januvia, NTG, Potassium, Crestor    Allergies:     Iodine, Sulfa    Surgeries:   Stent placed 06/29/2021, Hysterectomy, Mastectomy, gallbladder removal     Social History       Smoking:            Alcohol: Drug use:                             Exam: BMI 40. Decay, perio disease all remaining teeth # 2, 3, 6, 7, 8, 9, 10, 11, 20, 21, 22, 23, 24, 25, 26, 27, 28, 29. Bilateral mandibular lingual tori.  No purulence, edema, fluctuance, trismus. Oral cancer screening negative. Pharynx clear. No lymphadenopathy.  Panorex: Decay, perio disease all remaining teeth # 2, 3, 6, 7, 8, 9, 10, 11, 20, 21, 22, 23, 24, 25, 26, 27, 28, 29.  Assessment: ASA 3. Non-restorable teeth # 2, 3, 6, 7, 8, 9, 10, 11, 20, 21, 22, 23, 24, 25, 26, 27, 28, 29. Bilateral mandibular lingual tori.               Plan: 1. MD Clearance obtained.  2. Extraction Teeth # 2, 3, 6, 7, 8, 9, 10, 11, 20, 21, 22, 23, 24, 25, 26, 27, 28, 29. Alveoloplasty. Removal lingual tori.  Hospital Day surgery.                 Rx: n               Risks and complications explained. Questions answered.   Hayley Knight, DMD

## 2021-10-21 NOTE — Assessment & Plan Note (Signed)
Cardiology consult for possible angina presentation.

## 2021-10-21 NOTE — Assessment & Plan Note (Signed)
Continue diazepam PRN along with lexapro.

## 2021-10-21 NOTE — Hospital Course (Addendum)
Admission request: H/o OA/ HTN/ DM II. Cough for several days.  Productive cough, sob. No eating much. Labs sodium 123.  Mild AKI. Mild Leucocytosis. Chest xray shows pulmonary edema. Ct chest shows bronchitis.  Mucus plugging. BNP mildly elevated., cardiac cath in march 2023 and ef was normal. TNI 42 no chest pain. Procalcitonin pending.  Pt has got INF and aspirin. No on oxygen.  Per Pt: Got up Sunday am could not breath and was wheezing and coughing.  Progressively gotten worse.  Suspect aspiration pna. No coughing with eating or difficulty swallowing but sometime she does feel that ways with certain foods.  Pt talks about uti. Pt lives alone and needs help at home. Currently she is breathing well and is not dyspneic but is wheezing.

## 2021-10-21 NOTE — Assessment & Plan Note (Addendum)
Pt is taking blood thinner- plavix. Type and screen/  Pt denies any black stools or blood in th e stools. Pt does not know she is anemic.   IV PPI therapy.

## 2021-10-21 NOTE — Assessment & Plan Note (Signed)
>>  ASSESSMENT AND PLAN FOR ACUTE RENAL FAILURE SUPERIMPOSED ON STAGE 3 CHRONIC KIDNEY DISEASE ([facility]CC) WRITTEN ON 10/21/2021 10:56 PM BY PATEL, EKTA V, MD  Will hold ACEI and ARB and nephrotoxic meds.  renalyl dsoe all meds.  no iv contrast unless emergency.

## 2021-10-21 NOTE — Assessment & Plan Note (Signed)
Prn antitussive. Azithromycin for next 3 days. Ct chest if persistent.

## 2021-10-21 NOTE — Assessment & Plan Note (Signed)
Last time she fell was this year few months ago.  She slid into the wall

## 2021-10-21 NOTE — ED Triage Notes (Signed)
Pt brought in via ems from home with a cough and sob.  Sx for 4 days.  No chest pain.  No fever.  Pt also reports kidney infection.  Pt alert

## 2021-10-21 NOTE — Assessment & Plan Note (Signed)
Will hold ACEI and ARB and nephrotoxic meds.  renalyl dsoe all meds.  no iv contrast unless emergency.

## 2021-10-21 NOTE — Assessment & Plan Note (Addendum)
Suspect anginal equivalent in her case. We will defer to cardiology for further management.  Pt sees dr Juliann Pares.   Pt did get a RCA stent at [facility] . We will also start unasyn for aspiration pna.

## 2021-10-21 NOTE — H&P (Signed)
History and Physical    Patient: Hayley Knight WLN:[id number] DOB: 1936-02-25 DOA: 10/21/2021 DOS: the patient was seen and examined on 10/21/2021 PCP: Danelle Berry, NP  Patient coming from: Home  Chief Complaint:  Chief Complaint  Patient presents with   Cough   HPI: Hayley Knight is a 86 y.o. female with medical history significant of HTN, DM II coming for cough and sob. No chest pain or palpitation. Decreased appetite due to dental issues I suspect.  Patient is having bilateral rib pain with coughing but no chest pain no chest pressure no left arm pain no neck pain. Review of Systems  Respiratory:  Positive for cough and shortness of breath.   All other systems reviewed and are negative.  Past Medical History:  Diagnosis Date   Arthritis    Diabetes mellitus without complication ([place])    Hypertension    Past Surgical History:  Procedure Laterality Date   ABDOMINAL HYSTERECTOMY     APPENDECTOMY     CHOLECYSTECTOMY     MASTECTOMY Right    REPLACEMENT TOTAL KNEE BILATERAL     RIGHT/LEFT HEART CATH AND CORONARY ANGIOGRAPHY Bilateral 07/09/2021   Procedure: RIGHT/LEFT HEART CATH AND CORONARY ANGIOGRAPHY;  Surgeon: Yolonda Kida, MD;  Location: [facility];  Service: Cardiovascular;  Laterality: Bilateral;   Social History:  reports that she has never smoked. She has never used smokeless tobacco. She reports that she does not drink alcohol and does not use drugs.  Allergies  Allergen Reactions   Ivp Dye [Iodinated Contrast Media] Swelling   Sulfa Antibiotics Swelling    No family history on file.  Prior to Admission medications   Medication Sig Start Date End Date Taking? Authorizing Provider  amLODipine (NORVASC) 2.5 MG tablet Take 2.5 mg by mouth daily.    [provider]  aspirin 81 MG EC tablet Take 81 mg by mouth at bedtime. 03/08/15   [provider]  clopidogrel (PLAVIX) 75 MG tablet Take by mouth. 07/14/21 07/14/22   [provider]  dexlansoprazole (DEXILANT) 60 MG capsule Take 60 mg by mouth daily.    [provider]  diazepam (VALIUM) 5 MG tablet Take 5 mg by mouth every 12 (twelve) hours as needed for anxiety.    [provider]  escitalopram (LEXAPRO) 20 MG tablet Take 20 mg by mouth daily.    [provider]  famotidine (PEPCID) 20 MG tablet Take 20 mg by mouth 2 (two) times daily. 05/07/21   [provider]  fenofibrate (TRICOR) 145 MG tablet Take 145 mg by mouth at bedtime.    [provider]  fluticasone (CUTIVATE) 0.05 % cream Apply 1 application. topically daily as needed for rash. 05/22/21   [provider]  furosemide (LASIX) 40 MG tablet Take 40 mg by mouth daily.    [provider]  gabapentin (NEURONTIN) 300 MG capsule Take 300 mg by mouth at bedtime.    [provider]  hydrochlorothiazide (HYDRODIURIL) 12.5 MG tablet Take 12.5 mg by mouth every morning. 07/22/21   [provider]  losartan (COZAAR) 100 MG tablet Take 100 mg by mouth daily.    [provider]  melatonin 3 MG TABS tablet Take by mouth. 07/13/21   [provider]  mirabegron ER (MYRBETRIQ) 25 MG TB24 tablet Take 1 tablet (25 mg total) by mouth daily. 08/25/21   Bjorn Loser, MD  Multiple Vitamin (MULTIVITAMIN) capsule Take 2 capsules by mouth daily.  [provider]  Multiple Vitamins-Minerals (ONE A DAY IMMUNITY DEFENSE PO) Take 2 tablets by mouth daily.    [provider]  naproxen sodium (ALEVE) 220 MG tablet Take 440 mg by mouth daily as needed (pain).    [provider]  nitroGLYCERIN (NITROGLYN) 2 % ointment Apply 0.5 inches topically every 6 (six) hours. 07/11/21   Callwood, Dwayne D, MD  nitroGLYCERIN (NITROSTAT) 0.4 MG SL tablet Place under the tongue. 07/13/21 07/13/22  [provider]  omega-3 acid ethyl esters (LOVAZA) 1 G capsule Take 2 g by mouth 2 (two) times daily.     [provider]  ondansetron (ZOFRAN) 4 MG tablet Take 4 mg by mouth every 6 (six) hours as needed. 07/14/21   [provider]  ondansetron (ZOFRAN) 4 MG/2ML SOLN injection Inject 2 mLs (4 mg total) into the vein every 6 (six) hours as needed for nausea. 07/11/21   Callwood, Karma Greaser D, MD  oxyCODONE (OXYCONTIN) 10 mg 12 hr tablet Take 10-20 mg by mouth See admin instructions. Take 20 mg in the morning, 10 mg in the afternoon, and 10 mg at bedtime    [provider]  potassium chloride (K-DUR) 10 MEQ tablet Take 10 mEq by mouth daily. 11/25/17   [provider]  predniSONE (DELTASONE) 5 MG tablet Take 5 mg by mouth in the morning and at bedtime.    [provider]  rosuvastatin (CRESTOR) 20 MG tablet Take 1 tablet by mouth at bedtime. 07/13/21 07/13/22  [provider]  sitaGLIPtin (JANUVIA) 100 MG tablet Take 100 mg by mouth daily.    [provider]    Physical Exam: Vitals:   10/21/21 1926 10/21/21 1929 10/21/21 2230  BP:  (!) 142/58 125/61  Pulse:  71 64  Resp:  20 16  Temp:  98.6 F (37 C)   TempSrc:  Oral   SpO2:  95% 97%  Weight: 89 kg    Height: '4\' 11"'$  (1.499 m)    Physical Exam Vitals and nursing note reviewed.  Constitutional:      General: She is not in acute distress.    Appearance: Normal appearance. She is not ill-appearing, toxic-appearing or diaphoretic.     Interventions: Nasal cannula in place.  HENT:     Head: Normocephalic and atraumatic.     Right Ear: Hearing and external ear normal.     Left Ear: Hearing and external ear normal.     Nose: Nose normal. No nasal deformity.     Mouth/Throat:     Lips: Pink.     Mouth: Mucous membranes are moist.     Tongue: No lesions.     Pharynx: Oropharynx is clear.  Eyes:     Extraocular Movements: Extraocular movements intact.     Pupils: Pupils are equal, round, and reactive to light.  Neck:     Vascular: No carotid bruit.  Cardiovascular:     Rate and Rhythm:  Normal rate and regular rhythm.     Pulses: Normal pulses.     Heart sounds: Normal heart sounds.  Pulmonary:     Effort: Pulmonary effort is normal.     Breath sounds: Wheezing present.  Abdominal:     General: Bowel sounds are normal. There is no distension.     Palpations: Abdomen is soft. There is no mass.     Tenderness: There is no abdominal tenderness. There is no guarding.     Hernia: No hernia is present.  Musculoskeletal:  Right lower leg: No edema.     Left lower leg: No edema.  Skin:    General: Skin is warm.  Neurological:     General: No focal deficit present.     Mental Status: She is alert and oriented to person, place, and time.     Cranial Nerves: Cranial nerves 2-12 are intact.     Motor: Motor function is intact.  Psychiatric:        Attention and Perception: Attention normal.        Mood and Affect: Mood normal.        Speech: Speech normal.        Behavior: Behavior normal. Behavior is cooperative.        Cognition and Memory: Cognition normal.   >>Rigth/ Left heart cath 07/09/2021:   Prox RCA-2 lesion is 100% stenosed.   Prox RCA-1 lesion is 100% stenosed.   The left ventricular systolic function is normal.   LV end diastolic pressure is normal.   The left ventricular ejection fraction is 55-65% by visual estimate.   Hemodynamic findings consistent with mild pulmonary hypertension.   Left main free of disease   LAD large relatively free of disease   Circumflex was large relatively free of disease   Intervention was deferred   Patient to be referred to tertiary care center for CTO intervention Conclusion Right heart cath right femoral vein Mean wedge of 11 mean PA 28.  Left heart cath Normal left ventricular function of 55% Left main normal minor irregularities LAD normal large Circumflex normal large RCA 100% occluded proximally CTO Extensive collaterals left to right Minx was deployed to the femoral artery Patient tolerated procedure  well No complications Intervention was deferred patient to be referred to a tertiary care center for CTO intervention as an outpatient.  >> EKG today:     Data Reviewed: Results for orders placed or performed during the hospital encounter of 10/21/21 (from the past 24 hour(s))  Basic metabolic panel     Status: Abnormal   Collection Time: 10/21/21  7:28 PM  Result Value Ref Range   Sodium 123 (L) 135 - 145 mmol/L   Potassium 3.8 3.5 - 5.1 mmol/L   Chloride 80 (L) 98 - 111 mmol/L   CO2 28 22 - 32 mmol/L   Glucose, Bld 206 (H) 70 - 99 mg/dL   BUN 30 (H) 8 - 23 mg/dL   Creatinine, Ser 1.64 (H) 0.44 - 1.00 mg/dL   Calcium 9.1 8.9 - 10.3 mg/dL   GFR, Estimated 31 (L) >60 mL/min   Anion gap 15 5 - 15  CBC     Status: Abnormal   Collection Time: 10/21/21  7:28 PM  Result Value Ref Range   WBC 12.4 (H) 4.0 - 10.5 K/uL   RBC 3.81 (L) 3.87 - 5.11 MIL/uL   Hemoglobin 11.4 (L) 12.0 - 15.0 g/dL   HCT 33.5 (L) 36.0 - 46.0 %   MCV 87.9 80.0 - 100.0 fL   MCH 29.9 26.0 - 34.0 pg   MCHC 34.0 30.0 - 36.0 g/dL   RDW 12.1 11.5 - 15.5 %   Platelets 451 (H) 150 - 400 K/uL   nRBC 0.0 0.0 - 0.2 %  Troponin I (High Sensitivity)     Status: Abnormal   Collection Time: 10/21/21  7:28 PM  Result Value Ref Range   Troponin I (High Sensitivity) 42 (H) <18 ng/L  Brain natriuretic peptide     Status: Abnormal  Collection Time: 10/21/21  7:28 PM  Result Value Ref Range   B Natriuretic Peptide 258.8 (H) 0.0 - 100.0 pg/mL  Procalcitonin - Baseline     Status: None   Collection Time: 10/21/21  7:28 PM  Result Value Ref Range   Procalcitonin <0.10 ng/mL  Troponin I (High Sensitivity)     Status: Abnormal   Collection Time: 10/21/21  9:52 PM  Result Value Ref Range   Troponin I (High Sensitivity) 45 (H) <18 ng/L  Urinalysis, Routine w reflex microscopic     Status: Abnormal   Collection Time: 10/21/21  9:52 PM  Result Value Ref Range   Color, Urine STRAW (A) YELLOW   APPearance CLEAR (A) CLEAR    Specific Gravity, Urine 1.006 1.005 - 1.030   pH 6.0 5.0 - 8.0   Glucose, UA NEGATIVE NEGATIVE mg/dL   Hgb urine dipstick MODERATE (A) NEGATIVE   Bilirubin Urine NEGATIVE NEGATIVE   Ketones, ur NEGATIVE NEGATIVE mg/dL   Protein, ur NEGATIVE NEGATIVE mg/dL   Nitrite NEGATIVE NEGATIVE   Leukocytes,Ua NEGATIVE NEGATIVE   RBC / HPF 0-5 0 - 5 RBC/hpf   WBC, UA 0-5 0 - 5 WBC/hpf   Bacteria, UA NONE SEEN NONE SEEN   Squamous Epithelial / LPF 0-5 0 - 5  Sodium, urine, random     Status: None   Collection Time: 10/21/21  9:52 PM  Result Value Ref Range   Sodium, Ur 48 mmol/L   Assessment and Plan: * Cough Prn antitussive. Azithromycin for next 3 days. Ct chest if persistent.   Dyspnea Suspect anginal equivalent in her case. We will defer to cardiology for further management.  Pt sees dr Clayborn Bigness.   Pt did get a RCA stent at [facility] . We will also start unasyn for aspiration pna.    Anxiety Continue diazepam PRN along with lexapro.   Diabetes mellitus type 2, uncomplicated (HCC) Glycemic protocol.   Hold [place].   CAD (coronary artery disease) Cardiology consult for possible angina presentation.   Acute renal failure superimposed on stage 3 chronic kidney disease (HCC) Will hold ACEI and ARB and nephrotoxic meds.  renalyl dsoe all meds.  no iv contrast unless emergency.    Hyponatremia Chart  mentions SIADH.  We will follow. Cautious hydration , strict and O,   Risk for falls Last time she fell was this year few months ago.  She slid into the wall  Anemia Pt is taking blood thinner- plavix. Type and screen/  Pt denies any black stools or blood in th e stools. Pt does not know she is anemic.   IV PPI therapy.      Advance Care Planning:    Code Status: Full Code   Consults:  None   Family Communication:  Raynald Blend (Relative)  [phone] (Home Phone)  Severity of Illness: The appropriate patient status for this patient is OBSERVATION.  Observation status is judged to be reasonable and necessary in order to provide the required intensity of service to ensure the patient's safety. The patient's presenting symptoms, physical exam findings, and initial radiographic and laboratory data in the context of their medical condition is felt to place them at decreased risk for further clinical deterioration. Furthermore, it is anticipated that the patient will be medically stable for discharge from the hospital within 2 midnights of admission.   Author: Para Skeans, MD 10/21/2021 11:53 PM  For on call review [email or web address].

## 2021-10-21 NOTE — ED Provider Notes (Signed)
[facility] Provider Note    Event Date/Time   First MD Initiated Contact with Patient 10/21/21 2042     (approximate)   History   Cough   HP  Hayley Knight is a 86 y.o. female past medical history of osteoarthritis hypertension diabetes who presents with cough.  Over the weekend patient has developed cough that is productive of what she calls gray sputum.  She feels slightly dyspneic.  She has had decreased appetite not really eating much and is feeling fatigued.  She is having pain in her ribs with coughing but otherwise no chest pain.  Denies fevers chills nausea vomiting or diarrhea.  Denies history of CHF no peripheral edema.  Patient tells me she has bladder prolapse and was told that she was growing bacteria in her urine last month so was given antibiotic for this.  She denies any dysuria frequency urgency currently.    Past Medical History:  Diagnosis Date   Arthritis    Diabetes mellitus without complication (HCC)    Hypertension     Patient Active Problem List   Diagnosis Date Noted   Dyspnea 07/09/2021   Anxiety 05/09/2018   Diabetes mellitus type 2, uncomplicated (HCC) 05/09/2018   GERD (gastroesophageal reflux disease) 05/09/2018   SI joint arthritis 11/08/2017   Chronic bilateral low back pain with bilateral sciatica 11/08/2017   Lumbar facet arthropathy 11/08/2017   Spondylosis without myelopathy or radiculopathy, lumbar region 11/08/2017   Lumbar degenerative disc disease 11/08/2017   Class 2 severe obesity due to excess calories with serious comorbidity and body mass index (BMI) of 37.0 to 37.9 in adult (HCC) 11/08/2017   Jaw pain 02/15/2013   Shoulder bursitis 02/15/2013   Essential hypertension 09/30/2012   Left shoulder pain 09/30/2012   Narcotic dependence, episodic use (HCC) 09/30/2012   Fibromyalgia 08/31/2012   OA (osteoarthritis) 08/31/2012     Physical Exam  Triage Vital Signs: ED Triage Vitals  Enc Vitals Group      BP 10/21/21 1929 (!) 142/58     Pulse Rate 10/21/21 1929 71     Resp 10/21/21 1929 20     Temp 10/21/21 1929 98.6 F (37 C)     Temp Source 10/21/21 1929 Oral     SpO2 10/21/21 1929 95 %     Weight 10/21/21 1926 196 lb 3.4 oz (89 kg)     Height 10/21/21 1926 4\' 11"  (1.499 m)     Head Circumference --      Peak Flow --      Pain Score 10/21/21 1926 0     Pain Loc --      Pain Edu? --      Excl. in GC? --     Most recent vital signs: Vitals:   10/21/21 1929  BP: (!) 142/58  Pulse: 71  Resp: 20  Temp: 98.6 F (37 C)  SpO2: 95%     General: Awake, no distress.  CV:  Good peripheral perfusion.  No peripheral edema Resp:  Normal effort.  Lungs are clear Abd:  No distention.  Neuro:             Awake, Alert, Oriented x 3  Other:     ED Results / Procedures / Treatments  Labs (all labs ordered are listed, but only abnormal results are displayed) Labs Reviewed  BASIC METABOLIC PANEL - Abnormal; Notable for the following components:      Result Value   Sodium 123 (*)  Chloride 80 (*)    Glucose, Bld 206 (*)    BUN 30 (*)    Creatinine, Ser 1.64 (*)    GFR, Estimated 31 (*)    All other components within normal limits  CBC - Abnormal; Notable for the following components:   WBC 12.4 (*)    RBC 3.81 (*)    Hemoglobin 11.4 (*)    HCT 33.5 (*)    Platelets 451 (*)    All other components within normal limits  BRAIN NATRIURETIC PEPTIDE - Abnormal; Notable for the following components:   B Natriuretic Peptide 258.8 (*)    All other components within normal limits  URINALYSIS, ROUTINE W REFLEX MICROSCOPIC - Abnormal; Notable for the following components:   Color, Urine STRAW (*)    APPearance CLEAR (*)    Hgb urine dipstick MODERATE (*)    All other components within normal limits  TROPONIN I (HIGH SENSITIVITY) - Abnormal; Notable for the following components:   Troponin I (High Sensitivity) 42 (*)    All other components within normal limits  TROPONIN I  (HIGH SENSITIVITY) - Abnormal; Notable for the following components:   Troponin I (High Sensitivity) 45 (*)    All other components within normal limits  PROCALCITONIN  SODIUM, URINE, RANDOM  OSMOLALITY, URINE     EKG  EKG reviewed and interpreted by myself shows right bundle branch block normal sinus rhythm no acute ischemic changes   RADIOLOGY Chest x-ray interpreted by myself shows increased interstitial markings   PROCEDURES:  Critical Care performed: No  .1-3 Lead EKG Interpretation  Performed by: Georga Hacking, MD Authorized by: Georga Hacking, MD     Interpretation: normal     ECG rate assessment: normal     Rhythm: sinus rhythm     Ectopy: none     Conduction: normal     The patient is on the cardiac monitor to evaluate for evidence of arrhythmia and/or significant heart rate changes.   MEDICATIONS ORDERED IN ED: Medications  sodium chloride 0.9 % bolus 1,000 mL (1,000 mLs Intravenous New Bag/Given 10/21/21 2158)  aspirin chewable tablet 324 mg (324 mg Oral Given 10/21/21 2206)     IMPRESSION / MDM / ASSESSMENT AND PLAN / ED COURSE  I reviewed the triage vital signs and the nursing notes.                              Patient's presentation is most consistent with acute presentation with potential threat to life or bodily function.  Differential diagnosis includes, but is not limited to, pneumonia, pulmonary edema, bronchitis, SIADH, hypovolemic hyponatremia, less likely CHF  The patient is a 86 year old female who is relatively healthy who presents today with cough decreased appetite and fatigue.  Her vitals are within normal limits she overall looks well.  Abdomen is soft and nontender lungs are clear no signs of peripheral edema.  Her labs are notable for hyponatremia with a sodium of 123 low chloride she has a mild AKI creatinine 1.6 from baseline of 0.97.  Troponin mildly elevated at 42 has not been elevated the past but she is not having any  chest pain.  EKG shows a right bundle branch block and this is stable.  She has a mild leukocytosis to 12.4.    BNP is mildly elevated.  I did obtain a CT of the chest without contrast to further identify pneumonia and looks like there  is mucus plugging, no infiltrate.  Ultimately I suspect that she has bronchitis.  We will give her a liter of fluid given likely hypovolemic hyponatremic.  She was given aspirin although she is not having an active chest pain.  Discussed with the hospitalist for admission.     FINAL CLINICAL IMPRESSION(S) / ED DIAGNOSES   Final diagnoses:  Hyponatremia     Rx / DC Orders   ED Discharge Orders     None        Note:  This document was prepared using Dragon voice recognition software and may include unintentional dictation errors.   Georga Hacking, MD 10/21/21 2234

## 2021-10-21 NOTE — Assessment & Plan Note (Signed)
Glycemic protocol.   Hold [place].

## 2021-10-21 NOTE — Assessment & Plan Note (Signed)
>>  ASSESSMENT AND PLAN FOR DIABETES MELLITUS TYPE 2, UNCOMPLICATED (HCC) WRITTEN ON 10/21/2021 10:51 PM BY PATEL, EKTA V, MD  Glycemic protocol.   Hold [place].

## 2021-10-21 NOTE — Assessment & Plan Note (Addendum)
Chart  mentions SIADH.  We will follow. Cautious hydration , strict and O,

## 2021-10-22 ENCOUNTER — Observation Stay
Admit: 2021-10-22 | Discharge: 2021-10-22 | Disposition: A | Discharge status: Home / Self Care | Payer: Medicare Other | Attending: Internal Medicine | Admitting: Internal Medicine

## 2021-10-22 DIAGNOSIS — Z7982 Long term (current) use of aspirin: Secondary | ICD-10-CM | POA: Diagnosis not present

## 2021-10-22 DIAGNOSIS — D75839 Thrombocytosis, unspecified: Secondary | ICD-10-CM | POA: Diagnosis not present

## 2021-10-22 DIAGNOSIS — R0602 Shortness of breath: Secondary | ICD-10-CM | POA: Diagnosis not present

## 2021-10-22 DIAGNOSIS — R059 Cough, unspecified: Secondary | ICD-10-CM | POA: Diagnosis not present

## 2021-10-22 DIAGNOSIS — Z79899 Other long term (current) drug therapy: Secondary | ICD-10-CM | POA: Diagnosis not present

## 2021-10-22 DIAGNOSIS — Z853 Personal history of malignant neoplasm of breast: Secondary | ICD-10-CM | POA: Diagnosis not present

## 2021-10-22 DIAGNOSIS — J810 Acute pulmonary edema: Secondary | ICD-10-CM

## 2021-10-22 DIAGNOSIS — Z7984 Long term (current) use of oral hypoglycemic drugs: Secondary | ICD-10-CM | POA: Diagnosis not present

## 2021-10-22 DIAGNOSIS — N179 Acute kidney failure, unspecified: Secondary | ICD-10-CM | POA: Diagnosis not present

## 2021-10-22 DIAGNOSIS — I129 Hypertensive chronic kidney disease with stage 1 through stage 4 chronic kidney disease, or unspecified chronic kidney disease: Secondary | ICD-10-CM | POA: Diagnosis not present

## 2021-10-22 DIAGNOSIS — R0600 Dyspnea, unspecified: Secondary | ICD-10-CM | POA: Diagnosis not present

## 2021-10-22 DIAGNOSIS — E871 Hypo-osmolality and hyponatremia: Secondary | ICD-10-CM

## 2021-10-22 DIAGNOSIS — N1832 Chronic kidney disease, stage 3b: Secondary | ICD-10-CM

## 2021-10-22 DIAGNOSIS — N183 Chronic kidney disease, stage 3 unspecified: Secondary | ICD-10-CM | POA: Diagnosis not present

## 2021-10-22 DIAGNOSIS — R160 Hepatomegaly, not elsewhere classified: Secondary | ICD-10-CM | POA: Diagnosis not present

## 2021-10-22 DIAGNOSIS — I2510 Atherosclerotic heart disease of native coronary artery without angina pectoris: Secondary | ICD-10-CM | POA: Diagnosis not present

## 2021-10-22 DIAGNOSIS — E876 Hypokalemia: Secondary | ICD-10-CM | POA: Diagnosis not present

## 2021-10-22 DIAGNOSIS — D649 Anemia, unspecified: Secondary | ICD-10-CM | POA: Diagnosis not present

## 2021-10-22 DIAGNOSIS — E1122 Type 2 diabetes mellitus with diabetic chronic kidney disease: Secondary | ICD-10-CM | POA: Diagnosis not present

## 2021-10-22 DIAGNOSIS — I10 Essential (primary) hypertension: Secondary | ICD-10-CM | POA: Diagnosis not present

## 2021-10-22 DIAGNOSIS — I5030 Unspecified diastolic (congestive) heart failure: Secondary | ICD-10-CM | POA: Diagnosis not present

## 2021-10-22 DIAGNOSIS — Z7902 Long term (current) use of antithrombotics/antiplatelets: Secondary | ICD-10-CM | POA: Diagnosis not present

## 2021-10-22 LAB — CBC
HCT: 31.2 % — ABNORMAL LOW (ref 36.0–46.0)
Hemoglobin: 10.7 g/dL — ABNORMAL LOW (ref 12.0–15.0)
MCH: 30.1 pg (ref 26.0–34.0)
MCHC: 34.3 g/dL (ref 30.0–36.0)
MCV: 87.9 fL (ref 80.0–100.0)
Platelets: 447 10*3/uL — ABNORMAL HIGH (ref 150–400)
RBC: 3.55 MIL/uL — ABNORMAL LOW (ref 3.87–5.11)
RDW: 12.2 % (ref 11.5–15.5)
WBC: 10.7 10*3/uL — ABNORMAL HIGH (ref 4.0–10.5)
nRBC: 0 % (ref 0.0–0.2)

## 2021-10-22 LAB — ECHOCARDIOGRAM COMPLETE
AR max vel: 2.23 cm2
AV Area VTI: 2.2 cm2
AV Area mean vel: 2.06 cm2
AV Mean grad: 4 mmHg
AV Peak grad: 8.1 mmHg
Ao pk vel: 1.42 m/s
Area-P 1/2: 2.97 cm2
Height: 59 in
Weight: 3139.35 oz

## 2021-10-22 LAB — BASIC METABOLIC PANEL
Anion gap: 12 (ref 5–15)
BUN: 24 mg/dL — ABNORMAL HIGH (ref 8–23)
CO2: 30 mmol/L (ref 22–32)
Calcium: 8.9 mg/dL (ref 8.9–10.3)
Chloride: 87 mmol/L — ABNORMAL LOW (ref 98–111)
Creatinine, Ser: 1.31 mg/dL — ABNORMAL HIGH (ref 0.44–1.00)
GFR, Estimated: 40 mL/min — ABNORMAL LOW (ref >60)
Glucose, Bld: 102 mg/dL — ABNORMAL HIGH (ref 70–99)
Potassium: 3 mmol/L — ABNORMAL LOW (ref 3.5–5.1)
Sodium: 129 mmol/L — ABNORMAL LOW (ref 135–145)

## 2021-10-22 LAB — PROTIME-INR
INR: 1.1 (ref 0.8–1.2)
Prothrombin Time: 14.5 seconds (ref 11.4–15.2)

## 2021-10-22 LAB — T4, FREE: Free T4: 1.15 ng/dL — ABNORMAL HIGH (ref 0.61–1.12)

## 2021-10-22 LAB — LIPID PANEL
Cholesterol: 87 mg/dL (ref 0–200)
HDL: 40 mg/dL — ABNORMAL LOW (ref >40)
LDL Cholesterol: 17 mg/dL (ref 0–99)
Total CHOL/HDL Ratio: 2.2 RATIO
Triglycerides: 150 mg/dL — ABNORMAL HIGH (ref <150)
VLDL: 30 mg/dL (ref 0–40)

## 2021-10-22 LAB — TROPONIN I (HIGH SENSITIVITY)
Troponin I (High Sensitivity): 37 ng/L — ABNORMAL HIGH (ref <18)
Troponin I (High Sensitivity): 30 ng/L — ABNORMAL HIGH (ref <18)

## 2021-10-22 LAB — GLUCOSE, CAPILLARY
Glucose-Capillary: 143 mg/dL — ABNORMAL HIGH (ref 70–99)
Glucose-Capillary: 173 mg/dL — ABNORMAL HIGH (ref 70–99)

## 2021-10-22 LAB — TSH: TSH: 0.297 u[IU]/mL — ABNORMAL LOW (ref 0.350–4.500)

## 2021-10-22 MED ORDER — SODIUM CHLORIDE 0.9 % IV SOLN
3.0000 g | Freq: Four times a day (QID) | INTRAVENOUS | Status: DC
  Administered 2021-10-22 – 2021-10-24 (×8): 3 g via INTRAVENOUS
  Filled 2021-10-22: qty 8
  Filled 2021-10-22: qty 3
  Filled 2021-10-22 (×2): qty 8
  Filled 2021-10-22: qty 3
  Filled 2021-10-22 (×5): qty 8

## 2021-10-22 MED ORDER — OXYCODONE HCL ER 10 MG PO T12A
20.0000 mg | EXTENDED_RELEASE_TABLET | Freq: Every day | ORAL | Status: DC
  Administered 2021-10-22 – 2021-10-24 (×3): 20 mg via ORAL
  Filled 2021-10-22 (×3): qty 2

## 2021-10-22 MED ORDER — DM-GUAIFENESIN ER 30-600 MG PO TB12
1.0000 | ORAL_TABLET | Freq: Two times a day (BID) | ORAL | Status: DC
  Administered 2021-10-22 – 2021-10-24 (×5): 1 via ORAL
  Filled 2021-10-22 (×5): qty 1

## 2021-10-22 MED ORDER — DIAZEPAM 5 MG PO TABS
5.0000 mg | ORAL_TABLET | Freq: Two times a day (BID) | ORAL | Status: DC | PRN
  Administered 2021-10-22: 5 mg via ORAL
  Filled 2021-10-22: qty 1

## 2021-10-22 MED ORDER — INSULIN ASPART 100 UNIT/ML IJ SOLN
0.0000 [IU] | Freq: Three times a day (TID) | INTRAMUSCULAR | Status: DC
  Administered 2021-10-23 (×2): 1 [IU] via SUBCUTANEOUS
  Administered 2021-10-23: 2 [IU] via SUBCUTANEOUS
  Administered 2021-10-24 (×2): 1 [IU] via SUBCUTANEOUS
  Filled 2021-10-22 (×4): qty 1

## 2021-10-22 MED ORDER — OXYCODONE HCL ER 10 MG PO T12A
10.0000 mg | EXTENDED_RELEASE_TABLET | ORAL | Status: DC
  Administered 2021-10-22 – 2021-10-23 (×4): 10 mg via ORAL
  Filled 2021-10-22 (×4): qty 1

## 2021-10-22 MED ORDER — IPRATROPIUM-ALBUTEROL 0.5-2.5 (3) MG/3ML IN SOLN
3.0000 mL | RESPIRATORY_TRACT | Status: DC | PRN
  Administered 2021-10-22: 3 mL via RESPIRATORY_TRACT
  Filled 2021-10-22: qty 3

## 2021-10-22 MED ORDER — METHYLPREDNISOLONE SODIUM SUCC 40 MG IJ SOLR
40.0000 mg | Freq: Two times a day (BID) | INTRAMUSCULAR | Status: DC
  Administered 2021-10-22 – 2021-10-24 (×4): 40 mg via INTRAVENOUS
  Filled 2021-10-22 (×4): qty 1

## 2021-10-22 MED ORDER — GABAPENTIN 300 MG PO CAPS
300.0000 mg | ORAL_CAPSULE | Freq: Once | ORAL | Status: AC
Start: 2021-10-23 — End: 2021-10-22
  Administered 2021-10-22: 300 mg via ORAL
  Filled 2021-10-22: qty 1

## 2021-10-22 MED ORDER — ORAL CARE MOUTH RINSE: 15.0000 mL | OROMUCOSAL | Status: DC | PRN

## 2021-10-22 MED ORDER — ESCITALOPRAM OXALATE 10 MG PO TABS
20.0000 mg | ORAL_TABLET | Freq: Every day | ORAL | Status: DC
  Administered 2021-10-22 – 2021-10-24 (×3): 20 mg via ORAL
  Filled 2021-10-22 (×3): qty 2

## 2021-10-22 MED ORDER — MELATONIN 5 MG PO TABS
5.0000 mg | ORAL_TABLET | Freq: Once | ORAL | Status: AC
Start: 2021-10-23 — End: 2021-10-22
  Administered 2021-10-22: 5 mg via ORAL
  Filled 2021-10-22: qty 1

## 2021-10-22 MED ORDER — IPRATROPIUM-ALBUTEROL 0.5-2.5 (3) MG/3ML IN SOLN: 3.0000 mL | Freq: Four times a day (QID) | RESPIRATORY_TRACT | Status: DC | PRN

## 2021-10-22 MED ORDER — POTASSIUM CHLORIDE CRYS ER 20 MEQ PO TBCR
40.0000 meq | EXTENDED_RELEASE_TABLET | Freq: Once | ORAL | Status: AC
  Administered 2021-10-22: 40 meq via ORAL
  Filled 2021-10-22: qty 2

## 2021-10-22 MED ORDER — FUROSEMIDE 10 MG/ML IJ SOLN
40.0000 mg | Freq: Every day | INTRAMUSCULAR | Status: DC
  Administered 2021-10-22 – 2021-10-24 (×3): 40 mg via INTRAVENOUS
  Filled 2021-10-22 (×3): qty 4

## 2021-10-22 MED ORDER — CLOPIDOGREL BISULFATE 75 MG PO TABS
75.0000 mg | ORAL_TABLET | Freq: Every day | ORAL | Status: DC
  Administered 2021-10-22 – 2021-10-24 (×3): 75 mg via ORAL
  Filled 2021-10-22 (×3): qty 1

## 2021-10-22 MED ORDER — SODIUM CHLORIDE 0.9 % IV SOLN
3.0000 g | Freq: Two times a day (BID) | INTRAVENOUS | Status: DC
  Administered 2021-10-22 (×2): 3 g via INTRAVENOUS
  Filled 2021-10-22 (×2): qty 8

## 2021-10-22 NOTE — ED Notes (Signed)
Pt asleep in bed at this time.

## 2021-10-22 NOTE — ED Notes (Signed)
Unable to get blood from pt, lab called for phlebotomy stick at this time.

## 2021-10-22 NOTE — Progress Notes (Signed)
PROGRESS NOTE    Hayley Knight  GBT:[id number] DOB: [birth year] DOA: 10/21/2021 PCP: Danelle Berry, NP   Assessment & Plan:   Principal Problem:   Cough Active Problems:   Dyspnea   Anxiety   Diabetes mellitus type 2, uncomplicated (HCC)   CAD (coronary artery disease)   Acute renal failure superimposed on stage 3 chronic kidney disease (HCC)   Hyponatremia   Benign essential hypertension   SOB (shortness of breath)   Anemia   Risk for falls  Assessment and Plan:  Dyspnea: likely secondary to pulmonary edema as per CXR. Continue on IV lasix. Monitor I/Os  Possible aspiration: continue on IV unasyn, bronchodilators, mucinex. Encourage incentive spirometry   Anxiety: severity unknown. Continue on home dose of diazepam   DM2: well controlled, HbA1c 6.3, 3 months ago. Will start SSI w/ accuchecks    Hx of CAD: not on BB secondary to bradycardia as per cardio. No further inpatient cardiac work-up needed as per cardio   AKI on CKDIIIb: Cr is labile. Avoid nephrotoxic meds     Hyponatremia: trending up from day prior. ? hx of SIADH. Will continue to monitor  Hypokalemia: potassium given   Normocytic anemia: H&H are labile. No need for a transfusion currently   Right hepatic lobe mass: incidental finding on CT. Will need f/u imaging w/ CT or MRI w/ contrast but will hold off for now secondary to AKI on CKD    Thrombocytosis: etiology unclear. Will continue to monitor   DVT prophylaxis: SCDs Code Status: full  Family Communication: discussed pt's care w/ pt's family/friend at bedside and answered their questions  Disposition Plan: depends on PT/OT recs   Level of care: Telemetry Cardiac  Status is: Observation The patient remains OBS appropriate and will d/c before 2 midnights.    Consultants:  cardio  Procedures:   Antimicrobials: unasyn    Subjective: Pt c/o shortness of breath   Objective: Vitals:   10/22/21 0400 10/22/21 0430 10/22/21 0530  10/22/21 0917  BP: 140/68 125/71 (!) 122/59   Pulse: (!) 54 (!) 54 (!) 53   Resp: '17 16 16   '$ Temp:    97.7 F (36.5 C)  TempSrc:    Oral  SpO2: 100% 99% 98% 100%  Weight:      Height:        Intake/Output Summary (Last 24 hours) at 10/22/2021 0934 Last data filed at 10/22/2021 0142 Gross per 24 hour  Intake 100 ml  Output --  Net 100 ml   Filed Weights   10/21/21 1926  Weight: 89 kg    Examination:  General exam: Appears calm and comfortable  Respiratory system: diminished breath sounds b/l Cardiovascular system: S1 & S2 +. No  rubs, gallops or clicks.  Gastrointestinal system: Abdomen is nondistended, soft and nontender.  Normal bowel sounds heard. Central nervous system: Alert and oriented. Moves all extremities  Psychiatry: Judgement and insight appear normal. Mood & affect appropriate.     Data Reviewed: I have personally reviewed following labs and imaging studies  CBC: Recent Labs  Lab 10/21/21 1928 10/22/21 0739  WBC 12.4* 10.7*  HGB 11.4* 10.7*  HCT 33.5* 31.2*  MCV 87.9 87.9  PLT 451* 710*   Basic Metabolic Panel: Recent Labs  Lab 10/21/21 1928 10/22/21 0739  NA 123* 129*  K 3.8 3.0*  CL 80* 87*  CO2 28 30  GLUCOSE 206* 102*  BUN 30* 24*  CREATININE 1.64* 1.31*  CALCIUM 9.1 8.9  GFR: Estimated Creatinine Clearance: 31 mL/min (A) (by C-G formula based on SCr of 1.31 mg/dL (H)). Liver Function Tests: No results for input(s): "AST", "ALT", "ALKPHOS", "BILITOT", "PROT", "ALBUMIN" in the last 168 hours. No results for input(s): "LIPASE", "AMYLASE" in the last 168 hours. No results for input(s): "AMMONIA" in the last 168 hours. Coagulation Profile: Recent Labs  Lab 10/22/21 0739  INR 1.1   Cardiac Enzymes: No results for input(s): "CKTOTAL", "CKMB", "CKMBINDEX", "TROPONINI" in the last 168 hours. BNP (last 3 results) No results for input(s): "PROBNP" in the last 8760 hours. HbA1C: No results for input(s): "HGBA1C" in the last 72  hours. CBG: No results for input(s): "GLUCAP" in the last 168 hours. Lipid Profile: Recent Labs    10/22/21 0739  CHOL 87  HDL 40*  LDLCALC 17  TRIG 150*  CHOLHDL 2.2   Thyroid Function Tests: Recent Labs    10/21/21 2132  TSH 0.297*  FREET4 1.15*   Anemia Panel: No results for input(s): "VITAMINB12", "FOLATE", "FERRITIN", "TIBC", "IRON", "RETICCTPCT" in the last 72 hours. Sepsis Labs: Recent Labs  Lab 10/21/21 1928  PROCALCITON <0.10    No results found for this or any previous visit (from the past 240 hour(s)).       Radiology Studies: CT Chest Wo Contrast  Result Date: 10/21/2021 CLINICAL DATA:  Pneumonia, complication suspected, xray done. Cough, dyspnea. EXAM: CT CHEST WITHOUT CONTRAST TECHNIQUE: Multidetector CT imaging of the chest was performed following the standard protocol without IV contrast. RADIATION DOSE REDUCTION: This exam was performed according to the departmental dose-optimization program which includes automated exposure control, adjustment of the mA and/or kV according to patient size and/or use of iterative reconstruction technique. COMPARISON:  06/20/2021 FINDINGS: Cardiovascular: Moderate coronary artery calcification. Interval right coronary artery stenting. Global cardiac size within normal limits. No pericardial effusion. Central pulmonary arteries are of normal caliber. Mild atherosclerotic calcification within the thoracic aorta. No aortic aneurysm. Mediastinum/Nodes: No enlarged mediastinal or axillary lymph nodes. Thyroid gland, trachea, and esophagus demonstrate no significant findings. Lungs/Pleura: Lungs are clear. No pneumothorax or pleural effusion. Mild debris is seen within multiple lobar and segmental bronchi within the lung bases bilaterally. No central obstructing mass lesion. Upper Abdomen: Hypodense mass within the posterior right hepatic lobe is incompletely included on this examination but demonstrates interval increase in size since  remote prior examination of 03/17/2015 and demonstrates increasing central calcification. This is not well characterized on this examination. No acute abnormality identified. Musculoskeletal: No acute bone abnormality. Osseous structures are age-appropriate. 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 IMPRESSION: 1. No acute intrathoracic pathology identified. 2. Moderate coronary artery calcification. Interval right coronary artery stenting. 3. Debris within multiple lobar and segmental bronchi within the lung bases bilaterally. No central obstructing mass lesion identified. This may reflect changes of aspiration or mucous plugging. No focal pulmonary infiltrate. 4. Interval enlargement of a right hepatic lobe mass, incompletely characterized on this examination. This could be better assessed with dedicated contrast enhanced CT or MRI examination, if indicated. Electronically Signed   By: Fidela Salisbury M.D.   On: 10/21/2021 21:46   DG Chest 2 View  Result Date: 10/21/2021 CLINICAL DATA:  cough EXAM: CHEST - 2 VIEW COMPARISON:  Chest x-ray 03/16/2015. FINDINGS: The heart and mediastinal contours are unchanged. No focal consolidation. Slightly increased central markings. Trace left pleural effusion. No right pleural effusion. No pneumothorax. No acute osseous abnormality. IMPRESSION: 1. Likely mild pulmonary edema. 2. Trace left pleural effusion. Electronically Signed   By:  Iven Finn M.D.   On: 10/21/2021 19:58        Scheduled Meds:  aspirin EC  81 mg Oral QHS   clopidogrel  75 mg Oral Once   oxyCODONE  20 mg Oral Q breakfast   And   oxyCODONE  10 mg Oral 2 times per day   pantoprazole  40 mg Oral Daily   rosuvastatin  20 mg Oral QHS   sodium chloride flush  3 mL Intravenous Q12H   Continuous Infusions:  sodium chloride     ampicillin-sulbactam (UNASYN) IV Stopped (10/22/21 0142)     LOS: 0 days    Time spent: 35 mins     Wyvonnia Dusky, MD [facility] Pager [phone]  If 7PM-7AM, please contact night-coverage 10/22/2021, 9:34 AM

## 2021-10-22 NOTE — Progress Notes (Signed)
Admission profile updated. ?

## 2021-10-22 NOTE — Consult Note (Signed)
Hayley Knight NOTE       Patient ID: [name] MRN: [id number] DOB/AGE: May 02, 1935 86 y.o.  Admit date: 10/21/2021 Referring Physician Dr. Eppie Gibson Primary Physician  Primary Cardiologist Dr. Clayborn Bigness Reason for Consultation shortness of breath   HPI: Hayley Knight is an 25yoF with a PMH of CAD - LHC 07/09/21 showing CTO of mid RCA now s/p IVUS guided PCI x1 07/11/21, hypertension, type 2 diabetes, CKD 3, GERD, anxiety, allergies, right breast cancer s/p partial mastectomy, obesity, who presented to [facility] ED 10/21/21 with productive cough and wheezing x4 days.  Cardiology is consulted to evaluate if her shortness of breath could possibly be an anginal equivalent.  Patient states she had been coughing up gray phlegm, and was wheezing starting on Saturday.  She woke up the following day with a dry mouth, headache, and continued productive cough that has been worsening.  She had some positional dizziness when she goes from sitting to standing that usually resolves quickly after movement of rest, no syncope.  She states she saw Dr. Clayborn Bigness in February for worsening dyspnea on exertion, she was unable to ambulate in the store without significant shortness of breath and she also had some slight chest heaviness.  The symptoms resolved and she felt well after her heart cath and IVUS guided PCI in March.  She notes that her shortness of breath over the past 4 days is not associated with chest heaviness and is more so as she is coughing.  She denies any chest pain, does have rib pain only with coughing.  Has occasional dependent peripheral edema that resolves with elevation of her legs.  Over the past month she has felt rather fatigued and not very active, she is dealing with significant stressors of her daughter battling cancer and the recent loss of her son.  In my time of evaluation she is comfortable on room air, although audibly wheezing during our conversation with  frequent coughing and occasional production of sputum.  She is a never smoker, although occasionally exposed to secondhand smoke by her daughters.  Vitals are notable for recent blood pressure of 117/57 heart rate 60 in sinus rhythm, SPO2 98% on room air.  Labs are notable for hyponatremia which is improving from 123-129 overnight, potassium decreasing 3.8-3.0 after a dose of Lasix.  Renal function improving from a creatinine of 1.64 and GFR of 31 to creatinine 1.31, GFR 40 today.  BNP slightly elevated at 258, high-sensitivity troponin minimally elevated and flat trending at 42-45-37-30.  Leukocytosis to 12.4-10.7 on repeat today.  H&H stable at 10.7/31.2.  Chest x-ray with mild pulmonary edema.  CT chest without contrast without acute pathology however there is debris within multiple lobar and segmental bronchi in the lung bases bilaterally reflecting possible aspiration or mucous plugging without focal pulmonary infiltrate.  Review of systems complete and found to be negative unless listed above     Past Medical History:  Diagnosis Date   Arthritis    Diabetes mellitus without complication ([place])    Hypertension     Past Surgical History:  Procedure Laterality Date   ABDOMINAL HYSTERECTOMY     APPENDECTOMY     CHOLECYSTECTOMY     MASTECTOMY Right    REPLACEMENT TOTAL KNEE BILATERAL     RIGHT/LEFT HEART CATH AND CORONARY ANGIOGRAPHY Bilateral 07/09/2021   Procedure: RIGHT/LEFT HEART CATH AND CORONARY ANGIOGRAPHY;  Surgeon: Yolonda Kida, MD;  Location: [facility];  Service: Cardiovascular;  Laterality: Bilateral;  Medications Prior to Admission  Medication Sig Dispense Refill Last Dose   amLODipine (NORVASC) 2.5 MG tablet Take 2.5 mg by mouth daily.   10/21/2021   clopidogrel (PLAVIX) 75 MG tablet Take 75 mg by mouth daily.   10/21/2021   dexlansoprazole (DEXILANT) 60 MG capsule Take 60 mg by mouth daily.   10/21/2021   diazepam (VALIUM) 5 MG tablet Take 5 mg by mouth  every 12 (twelve) hours as needed for anxiety.   10/21/2021   escitalopram (LEXAPRO) 20 MG tablet Take 20 mg by mouth daily.   10/21/2021   famotidine (PEPCID) 20 MG tablet Take 20 mg by mouth 2 (two) times daily.   10/21/2021   furosemide (LASIX) 40 MG tablet Take 40 mg by mouth daily.   10/21/2021   hydrochlorothiazide (HYDRODIURIL) 12.5 MG tablet Take 12.5 mg by mouth every morning.   10/21/2021   losartan (COZAAR) 100 MG tablet Take 100 mg by mouth daily.   10/21/2021   Multiple Vitamin (MULTIVITAMIN) capsule Take 2 capsules by mouth daily.   10/21/2021   Multiple Vitamins-Minerals (ONE A DAY IMMUNITY DEFENSE PO) Take 2 tablets by mouth daily.   10/21/2021   omega-3 acid ethyl esters (LOVAZA) 1 G capsule Take 2 g by mouth 2 (two) times daily.   10/21/2021   oxyCODONE (OXYCONTIN) 10 mg 12 hr tablet Take 10-20 mg by mouth See admin instructions. Take 20 mg in the morning, 10 mg in the afternoon, and 10 mg at bedtime   10/21/2021   potassium chloride (K-DUR) 10 MEQ tablet Take 10 mEq by mouth daily.  2 10/21/2021   predniSONE (DELTASONE) 5 MG tablet Take 5 mg by mouth in the morning and at bedtime.   10/21/2021   sitaGLIPtin (JANUVIA) 100 MG tablet Take 100 mg by mouth daily.   10/21/2021   aspirin 81 MG EC tablet Take 81 mg by mouth at bedtime.   10/20/2021   fenofibrate (TRICOR) 145 MG tablet Take 145 mg by mouth at bedtime.   10/20/2021   fluticasone (CUTIVATE) 0.05 % cream Apply 1 application. topically daily as needed for rash.   prn at prn   gabapentin (NEURONTIN) 300 MG capsule Take 300 mg by mouth at bedtime.   10/20/2021   Melatonin 10 MG TABS Take 10 mg by mouth at bedtime.   10/20/2021   mirabegron ER (MYRBETRIQ) 25 MG TB24 tablet Take 1 tablet (25 mg total) by mouth daily. (Patient not taking: Reported on 10/22/2021) 30 tablet 11 Not Taking   naproxen sodium (ALEVE) 220 MG tablet Take 440 mg by mouth daily as needed (pain).   prn at prn   nitroGLYCERIN (NITROGLYN) 2 % ointment Apply 0.5 inches  topically every 6 (six) hours. 30 g 0 prn at prn   nitroGLYCERIN (NITROSTAT) 0.4 MG SL tablet Place under the tongue.   prn at prn   ondansetron (ZOFRAN) 4 MG tablet Take 4 mg by mouth every 6 (six) hours as needed. (Patient not taking: Reported on 10/22/2021)   Completed Course   ondansetron (ZOFRAN) 4 MG/2ML SOLN injection Inject 2 mLs (4 mg total) into the vein every 6 (six) hours as needed for nausea. (Patient not taking: Reported on 10/22/2021) 2 mL 0 Not Taking   rosuvastatin (CRESTOR) 20 MG tablet Take 1 tablet by mouth at bedtime.   10/20/2021   Social History   Socioeconomic History   Marital status: Widowed    Spouse name: Not on file   Number of children: Not on  file   Years of education: Not on file   Highest education level: Not on file  Occupational History   Not on file  Tobacco Use   Smoking status: Never   Smokeless tobacco: Never  Vaping Use   Vaping Use: Never used  Substance and Sexual Activity   Alcohol use: No   Drug use: No   Sexual activity: Not on file  Other Topics Concern   Not on file  Social History Narrative   Not on file   Social Determinants of Health   Financial Resource Strain: Not on file  Food Insecurity: Not on file  Transportation Needs: Not on file  Physical Activity: Not on file  Stress: Not on file  Social Connections: Not on file  Intimate Partner Violence: Not on file    No family history on file.    PHYSICAL EXAM General: Elderly Caucasian female, well nourished, in no acute distress.  Laying in incline in PCU bed. HEENT:  Normocephalic and atraumatic. Neck:  No JVD.  Lungs: Normal respiratory effort on room air with dry cough to deep inspiration..  Audible expiratory wheezes, without appreciable crackles or rhonchi.   Heart: HRRR . Normal S1 and S2 without gallops or murmurs. Radial & DP pulses 2+ bilaterally. Abdomen: Obese appearing.  Msk: Normal strength and tone for age. Extremities: Warm and well perfused. No clubbing,  cyanosis.  No peripheral edema.  Neuro: Alert and oriented X 3. Psych:  Answers questions appropriately.   Labs:   Lab Results  Component Value Date   WBC 10.7 (H) 10/22/2021   HGB 10.7 (L) 10/22/2021   HCT 31.2 (L) 10/22/2021   MCV 87.9 10/22/2021   PLT 447 (H) 10/22/2021    Recent Labs  Lab 10/22/21 0739  NA 129*  K 3.0*  CL 87*  CO2 30  BUN 24*  CREATININE 1.31*  CALCIUM 8.9  GLUCOSE 102*   Lab Results  Component Value Date   CKTOTAL 342 (H) 12/30/2012   TROPONINI < 0.02 06/02/2014    Lab Results  Component Value Date   CHOL 87 10/22/2021   Lab Results  Component Value Date   HDL 40 (L) 10/22/2021   Lab Results  Component Value Date   LDLCALC 17 10/22/2021   Lab Results  Component Value Date   TRIG 150 (H) 10/22/2021   Lab Results  Component Value Date   CHOLHDL 2.2 10/22/2021   No results found for: "LDLDIRECT"    Radiology: ECHOCARDIOGRAM COMPLETE  Result Date: 10/22/2021    ECHOCARDIOGRAM REPORT   Patient Name:   Hayley Knight Date of Exam: 10/22/2021 Medical Rec #:  [id number]         Height:       59.0 in Accession #:    [id number]        Weight:       196.2 lb Date of Birth:  25-Mar-1936          BSA:          1.829 m Patient Age:    19 years          BP:           122/59 mmHg Patient Gender: F                 HR:           53 bpm. Exam Location:  [facility] Procedure: 2D Echo, Cardiac Doppler and Color Doppler Indications:     Shortness of  breath  History:         Patient has no prior history of Echocardiogram examinations.                  Risk Factors:Hypertension and Diabetes.  Sonographer:     Sherrie Sport Referring Phys:  [place] Diagnosing Phys: Neoma Laming  Sonographer Comments: Suboptimal apical window. IMPRESSIONS  1. Left ventricular ejection fraction, by estimation, is 60 to 65%. The left ventricle has normal function. The left ventricle has no regional wall motion abnormalities. There is moderate concentric left ventricular  hypertrophy. Left ventricular diastolic parameters are consistent with Grade III diastolic dysfunction (restrictive).  2. Right ventricular systolic function is normal. The right ventricular size is normal.  3. Left atrial size was moderately dilated.  4. Right atrial size was moderately dilated.  5. The mitral valve is normal in structure. No evidence of mitral valve regurgitation. No evidence of mitral stenosis.  6. The aortic valve is normal in structure. Aortic valve regurgitation is not visualized. Aortic valve sclerosis is present, with no evidence of aortic valve stenosis.  7. The inferior vena cava is normal in size with greater than 50% respiratory variability, suggesting right atrial pressure of 3 mmHg. FINDINGS  Left Ventricle: Left ventricular ejection fraction, by estimation, is 60 to 65%. The left ventricle has normal function. The left ventricle has no regional wall motion abnormalities. The left ventricular internal cavity size was normal in size. There is  moderate concentric left ventricular hypertrophy. Left ventricular diastolic parameters are consistent with Grade III diastolic dysfunction (restrictive). Right Ventricle: The right ventricular size is normal. No increase in right ventricular wall thickness. Right ventricular systolic function is normal. Left Atrium: Left atrial size was moderately dilated. Right Atrium: Right atrial size was moderately dilated. Pericardium: There is no evidence of pericardial effusion. Mitral Valve: The mitral valve is normal in structure. No evidence of mitral valve regurgitation. No evidence of mitral valve stenosis. Tricuspid Valve: The tricuspid valve is normal in structure. Tricuspid valve regurgitation is trivial. No evidence of tricuspid stenosis. Aortic Valve: The aortic valve is normal in structure. Aortic valve regurgitation is not visualized. Aortic valve sclerosis is present, with no evidence of aortic valve stenosis. Aortic valve mean gradient  measures 4.0 mmHg. Aortic valve peak gradient measures 8.1 mmHg. Aortic valve area, by VTI measures 2.20 cm. Pulmonic Valve: The pulmonic valve was normal in structure. Pulmonic valve regurgitation is not visualized. No evidence of pulmonic stenosis. Aorta: The aortic root is normal in size and structure. Venous: The inferior vena cava is normal in size with greater than 50% respiratory variability, suggesting right atrial pressure of 3 mmHg. IAS/Shunts: No atrial level shunt detected by color flow Doppler.  LEFT VENTRICLE PLAX 2D LVOT diam:     2.00 cm   Diastology LV SV:         66        LV e' medial:    5.11 cm/s LV SV Index:   36        LV E/e' medial:  18.5 LVOT Area:     3.14 cm  LV e' lateral:   4.13 cm/s                          LV E/e' lateral: 22.9  RIGHT VENTRICLE RV Basal diam:  3.00 cm RV S prime:     11.40 cm/s TAPSE (M-mode): 1.8 cm LEFT ATRIUM  Index        RIGHT ATRIUM           Index LA diam:        3.30 cm 1.80 cm/m   RA Area:     17.80 cm LA Vol (A2C):   80.7 ml 44.11 ml/m  RA Volume:   51.20 ml  27.99 ml/m LA Vol (A4C):   46.6 ml 25.47 ml/m LA Biplane Vol: 62.1 ml 33.95 ml/m  AORTIC VALVE AV Area (Vmax):    2.23 cm AV Area (Vmean):   2.06 cm AV Area (VTI):     2.20 cm AV Vmax:           142.00 cm/s AV Vmean:          88.500 cm/s AV VTI:            0.298 m AV Peak Grad:      8.1 mmHg AV Mean Grad:      4.0 mmHg LVOT Vmax:         101.00 cm/s LVOT Vmean:        57.900 cm/s LVOT VTI:          0.209 m LVOT/AV VTI ratio: 0.70  AORTA Ao Root diam: 2.70 cm MITRAL VALVE                TRICUSPID VALVE MV Area (PHT): 2.97 cm     TR Peak grad:   30.0 mmHg MV Decel Time: 255 msec     TR Vmax:        274.00 cm/s MV E velocity: 94.70 cm/s MV A velocity: 102.00 cm/s  SHUNTS MV E/A ratio:  0.93         Systemic VTI:  0.21 m                             Systemic Diam: 2.00 cm Neoma Laming Electronically signed by Neoma Laming Signature Date/Time: 10/22/2021/12:17:39 PM    Final    CT  Chest Wo Contrast  Result Date: 10/21/2021 CLINICAL DATA:  Pneumonia, complication suspected, xray done. Cough, dyspnea. EXAM: CT CHEST WITHOUT CONTRAST TECHNIQUE: Multidetector CT imaging of the chest was performed following the standard protocol without IV contrast. RADIATION DOSE REDUCTION: This exam was performed according to the departmental dose-optimization program which includes automated exposure control, adjustment of the mA and/or kV according to patient size and/or use of iterative reconstruction technique. COMPARISON:  06/20/2021 FINDINGS: Cardiovascular: Moderate coronary artery calcification. Interval right coronary artery stenting. Global cardiac size within normal limits. No pericardial effusion. Central pulmonary arteries are of normal caliber. Mild atherosclerotic calcification within the thoracic aorta. No aortic aneurysm. Mediastinum/Nodes: No enlarged mediastinal or axillary lymph nodes. Thyroid gland, trachea, and esophagus demonstrate no significant findings. Lungs/Pleura: Lungs are clear. No pneumothorax or pleural effusion. Mild debris is seen within multiple lobar and segmental bronchi within the lung bases bilaterally. No central obstructing mass lesion. Upper Abdomen: Hypodense mass within the posterior right hepatic lobe is incompletely included on this examination but demonstrates interval increase in size since remote prior examination of 03/17/2015 and demonstrates increasing central calcification. This is not well characterized on this examination. No acute abnormality identified. Musculoskeletal: No acute bone abnormality. Osseous structures are age-appropriate. 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 IMPRESSION: 1. No acute intrathoracic pathology identified. 2. Moderate coronary artery calcification. Interval right coronary artery stenting. 3. Debris within multiple lobar and segmental bronchi within the lung bases  bilaterally. No central obstructing mass lesion  identified. This may reflect changes of aspiration or mucous plugging. No focal pulmonary infiltrate. 4. Interval enlargement of a right hepatic lobe mass, incompletely characterized on this examination. This could be better assessed with dedicated contrast enhanced CT or MRI examination, if indicated. Electronically Signed   By: Fidela Salisbury M.D.   On: 10/21/2021 21:46   DG Chest 2 View  Result Date: 10/21/2021 CLINICAL DATA:  cough EXAM: CHEST - 2 VIEW COMPARISON:  Chest x-ray 03/16/2015. FINDINGS: The heart and mediastinal contours are unchanged. No focal consolidation. Slightly increased central markings. Trace left pleural effusion. No right pleural effusion. No pneumothorax. No acute osseous abnormality. IMPRESSION: 1. Likely mild pulmonary edema. 2. Trace left pleural effusion. Electronically Signed   By: Iven Finn M.D.   On: 10/21/2021 19:58    ECHO 10/22/2021  1. Left ventricular ejection fraction, by estimation, is 60 to 65%. The  left ventricle has normal function. The left ventricle has no regional  wall motion abnormalities. There is moderate concentric left ventricular  hypertrophy. Left ventricular  diastolic parameters are consistent with Grade III diastolic dysfunction  (restrictive).   2. Right ventricular systolic function is normal. The right ventricular  size is normal.   3. Left atrial size was moderately dilated.   4. Right atrial size was moderately dilated.   5. The mitral valve is normal in structure. No evidence of mitral valve  regurgitation. No evidence of mitral stenosis.   6. The aortic valve is normal in structure. Aortic valve regurgitation is  not visualized. Aortic valve sclerosis is present, with no evidence of  aortic valve stenosis.   7. The inferior vena cava is normal in size with greater than 50%  respiratory variability, suggesting right atrial pressure of 3 mmHg.   TELEMETRY reviewed by me: Sinus rhythm rate 62-70  EKG reviewed by me:  Sinus rhythm, RBBB rate 72, overall similar to 06/2021.  ASSESSMENT AND PLAN:  Luis Sami is an 51yoF with a PMH of CAD - LHC 07/09/21 showing CTO of mid RCA now s/p IVUS guided PCI x1 07/11/21, hypertension, type 2 diabetes, CKD 3, GERD, anxiety, allergies, right breast cancer s/p partial mastectomy, obesity, who presented to [facility] ED 10/21/21 with productive cough and wheezing x4 days.  Cardiology is consulted to evaluate if her shortness of breath could possibly be an anginal equivalent.  #Shortness of breath #CAD s/p IVUS guided PCI x1 to the RCA 06/2021 The patient presents with a 4-day history of productive cough, shortness of breath, and wheezing.  She describes this dyspnea as much different and associated with her cough compared to February 2023 before her heart cath.  She has rib pain with coughing, but no chest heaviness or discomfort otherwise.  Her EKG shows sinus rhythm with a right bundle branch block without acute ischemic changes.  Her troponin is minimally elevated and flat trending at 42-45-37-30, possibly related to her renal dysfunction on admission, not ACS.  She has been compliant with DAPT since her heart catheterization.  CT chest shows changes possibly related to aspiration or mucous plugging. -Agree with current antibiotic therapy and consider noncardiac causes of shortness of breath, consider nebulizer treatment for wheezing -Continue uninterrupted DAPT with aspirin and Plavix to complete 6 months (September) -Continue Crestor 20 mg daily -No further cardiac diagnostics necessary, she has a follow-up appointment with Dr. Clayborn Bigness scheduled for July 20  #HFpEF (G3 DD, LVH 10/21/2021) #Hypertension -BNP slightly elevated at 258 on admission,  chest x-ray with mild pulmonary edema and a trace left pleural effusion, not clinically volume overloaded on exam today after receiving IV Lasix 40 mg x 1.  She is notably hyponatremic which is improving after a liter of fluids with a  sodium of 129. -Continue GDMT with losartan 100 mg daily, Lasix 40 mg once daily.  Not on a beta-blocker possibly due to bradycardia   #CKD 3 #Hyponatremia -Recommend discontinuing HCTZ 12.5, continue other medications as above.  Cardiology signing off. Please reengage if needed.   This patient's plan of care was discussed and created with Dr. Saralyn Pilar and he is in agreement.  Signed: Tristan Schroeder , PA-C 10/22/2021, 1:49 PM [facility]

## 2021-10-22 NOTE — Progress Notes (Signed)
*  PRELIMINARY RESULTS* Echocardiogram 2D Echocardiogram has been performed.  Sherrie Sport 10/22/2021, 10:29 AM

## 2021-10-22 NOTE — Progress Notes (Signed)
Patient incontinent of stool. Peri care performed and new brief placed.

## 2021-10-22 NOTE — Progress Notes (Signed)
Pharmacy Antibiotic Note  Hayley Knight is a 86 y.o. female admitted on 10/21/2021 with aspiration pneumonia.  Pharmacy has been consulted for Unasyn dosing.  Plan: Unasyn 3 gm q12h x 5 days per indication & renal fxn.  Pharmacy will continue to follow and will adjust abx dosing whenever warranted.  Height: '4\' 11"'$  (149.9 cm) Weight: 89 kg (196 lb 3.4 oz) IBW/kg (Calculated) : 43.2  Temp (24hrs), Avg:98.6 F (37 C), Min:98.6 F (37 C), Max:98.6 F (37 C)  Recent Labs  Lab 10/21/21 1928  WBC 12.4*  CREATININE 1.64*    Estimated Creatinine Clearance: 24.8 mL/min (A) (by C-G formula based on SCr of 1.64 mg/dL (H)).    Allergies  Allergen Reactions   Ivp Dye [Iodinated Contrast Media] Swelling   Sulfa Antibiotics Swelling    Antimicrobials this admission: 6/28 Unasyn >> x 5 days  Microbiology results: No lab cx ordered or pending at this time.  Thank you for allowing pharmacy to be a part of this patient's care.  Renda Rolls, PharmD, [facility] 10/22/2021 3:42 AM

## 2021-10-23 DIAGNOSIS — E876 Hypokalemia: Secondary | ICD-10-CM | POA: Diagnosis not present

## 2021-10-23 DIAGNOSIS — Z7902 Long term (current) use of antithrombotics/antiplatelets: Secondary | ICD-10-CM | POA: Diagnosis not present

## 2021-10-23 DIAGNOSIS — D649 Anemia, unspecified: Secondary | ICD-10-CM | POA: Diagnosis not present

## 2021-10-23 DIAGNOSIS — R059 Cough, unspecified: Secondary | ICD-10-CM | POA: Diagnosis not present

## 2021-10-23 DIAGNOSIS — J810 Acute pulmonary edema: Secondary | ICD-10-CM | POA: Diagnosis not present

## 2021-10-23 DIAGNOSIS — E1122 Type 2 diabetes mellitus with diabetic chronic kidney disease: Secondary | ICD-10-CM | POA: Diagnosis not present

## 2021-10-23 DIAGNOSIS — E871 Hypo-osmolality and hyponatremia: Secondary | ICD-10-CM | POA: Diagnosis not present

## 2021-10-23 DIAGNOSIS — Z853 Personal history of malignant neoplasm of breast: Secondary | ICD-10-CM | POA: Diagnosis not present

## 2021-10-23 DIAGNOSIS — R160 Hepatomegaly, not elsewhere classified: Secondary | ICD-10-CM | POA: Diagnosis not present

## 2021-10-23 DIAGNOSIS — N179 Acute kidney failure, unspecified: Secondary | ICD-10-CM | POA: Diagnosis not present

## 2021-10-23 DIAGNOSIS — R0600 Dyspnea, unspecified: Secondary | ICD-10-CM | POA: Diagnosis not present

## 2021-10-23 DIAGNOSIS — I129 Hypertensive chronic kidney disease with stage 1 through stage 4 chronic kidney disease, or unspecified chronic kidney disease: Secondary | ICD-10-CM | POA: Diagnosis not present

## 2021-10-23 DIAGNOSIS — D75839 Thrombocytosis, unspecified: Secondary | ICD-10-CM | POA: Diagnosis not present

## 2021-10-23 DIAGNOSIS — Z7982 Long term (current) use of aspirin: Secondary | ICD-10-CM | POA: Diagnosis not present

## 2021-10-23 DIAGNOSIS — Z79899 Other long term (current) drug therapy: Secondary | ICD-10-CM | POA: Diagnosis not present

## 2021-10-23 DIAGNOSIS — N1832 Chronic kidney disease, stage 3b: Secondary | ICD-10-CM | POA: Diagnosis not present

## 2021-10-23 DIAGNOSIS — Z7984 Long term (current) use of oral hypoglycemic drugs: Secondary | ICD-10-CM | POA: Diagnosis not present

## 2021-10-23 DIAGNOSIS — I2510 Atherosclerotic heart disease of native coronary artery without angina pectoris: Secondary | ICD-10-CM | POA: Diagnosis not present

## 2021-10-23 LAB — URINE CULTURE: Culture: <10000 — AB

## 2021-10-23 LAB — BASIC METABOLIC PANEL
Anion gap: 11 (ref 5–15)
BUN: 25 mg/dL — ABNORMAL HIGH (ref 8–23)
CO2: 31 mmol/L (ref 22–32)
Calcium: 8.9 mg/dL (ref 8.9–10.3)
Chloride: 92 mmol/L — ABNORMAL LOW (ref 98–111)
Creatinine, Ser: 1.19 mg/dL — ABNORMAL HIGH (ref 0.44–1.00)
GFR, Estimated: 45 mL/min — ABNORMAL LOW (ref >60)
Glucose, Bld: 171 mg/dL — ABNORMAL HIGH (ref 70–99)
Potassium: 3.7 mmol/L (ref 3.5–5.1)
Sodium: 134 mmol/L — ABNORMAL LOW (ref 135–145)

## 2021-10-23 LAB — CBC
HCT: 33.2 % — ABNORMAL LOW (ref 36.0–46.0)
Hemoglobin: 11.3 g/dL — ABNORMAL LOW (ref 12.0–15.0)
MCH: 30.1 pg (ref 26.0–34.0)
MCHC: 34 g/dL (ref 30.0–36.0)
MCV: 88.5 fL (ref 80.0–100.0)
Platelets: 434 10*3/uL — ABNORMAL HIGH (ref 150–400)
RBC: 3.75 MIL/uL — ABNORMAL LOW (ref 3.87–5.11)
RDW: 12.3 % (ref 11.5–15.5)
WBC: 9.8 10*3/uL (ref 4.0–10.5)
nRBC: 0 % (ref 0.0–0.2)

## 2021-10-23 LAB — GLUCOSE, CAPILLARY
Glucose-Capillary: 178 mg/dL — ABNORMAL HIGH (ref 70–99)
Glucose-Capillary: 198 mg/dL — ABNORMAL HIGH (ref 70–99)
Glucose-Capillary: 213 mg/dL — ABNORMAL HIGH (ref 70–99)
Glucose-Capillary: 237 mg/dL — ABNORMAL HIGH (ref 70–99)

## 2021-10-23 LAB — LIPOPROTEIN A (LPA): Lipoprotein (a): 53.5 nmol/L — ABNORMAL HIGH (ref <75.0)

## 2021-10-23 LAB — HEMOGLOBIN A1C
Hgb A1c MFr Bld: 6.2 % — ABNORMAL HIGH (ref 4.8–5.6)
Mean Plasma Glucose: 131 mg/dL

## 2021-10-23 MED ORDER — MELATONIN 5 MG PO TABS
5.0000 mg | ORAL_TABLET | Freq: Every day | ORAL | Status: DC
  Administered 2021-10-23: 5 mg via ORAL
  Filled 2021-10-23: qty 1

## 2021-10-23 NOTE — Progress Notes (Signed)
PROGRESS NOTE    Hayley Knight  NLZ:[id number] DOB: [birth year] DOA: 10/21/2021 PCP: Danelle Berry, NP   Assessment & Plan:   Principal Problem:   Cough Active Problems:   Dyspnea   Anxiety   Diabetes mellitus type 2, uncomplicated (HCC)   CAD (coronary artery disease)   Acute renal failure superimposed on stage 3 chronic kidney disease (HCC)   Hyponatremia   Benign essential hypertension   SOB (shortness of breath)   Anemia   Risk for falls  Assessment and Plan:  Dyspnea: likely secondary to pulmonary edema as per CXR. Continue on IV lasix. Monitor I/Os   Possible aspiration: continue on IV unasyn, bronchodilators, mucinex. Encourage incentive spirometry   Anxiety: severity unknown. Continue on home dose of diazepam    DM2: HbA1c 6.3, well controlled. Continue on SSI w/ accuchecks  Hx of CAD: not on BB secondary to bradycardia as per cardio. No further inpatient cardiac work-up needed as per cardio   AKI on CKDIIIb: Cr is trending down. Avoid nephrotoxic meds     Hyponatremia: continues to trend up from day prior. ? hx of SIADH. Will continue to monitor   Hypokalemia: WNL today   Normocytic anemia: H&H are stable. No need for a transfusion currently   Right hepatic lobe mass: incidental finding on CT. Will need f/u imaging w/ CT or MRI w/ contrast but will hold off for now secondary to AKI on CKD    Thrombocytosis: etiology unclear, labile. Will continue to monitor   Obesity: BMI 38.5. Complicates overall care & prognosis   DVT prophylaxis: SCDs Code Status: full  Family Communication:  Disposition Plan: likely d/c back as pt is adamantly refusing to work w/ PT/OT   Level of care: Telemetry Cardiac  Status is: Observation The patient remains OBS appropriate and will d/c before 2 midnights.    Consultants:  cardio  Procedures:   Antimicrobials: unasyn    Subjective: Pt c/o malaise   Objective: Vitals:   10/22/21 2151 10/22/21 2350  10/23/21 0437 10/23/21 0741  BP:  (!) 122/54 134/67 (!) 141/61  Pulse:  68 61 (!) 59  Resp:  18  14  Temp:  98.5 F (36.9 C) 97.8 F (36.6 C) 98.2 F (36.8 C)  TempSrc:  Oral Oral   SpO2: 94% 95% 99% 96%  Weight:      Height:        Intake/Output Summary (Last 24 hours) at 10/23/2021 9024 Last data filed at 10/23/2021 0500 Gross per 24 hour  Intake 780 ml  Output 2400 ml  Net -1620 ml   Filed Weights   10/21/21 1926 10/22/21 1041  Weight: 89 kg 86.6 kg    Examination:  General exam: Appears comfortable  Respiratory system: decreased breath sounds b/l  Cardiovascular system: S1/S2+. No rubs or clicks   Gastrointestinal system: Abd is soft, NT, obese & hypoactive bowel sounds  Central nervous system: Alert and oriented. Moves all extremities   Psychiatry: judgement and insight appears at baseline. Flat mood and affect    Data Reviewed: I have personally reviewed following labs and imaging studies  CBC: Recent Labs  Lab 10/21/21 1928 10/22/21 0739 10/23/21 0435  WBC 12.4* 10.7* 9.8  HGB 11.4* 10.7* 11.3*  HCT 33.5* 31.2* 33.2*  MCV 87.9 87.9 88.5  PLT 451* 447* 097*   Basic Metabolic Panel: Recent Labs  Lab 10/21/21 1928 10/22/21 0739 10/23/21 0435  NA 123* 129* 134*  K 3.8 3.0* 3.7  CL 80* 87*  92*  CO2 '28 30 31  '$ GLUCOSE 206* 102* 171*  BUN 30* 24* 25*  CREATININE 1.64* 1.31* 1.19*  CALCIUM 9.1 8.9 8.9   GFR: Estimated Creatinine Clearance: 33.7 mL/min (A) (by C-G formula based on SCr of 1.19 mg/dL (H)). Liver Function Tests: No results for input(s): "AST", "ALT", "ALKPHOS", "BILITOT", "PROT", "ALBUMIN" in the last 168 hours. No results for input(s): "LIPASE", "AMYLASE" in the last 168 hours. No results for input(s): "AMMONIA" in the last 168 hours. Coagulation Profile: Recent Labs  Lab 10/22/21 0739  INR 1.1   Cardiac Enzymes: No results for input(s): "CKTOTAL", "CKMB", "CKMBINDEX", "TROPONINI" in the last 168 hours. BNP (last 3 results) No  results for input(s): "PROBNP" in the last 8760 hours. HbA1C: Recent Labs    10/22/21 0739  HGBA1C 6.2*   CBG: Recent Labs  Lab 10/22/21 1638 10/22/21 2042 10/23/21 0737  GLUCAP 143* 173* 178*   Lipid Profile: Recent Labs    10/22/21 0739  CHOL 87  HDL 40*  LDLCALC 17  TRIG 150*  CHOLHDL 2.2   Thyroid Function Tests: Recent Labs    10/21/21 2132  TSH 0.297*  FREET4 1.15*   Anemia Panel: No results for input(s): "VITAMINB12", "FOLATE", "FERRITIN", "TIBC", "IRON", "RETICCTPCT" in the last 72 hours. Sepsis Labs: Recent Labs  Lab 10/21/21 1928  PROCALCITON <0.10    No results found for this or any previous visit (from the past 240 hour(s)).       Radiology Studies: ECHOCARDIOGRAM COMPLETE  Result Date: 10/22/2021    ECHOCARDIOGRAM REPORT   Patient Name:   Hayley Knight Date of Exam: 10/22/2021 Medical Rec #:  [id number]         Height:       59.0 in Accession #:    [id number]        Weight:       196.2 lb Date of Birth:  02-05-1936          BSA:          1.829 m Patient Age:    86 years          BP:           122/59 mmHg Patient Gender: F                 HR:           53 bpm. Exam Location:  [facility] Procedure: 2D Echo, Cardiac Doppler and Color Doppler Indications:     Shortness of breath  History:         Patient has no prior history of Echocardiogram examinations.                  Risk Factors:Hypertension and Diabetes.  Sonographer:     Sherrie Sport Referring Phys:  [place] Diagnosing Phys: Neoma Laming  Sonographer Comments: Suboptimal apical window. IMPRESSIONS  1. Left ventricular ejection fraction, by estimation, is 60 to 65%. The left ventricle has normal function. The left ventricle has no regional wall motion abnormalities. There is moderate concentric left ventricular hypertrophy. Left ventricular diastolic parameters are consistent with Grade III diastolic dysfunction (restrictive).  2. Right ventricular systolic function is normal. The right  ventricular size is normal.  3. Left atrial size was moderately dilated.  4. Right atrial size was moderately dilated.  5. The mitral valve is normal in structure. No evidence of mitral valve regurgitation. No evidence of mitral stenosis.  6. The aortic valve is normal in  structure. Aortic valve regurgitation is not visualized. Aortic valve sclerosis is present, with no evidence of aortic valve stenosis.  7. The inferior vena cava is normal in size with greater than 50% respiratory variability, suggesting right atrial pressure of 3 mmHg. FINDINGS  Left Ventricle: Left ventricular ejection fraction, by estimation, is 60 to 65%. The left ventricle has normal function. The left ventricle has no regional wall motion abnormalities. The left ventricular internal cavity size was normal in size. There is  moderate concentric left ventricular hypertrophy. Left ventricular diastolic parameters are consistent with Grade III diastolic dysfunction (restrictive). Right Ventricle: The right ventricular size is normal. No increase in right ventricular wall thickness. Right ventricular systolic function is normal. Left Atrium: Left atrial size was moderately dilated. Right Atrium: Right atrial size was moderately dilated. Pericardium: There is no evidence of pericardial effusion. Mitral Valve: The mitral valve is normal in structure. No evidence of mitral valve regurgitation. No evidence of mitral valve stenosis. Tricuspid Valve: The tricuspid valve is normal in structure. Tricuspid valve regurgitation is trivial. No evidence of tricuspid stenosis. Aortic Valve: The aortic valve is normal in structure. Aortic valve regurgitation is not visualized. Aortic valve sclerosis is present, with no evidence of aortic valve stenosis. Aortic valve mean gradient measures 4.0 mmHg. Aortic valve peak gradient measures 8.1 mmHg. Aortic valve area, by VTI measures 2.20 cm. Pulmonic Valve: The pulmonic valve was normal in structure. Pulmonic valve  regurgitation is not visualized. No evidence of pulmonic stenosis. Aorta: The aortic root is normal in size and structure. Venous: The inferior vena cava is normal in size with greater than 50% respiratory variability, suggesting right atrial pressure of 3 mmHg. IAS/Shunts: No atrial level shunt detected by color flow Doppler.  LEFT VENTRICLE PLAX 2D LVOT diam:     2.00 cm   Diastology LV SV:         66        LV e' medial:    5.11 cm/s LV SV Index:   36        LV E/e' medial:  18.5 LVOT Area:     3.14 cm  LV e' lateral:   4.13 cm/s                          LV E/e' lateral: 22.9  RIGHT VENTRICLE RV Basal diam:  3.00 cm RV S prime:     11.40 cm/s TAPSE (M-mode): 1.8 cm LEFT ATRIUM             Index        RIGHT ATRIUM           Index LA diam:        3.30 cm 1.80 cm/m   RA Area:     17.80 cm LA Vol (A2C):   80.7 ml 44.11 ml/m  RA Volume:   51.20 ml  27.99 ml/m LA Vol (A4C):   46.6 ml 25.47 ml/m LA Biplane Vol: 62.1 ml 33.95 ml/m  AORTIC VALVE AV Area (Vmax):    2.23 cm AV Area (Vmean):   2.06 cm AV Area (VTI):     2.20 cm AV Vmax:           142.00 cm/s AV Vmean:          88.500 cm/s AV VTI:            0.298 m AV Peak Grad:      8.1 mmHg AV Mean Grad:  4.0 mmHg LVOT Vmax:         101.00 cm/s LVOT Vmean:        57.900 cm/s LVOT VTI:          0.209 m LVOT/AV VTI ratio: 0.70  AORTA Ao Root diam: 2.70 cm MITRAL VALVE                TRICUSPID VALVE MV Area (PHT): 2.97 cm     TR Peak grad:   30.0 mmHg MV Decel Time: 255 msec     TR Vmax:        274.00 cm/s MV E velocity: 94.70 cm/s MV A velocity: 102.00 cm/s  SHUNTS MV E/A ratio:  0.93         Systemic VTI:  0.21 m                             Systemic Diam: 2.00 cm Neoma Laming Electronically signed by Neoma Laming Signature Date/Time: 10/22/2021/12:17:39 PM    Final    CT Chest Wo Contrast  Result Date: 10/21/2021 CLINICAL DATA:  Pneumonia, complication suspected, xray done. Cough, dyspnea. EXAM: CT CHEST WITHOUT CONTRAST TECHNIQUE: Multidetector CT  imaging of the chest was performed following the standard protocol without IV contrast. RADIATION DOSE REDUCTION: This exam was performed according to the departmental dose-optimization program which includes automated exposure control, adjustment of the mA and/or kV according to patient size and/or use of iterative reconstruction technique. COMPARISON:  06/20/2021 FINDINGS: Cardiovascular: Moderate coronary artery calcification. Interval right coronary artery stenting. Global cardiac size within normal limits. No pericardial effusion. Central pulmonary arteries are of normal caliber. Mild atherosclerotic calcification within the thoracic aorta. No aortic aneurysm. Mediastinum/Nodes: No enlarged mediastinal or axillary lymph nodes. Thyroid gland, trachea, and esophagus demonstrate no significant findings. Lungs/Pleura: Lungs are clear. No pneumothorax or pleural effusion. Mild debris is seen within multiple lobar and segmental bronchi within the lung bases bilaterally. No central obstructing mass lesion. Upper Abdomen: Hypodense mass within the posterior right hepatic lobe is incompletely included on this examination but demonstrates interval increase in size since remote prior examination of 03/17/2015 and demonstrates increasing central calcification. This is not well characterized on this examination. No acute abnormality identified. Musculoskeletal: No acute bone abnormality. Osseous structures are age-appropriate. 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 IMPRESSION: 1. No acute intrathoracic pathology identified. 2. Moderate coronary artery calcification. Interval right coronary artery stenting. 3. Debris within multiple lobar and segmental bronchi within the lung bases bilaterally. No central obstructing mass lesion identified. This may reflect changes of aspiration or mucous plugging. No focal pulmonary infiltrate. 4. Interval enlargement of a right hepatic lobe mass, incompletely characterized on  this examination. This could be better assessed with dedicated contrast enhanced CT or MRI examination, if indicated. Electronically Signed   By: Fidela Salisbury M.D.   On: 10/21/2021 21:46   DG Chest 2 View  Result Date: 10/21/2021 CLINICAL DATA:  cough EXAM: CHEST - 2 VIEW COMPARISON:  Chest x-ray 03/16/2015. FINDINGS: The heart and mediastinal contours are unchanged. No focal consolidation. Slightly increased central markings. Trace left pleural effusion. No right pleural effusion. No pneumothorax. No acute osseous abnormality. IMPRESSION: 1. Likely mild pulmonary edema. 2. Trace left pleural effusion. Electronically Signed   By: Iven Finn M.D.   On: 10/21/2021 19:58        Scheduled Meds:  aspirin EC  81 mg Oral QHS   clopidogrel  75 mg Oral  Daily   dextromethorphan-guaiFENesin  1 tablet Oral BID   escitalopram  20 mg Oral Daily   furosemide  40 mg Intravenous Daily   insulin aspart  0-6 Units Subcutaneous TID WC   methylPREDNISolone (SOLU-MEDROL) injection  40 mg Intravenous Q12H   oxyCODONE  20 mg Oral Q breakfast   And   oxyCODONE  10 mg Oral 2 times per day   pantoprazole  40 mg Oral Daily   rosuvastatin  20 mg Oral QHS   sodium chloride flush  3 mL Intravenous Q12H   Continuous Infusions:  sodium chloride     ampicillin-sulbactam (UNASYN) IV 3 g (10/23/21 0507)     LOS: 0 days    Time spent: 25 mins     Wyvonnia Dusky, MD [facility] Pager [phone]  If 7PM-7AM, please contact night-coverage 10/23/2021, 8:22 AM

## 2021-10-23 NOTE — Progress Notes (Signed)
OT Cancellation Note  Patient Details Name: Hayley Knight MRN: [id number] DOB: [birth year]   Cancelled Treatment:    Reason Eval/Treat Not Completed: Patient declined, no reason specified. Consult received, chart reviewed. Upon attempt, pt endorsing she was independent before this and was going to be independent while here, declining OT services. Agreeable to OT checking on her this afternoon to ensure she's still doing well.   Ardeth Perfect., MPH, MS, OTR/L ascom [phone] 10/23/21, 10:28 AM

## 2021-10-23 NOTE — Progress Notes (Signed)
OT Cancellation Note  Patient Details Name: JOSHLYNN ALFONZO MRN: [id number] DOB: 1935-06-13   Cancelled Treatment:    Reason Eval/Treat Not Completed: Patient declined, no reason specified. Pt continues to adamantly declining OT services. Will sign off.   Ardeth Perfect., MPH, MS, OTR/L ascom [phone] 10/23/21, 1:25 PM

## 2021-10-23 NOTE — Progress Notes (Signed)
PT Cancellation Note  Patient Details Name: Hayley Knight MRN: [id number] DOB: 1936-02-01   Cancelled Treatment:    Reason Eval/Treat Not Completed: Patient declined, no reason specified. Second attempt this afternoon. Encouraged pt on benefits of OOB mobility. Pt adamantly declining need for PT/OT services as she is "independent". PT to sign off. Please re-consult if needed due to functional decline.    Salem Caster. Fairly IV, PT, DPT Physical Therapist- [facility]  10/23/2021, 1:21 PM

## 2021-10-23 NOTE — Care Management Obs Status (Signed)
[place] NOTIFICATION   Patient Details  Name: TALAJAH SLIMP MRN: [id number] Date of Birth: 06-18-1935   Medicare Observation Status Notification Given:  Yes    Candie Chroman, LCSW 10/23/2021, 9:40 AM

## 2021-10-23 NOTE — Progress Notes (Signed)
PT Cancellation Note  Patient Details Name: Hayley Knight MRN: [id number] DOB: 01-23-1936   Cancelled Treatment:    Reason Eval/Treat Not Completed: Patient declined, no reason specified. Orders received and chart reviewed. Upon entry to room pt irritable PT present for eval before attending MD has seen pt. Pt educated on role of PT in acute setting in setting of pt having PNA. Pt declining PT at this time stating she is "independent" and doesn't require our services. After education on PT's role in hospital, pt agreeable for PT to return this afternoon after pt has been "cleaned up". PT to re-attempt this afternoon as able.    Salem Caster. Fairly IV, PT, DPT Physical Therapist- [facility]  10/23/2021, 9:55 AM

## 2021-10-24 DIAGNOSIS — J810 Acute pulmonary edema: Secondary | ICD-10-CM | POA: Diagnosis not present

## 2021-10-24 DIAGNOSIS — J690 Pneumonitis due to inhalation of food and vomit: Secondary | ICD-10-CM

## 2021-10-24 DIAGNOSIS — E1122 Type 2 diabetes mellitus with diabetic chronic kidney disease: Secondary | ICD-10-CM | POA: Diagnosis not present

## 2021-10-24 DIAGNOSIS — E871 Hypo-osmolality and hyponatremia: Secondary | ICD-10-CM | POA: Diagnosis not present

## 2021-10-24 DIAGNOSIS — I2510 Atherosclerotic heart disease of native coronary artery without angina pectoris: Secondary | ICD-10-CM | POA: Diagnosis not present

## 2021-10-24 DIAGNOSIS — Z7984 Long term (current) use of oral hypoglycemic drugs: Secondary | ICD-10-CM | POA: Diagnosis not present

## 2021-10-24 DIAGNOSIS — E876 Hypokalemia: Secondary | ICD-10-CM | POA: Diagnosis not present

## 2021-10-24 DIAGNOSIS — R059 Cough, unspecified: Secondary | ICD-10-CM | POA: Diagnosis not present

## 2021-10-24 DIAGNOSIS — N179 Acute kidney failure, unspecified: Secondary | ICD-10-CM | POA: Diagnosis not present

## 2021-10-24 DIAGNOSIS — Z79899 Other long term (current) drug therapy: Secondary | ICD-10-CM | POA: Diagnosis not present

## 2021-10-24 DIAGNOSIS — R0600 Dyspnea, unspecified: Secondary | ICD-10-CM | POA: Diagnosis not present

## 2021-10-24 DIAGNOSIS — D649 Anemia, unspecified: Secondary | ICD-10-CM | POA: Diagnosis not present

## 2021-10-24 DIAGNOSIS — Z853 Personal history of malignant neoplasm of breast: Secondary | ICD-10-CM | POA: Diagnosis not present

## 2021-10-24 DIAGNOSIS — R160 Hepatomegaly, not elsewhere classified: Secondary | ICD-10-CM | POA: Diagnosis not present

## 2021-10-24 DIAGNOSIS — N1832 Chronic kidney disease, stage 3b: Secondary | ICD-10-CM | POA: Diagnosis not present

## 2021-10-24 DIAGNOSIS — Z7982 Long term (current) use of aspirin: Secondary | ICD-10-CM | POA: Diagnosis not present

## 2021-10-24 DIAGNOSIS — I129 Hypertensive chronic kidney disease with stage 1 through stage 4 chronic kidney disease, or unspecified chronic kidney disease: Secondary | ICD-10-CM | POA: Diagnosis not present

## 2021-10-24 DIAGNOSIS — D75839 Thrombocytosis, unspecified: Secondary | ICD-10-CM | POA: Diagnosis not present

## 2021-10-24 DIAGNOSIS — Z7902 Long term (current) use of antithrombotics/antiplatelets: Secondary | ICD-10-CM | POA: Diagnosis not present

## 2021-10-24 LAB — COMPREHENSIVE METABOLIC PANEL WITH GFR
ALT: 26 U/L (ref 0–44)
AST: 25 U/L (ref 15–41)
Albumin: 3.6 g/dL (ref 3.5–5.0)
Alkaline Phosphatase: 45 U/L (ref 38–126)
Anion gap: 10 (ref 5–15)
BUN: 26 mg/dL — ABNORMAL HIGH (ref 8–23)
CO2: 34 mmol/L — ABNORMAL HIGH (ref 22–32)
Calcium: 9.6 mg/dL (ref 8.9–10.3)
Chloride: 92 mmol/L — ABNORMAL LOW (ref 98–111)
Creatinine, Ser: 1.02 mg/dL — ABNORMAL HIGH (ref 0.44–1.00)
GFR, Estimated: 54 mL/min — ABNORMAL LOW
Glucose, Bld: 158 mg/dL — ABNORMAL HIGH (ref 70–99)
Potassium: 3.2 mmol/L — ABNORMAL LOW (ref 3.5–5.1)
Sodium: 136 mmol/L (ref 135–145)
Total Bilirubin: 0.5 mg/dL (ref 0.3–1.2)
Total Protein: 6.9 g/dL (ref 6.5–8.1)

## 2021-10-24 LAB — CBC
HCT: 36.4 % (ref 36.0–46.0)
Hemoglobin: 12.1 g/dL (ref 12.0–15.0)
MCH: 30 pg (ref 26.0–34.0)
MCHC: 33.2 g/dL (ref 30.0–36.0)
MCV: 90.1 fL (ref 80.0–100.0)
Platelets: 466 10*3/uL — ABNORMAL HIGH (ref 150–400)
RBC: 4.04 MIL/uL (ref 3.87–5.11)
RDW: 12.3 % (ref 11.5–15.5)
WBC: 14.7 10*3/uL — ABNORMAL HIGH (ref 4.0–10.5)
nRBC: 0 % (ref 0.0–0.2)

## 2021-10-24 LAB — GLUCOSE, CAPILLARY
Glucose-Capillary: 152 mg/dL — ABNORMAL HIGH (ref 70–99)
Glucose-Capillary: 161 mg/dL — ABNORMAL HIGH (ref 70–99)

## 2021-10-24 MED ORDER — POTASSIUM CHLORIDE CRYS ER 20 MEQ PO TBCR
40.0000 meq | EXTENDED_RELEASE_TABLET | Freq: Once | ORAL | Status: AC
  Administered 2021-10-24: 40 meq via ORAL
  Filled 2021-10-24: qty 2

## 2021-10-24 MED ORDER — AMOXICILLIN-POT CLAVULANATE 875-125 MG PO TABS: 1.0000 | ORAL_TABLET | Freq: Two times a day (BID) | ORAL | 0 refills | Status: AC

## 2021-10-24 MED ORDER — GUAIFENESIN-DM 100-10 MG/5ML PO SYRP
15.0000 mL | ORAL_SOLUTION | ORAL | Status: DC | PRN
  Administered 2021-10-24: 15 mL via ORAL
  Filled 2021-10-24: qty 20

## 2021-10-24 NOTE — TOC CM/SW Note (Signed)
Patient has orders to discharge home today. Chart reviewed. PCP is Evern Bio, NP. On room air. No wounds. No TOC needs identified. CSW signing off.  Dayton Scrape, [place]

## 2021-10-24 NOTE — Progress Notes (Signed)
PT Cancellation Note  Patient Details Name: GALINA HADDOX MRN: [id number] DOB: [birth year]   Cancelled Treatment:    Reason Eval/Treat Not Completed:  (PT evaluation attempted. Patient indep dressing self in room upon arrival; reports walking to/from bathroom indep multiple times this date. Continues to decline PT/OT, stating she is leaving soon and has HHRN/CNA services in place to assist as needed.  MD informed/aware.)  Reyes Ivan. Owens Shark, PT, DPT, NCS 10/24/21, 2:42 PM [phone]

## 2021-10-24 NOTE — Discharge Summary (Addendum)
Physician Discharge Summary  VON INSCOE MVH:[id number] DOB: [birth year] DOA: 10/21/2021  PCP: Danelle Berry, NP  Admit date: 10/21/2021 Discharge date: 10/24/2021  Admitted From: home  Disposition: home   Recommendations for Outpatient Follow-up:  Follow up with PCP in 1-2 weeks F/u w/ cardio, Dr. Clayborn Bigness, on July 20   Home Health: Pt refused to see therapy  Equipment/Devices:  Discharge Condition: stable  CODE STATUS: full  Diet recommendation: Heart Healthy / Carb Modified   Brief/Interim Summary: HPI was taken from Dr. Florina Ou: Hayley Knight is a 86 y.o. female with medical history significant of HTN, DM II coming for cough and sob. No chest pain or palpitation. Decreased appetite due to dental issues I suspect.  Patient is having bilateral rib pain with coughing but no chest pain no chest pressure no left arm pain no neck pain. Review of Systems  Respiratory:  Positive for cough and shortness of breath.   All other systems reviewed and are negative.  As per Dr. Jimmye Norman 6/28-6/30/23: Pt presented w/ shortness of breath likely secondary to pulmonary edema and possible aspiration pneumonia. Pt was treated w/ IV lasix and IV unasyn. Pt responded well to below stated treatment. Cardio did see the pt but no further inpatient cardiac work-up was indicated. Of note, pt refused to work with therapy while inpatient.   Discharge Diagnoses:  Principal Problem:   Cough Active Problems:   Dyspnea   Anxiety   Diabetes mellitus type 2, uncomplicated (HCC)   CAD (coronary artery disease)   Acute renal failure superimposed on stage 3 chronic kidney disease (HCC)   Hyponatremia   Benign essential hypertension   SOB (shortness of breath)   Anemia   Risk for falls  Dyspnea: likely secondary to acute pulmonary edema as per CXR. Continue on lasix. Monitor I/Os   Possible aspiration pneumonia: continue on IV unasyn, bronchodilators, mucinex. Encourage incentive spirometry.  D/c home on po augmentin to complete the course   Anxiety: severity unknown. Continue on home dose of diazepam    DM2: HbA1c 6.3, well controlled. Continue on SSI w/ accuchecks  Hx of CAD: not on BB secondary to bradycardia as per cardio. No further inpatient cardiac work-up needed as per cardio   AKI on CKDIIIb: Cr is trending down. Avoid nephrotoxic meds     Hyponatremia: resolved   Hypokalemia: potassium given   Normocytic anemia: H&H are stable. No need for a transfusion currently   Right hepatic lobe mass: incidental finding on CT. Will need f/u imaging w/ CT or MRI w/ contrast but will hold off for now secondary to AKI on CKD    Thrombocytosis: etiology unclear, labile. Will continue to monitor   Obesity: BMI 38.5. Complicates overall care & prognosis   Discharge Instructions  Discharge Instructions     Diet - low sodium heart healthy   Complete by: As directed    Discharge instructions   Complete by: As directed    F/u w/ PCP in 1-2 weeks. F/u w/ cardio, Dr. Clayborn Bigness, on July 20   Discharge instructions   Complete by: As directed    Right hepatic lobe mass: incidental finding on CT. Will need f/u imaging w/ CT or MRI w/ contrast and your PCP can order this imaging test for you   Increase activity slowly   Complete by: As directed       Allergies as of 10/24/2021       Reactions   Ivp Dye [iodinated Contrast Media] Swelling  Sulfa Antibiotics Swelling        Medication List     STOP taking these medications    hydrochlorothiazide 12.5 MG tablet Commonly known as: HYDRODIURIL   mirabegron ER 25 MG Tb24 tablet Commonly known as: MYRBETRIQ   naproxen sodium 220 MG tablet Commonly known as: ALEVE   ondansetron 4 MG tablet Commonly known as: ZOFRAN   ondansetron 4 MG/2ML Soln injection Commonly known as: ZOFRAN       TAKE these medications    amLODipine 2.5 MG tablet Commonly known as: NORVASC Take 2.5 mg by mouth daily.    amoxicillin-clavulanate 875-125 MG tablet Commonly known as: AUGMENTIN Take 1 tablet by mouth 2 (two) times daily for 5 days.   aspirin EC 81 MG tablet Take 81 mg by mouth at bedtime.   clopidogrel 75 MG tablet Commonly known as: PLAVIX Take 75 mg by mouth daily.   dexlansoprazole 60 MG capsule Commonly known as: DEXILANT Take 60 mg by mouth daily.   diazepam 5 MG tablet Commonly known as: VALIUM Take 5 mg by mouth every 12 (twelve) hours as needed for anxiety.   escitalopram 20 MG tablet Commonly known as: LEXAPRO Take 20 mg by mouth daily.   famotidine 20 MG tablet Commonly known as: PEPCID Take 20 mg by mouth 2 (two) times daily.   fenofibrate 145 MG tablet Commonly known as: TRICOR Take 145 mg by mouth at bedtime.   fluticasone 0.05 % cream Commonly known as: CUTIVATE Apply 1 application. topically daily as needed for rash.   furosemide 40 MG tablet Commonly known as: LASIX Take 40 mg by mouth daily.   gabapentin 300 MG capsule Commonly known as: NEURONTIN Take 300 mg by mouth at bedtime.   losartan 100 MG tablet Commonly known as: COZAAR Take 100 mg by mouth daily.   Melatonin 10 MG Tabs Take 10 mg by mouth at bedtime.   multivitamin capsule Take 2 capsules by mouth daily.   nitroGLYCERIN 2 % ointment Commonly known as: NITROGLYN Apply 0.5 inches topically every 6 (six) hours.   nitroGLYCERIN 0.4 MG SL tablet Commonly known as: NITROSTAT Place under the tongue.   omega-3 acid ethyl esters 1 g capsule Commonly known as: LOVAZA Take 2 g by mouth 2 (two) times daily.   ONE A DAY IMMUNITY DEFENSE PO Take 2 tablets by mouth daily.   oxyCODONE 10 mg 12 hr tablet Commonly known as: OXYCONTIN Take 10-20 mg by mouth See admin instructions. Take 20 mg in the morning, 10 mg in the afternoon, and 10 mg at bedtime   potassium chloride 10 MEQ tablet Commonly known as: KLOR-CON Take 10 mEq by mouth daily.   predniSONE 5 MG tablet Commonly known as:  DELTASONE Take 5 mg by mouth in the morning and at bedtime.   rosuvastatin 20 MG tablet Commonly known as: CRESTOR Take 1 tablet by mouth at bedtime.   sitaGLIPtin 100 MG tablet Commonly known as: JANUVIA Take 100 mg by mouth daily.        Follow-up Information     Callwood, Dwayne D, MD. Go in 20 day(s).   Specialties: Cardiology, Internal Medicine Why: Has appt scheduled for 11/13/21 Contact information: [place] [address] [phone]         Danelle Berry, NP Follow up in 1 week(s).   Specialty: Nurse Practitioner Contact information: [address] [place] [address] [phone]  Allergies  Allergen Reactions   Ivp Dye [Iodinated Contrast Media] Swelling   Sulfa Antibiotics Swelling    Consultations: Cardio    Procedures/Studies: ECHOCARDIOGRAM COMPLETE  Result Date: 10/22/2021    ECHOCARDIOGRAM REPORT   Patient Name:   Hayley Knight Date of Exam: 10/22/2021 Medical Rec #:  [id number]         Height:       59.0 in Accession #:    [id number]        Weight:       196.2 lb Date of Birth:  1935/06/23          BSA:          1.829 m Patient Age:    87 years          BP:           122/59 mmHg Patient Gender: F                 HR:           53 bpm. Exam Location:  [facility] Procedure: 2D Echo, Cardiac Doppler and Color Doppler Indications:     Shortness of breath  History:         Patient has no prior history of Echocardiogram examinations.                  Risk Factors:Hypertension and Diabetes.  Sonographer:     Sherrie Sport Referring Phys:  Whitney Diagnosing Phys: Neoma Laming  Sonographer Comments: Suboptimal apical window. IMPRESSIONS  1. Left ventricular ejection fraction, by estimation, is 60 to 65%. The left ventricle has normal function. The left ventricle has no regional wall motion abnormalities. There is moderate concentric left ventricular hypertrophy. Left ventricular diastolic parameters are  consistent with Grade III diastolic dysfunction (restrictive).  2. Right ventricular systolic function is normal. The right ventricular size is normal.  3. Left atrial size was moderately dilated.  4. Right atrial size was moderately dilated.  5. The mitral valve is normal in structure. No evidence of mitral valve regurgitation. No evidence of mitral stenosis.  6. The aortic valve is normal in structure. Aortic valve regurgitation is not visualized. Aortic valve sclerosis is present, with no evidence of aortic valve stenosis.  7. The inferior vena cava is normal in size with greater than 50% respiratory variability, suggesting right atrial pressure of 3 mmHg. FINDINGS  Left Ventricle: Left ventricular ejection fraction, by estimation, is 60 to 65%. The left ventricle has normal function. The left ventricle has no regional wall motion abnormalities. The left ventricular internal cavity size was normal in size. There is  moderate concentric left ventricular hypertrophy. Left ventricular diastolic parameters are consistent with Grade III diastolic dysfunction (restrictive). Right Ventricle: The right ventricular size is normal. No increase in right ventricular wall thickness. Right ventricular systolic function is normal. Left Atrium: Left atrial size was moderately dilated. Right Atrium: Right atrial size was moderately dilated. Pericardium: There is no evidence of pericardial effusion. Mitral Valve: The mitral valve is normal in structure. No evidence of mitral valve regurgitation. No evidence of mitral valve stenosis. Tricuspid Valve: The tricuspid valve is normal in structure. Tricuspid valve regurgitation is trivial. No evidence of tricuspid stenosis. Aortic Valve: The aortic valve is normal in structure. Aortic valve regurgitation is not visualized. Aortic valve sclerosis is present, with no evidence of aortic valve stenosis. Aortic valve mean gradient measures 4.0 mmHg. Aortic valve peak gradient measures 8.1  mmHg. Aortic  valve area, by VTI measures 2.20 cm. Pulmonic Valve: The pulmonic valve was normal in structure. Pulmonic valve regurgitation is not visualized. No evidence of pulmonic stenosis. Aorta: The aortic root is normal in size and structure. Venous: The inferior vena cava is normal in size with greater than 50% respiratory variability, suggesting right atrial pressure of 3 mmHg. IAS/Shunts: No atrial level shunt detected by color flow Doppler.  LEFT VENTRICLE PLAX 2D LVOT diam:     2.00 cm   Diastology LV SV:         66        LV e' medial:    5.11 cm/s LV SV Index:   36        LV E/e' medial:  18.5 LVOT Area:     3.14 cm  LV e' lateral:   4.13 cm/s                          LV E/e' lateral: 22.9  RIGHT VENTRICLE RV Basal diam:  3.00 cm RV S prime:     11.40 cm/s TAPSE (M-mode): 1.8 cm LEFT ATRIUM             Index        RIGHT ATRIUM           Index LA diam:        3.30 cm 1.80 cm/m   RA Area:     17.80 cm LA Vol (A2C):   80.7 ml 44.11 ml/m  RA Volume:   51.20 ml  27.99 ml/m LA Vol (A4C):   46.6 ml 25.47 ml/m LA Biplane Vol: 62.1 ml 33.95 ml/m  AORTIC VALVE AV Area (Vmax):    2.23 cm AV Area (Vmean):   2.06 cm AV Area (VTI):     2.20 cm AV Vmax:           142.00 cm/s AV Vmean:          88.500 cm/s AV VTI:            0.298 m AV Peak Grad:      8.1 mmHg AV Mean Grad:      4.0 mmHg LVOT Vmax:         101.00 cm/s LVOT Vmean:        57.900 cm/s LVOT VTI:          0.209 m LVOT/AV VTI ratio: 0.70  AORTA Ao Root diam: 2.70 cm MITRAL VALVE                TRICUSPID VALVE MV Area (PHT): 2.97 cm     TR Peak grad:   30.0 mmHg MV Decel Time: 255 msec     TR Vmax:        274.00 cm/s MV E velocity: 94.70 cm/s MV A velocity: 102.00 cm/s  SHUNTS MV E/A ratio:  0.93         Systemic VTI:  0.21 m                             Systemic Diam: 2.00 cm Neoma Laming Electronically signed by Neoma Laming Signature Date/Time: 10/22/2021/12:17:39 PM    Final    CT Chest Wo Contrast  Result Date: 10/21/2021 CLINICAL DATA:   Pneumonia, complication suspected, xray done. Cough, dyspnea. EXAM: CT CHEST WITHOUT CONTRAST TECHNIQUE: Multidetector CT imaging of the chest was performed following the standard protocol without IV contrast.  RADIATION DOSE REDUCTION: This exam was performed according to the departmental dose-optimization program which includes automated exposure control, adjustment of the mA and/or kV according to patient size and/or use of iterative reconstruction technique. COMPARISON:  06/20/2021 FINDINGS: Cardiovascular: Moderate coronary artery calcification. Interval right coronary artery stenting. Global cardiac size within normal limits. No pericardial effusion. Central pulmonary arteries are of normal caliber. Mild atherosclerotic calcification within the thoracic aorta. No aortic aneurysm. Mediastinum/Nodes: No enlarged mediastinal or axillary lymph nodes. Thyroid gland, trachea, and esophagus demonstrate no significant findings. Lungs/Pleura: Lungs are clear. No pneumothorax or pleural effusion. Mild debris is seen within multiple lobar and segmental bronchi within the lung bases bilaterally. No central obstructing mass lesion. Upper Abdomen: Hypodense mass within the posterior right hepatic lobe is incompletely included on this examination but demonstrates interval increase in size since remote prior examination of 03/17/2015 and demonstrates increasing central calcification. This is not well characterized on this examination. No acute abnormality identified. Musculoskeletal: No acute bone abnormality. Osseous structures are age-appropriate. 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 IMPRESSION: 1. No acute intrathoracic pathology identified. 2. Moderate coronary artery calcification. Interval right coronary artery stenting. 3. Debris within multiple lobar and segmental bronchi within the lung bases bilaterally. No central obstructing mass lesion identified. This may reflect changes of aspiration or mucous  plugging. No focal pulmonary infiltrate. 4. Interval enlargement of a right hepatic lobe mass, incompletely characterized on this examination. This could be better assessed with dedicated contrast enhanced CT or MRI examination, if indicated. Electronically Signed   By: Fidela Salisbury M.D.   On: 10/21/2021 21:46   DG Chest 2 View  Result Date: 10/21/2021 CLINICAL DATA:  cough EXAM: CHEST - 2 VIEW COMPARISON:  Chest x-ray 03/16/2015. FINDINGS: The heart and mediastinal contours are unchanged. No focal consolidation. Slightly increased central markings. Trace left pleural effusion. No right pleural effusion. No pneumothorax. No acute osseous abnormality. IMPRESSION: 1. Likely mild pulmonary edema. 2. Trace left pleural effusion. Electronically Signed   By: Iven Finn M.D.   On: 10/21/2021 19:58   (Echo, Carotid, EGD, Colonoscopy, ERCP)    Subjective: Pt c/o fatigue    Discharge Exam: Vitals:   10/24/21 0856 10/24/21 1244  BP: (!) 150/67 (!) 143/69  Pulse: 73 71  Resp: 14 18  Temp: (!) 97.5 F (36.4 C) 98.3 F (36.8 C)  SpO2: 97% 95%   Vitals:   10/23/21 2325 10/24/21 0304 10/24/21 0856 10/24/21 1244  BP: 137/62 (!) 138/58 (!) 150/67 (!) 143/69  Pulse: 65 90 73 71  Resp: '18  14 18  '$ Temp: 98.3 F (36.8 C) 97.9 F (36.6 C) (!) 97.5 F (36.4 C) 98.3 F (36.8 C)  TempSrc: Oral Oral    SpO2: 94%  97% 95%  Weight:      Height:        General: Pt is alert, awake, not in acute distress Cardiovascular: S1/S2 +, no rubs, no gallops Respiratory: diminished breath sounds b/l  Abdominal: Soft, NT, obese, bowel sounds + Extremities: no cyanosis    The results of significant diagnostics from this hospitalization (including imaging, microbiology, ancillary and laboratory) are listed below for reference.     Microbiology: Recent Results (from the past 240 hour(s))  Urine Culture     Status: Abnormal   Collection Time: 10/22/21 11:32 AM   Specimen: Urine, Clean Catch  Result  Value Ref Range Status   Specimen Description   Final    URINE, CLEAN CATCH Performed at [facility],  [place], [place] [address]    Special Requests   Final    NONE Performed at [facility], [address] [place], [place] [address]    Culture (A)  Final    <10,000 COLONIES/mL INSIGNIFICANT GROWTH Performed at [place] [address] [place], [place] [address]    Report Status 10/23/2021 FINAL  Final     Labs: BNP (last 3 results) Recent Labs    05/07/21 1626 06/25/21 1540 10/21/21 1928  BNP 63.5 15.0 599.3*   Basic Metabolic Panel: Recent Labs  Lab 10/21/21 1928 10/22/21 0739 10/23/21 0435 10/24/21 0625  NA 123* 129* 134* 136  K 3.8 3.0* 3.7 3.2*  CL 80* 87* 92* 92*  CO2 '28 30 31 '$ 34*  GLUCOSE 206* 102* 171* 158*  BUN 30* 24* 25* 26*  CREATININE 1.64* 1.31* 1.19* 1.02*  CALCIUM 9.1 8.9 8.9 9.6   Liver Function Tests: Recent Labs  Lab 10/24/21 0625  AST 25  ALT 26  ALKPHOS 45  BILITOT 0.5  PROT 6.9  ALBUMIN 3.6   No results for input(s): "LIPASE", "AMYLASE" in the last 168 hours. No results for input(s): "AMMONIA" in the last 168 hours. CBC: Recent Labs  Lab 10/21/21 1928 10/22/21 0739 10/23/21 0435 10/24/21 0625  WBC 12.4* 10.7* 9.8 14.7*  HGB 11.4* 10.7* 11.3* 12.1  HCT 33.5* 31.2* 33.2* 36.4  MCV 87.9 87.9 88.5 90.1  PLT 451* 447* 434* 466*   Cardiac Enzymes: No results for input(s): "CKTOTAL", "CKMB", "CKMBINDEX", "TROPONINI" in the last 168 hours. BNP: Invalid input(s): "POCBNP" CBG: Recent Labs  Lab 10/23/21 1207 10/23/21 1600 10/23/21 07-13-98 10/24/21 0838 10/24/21 1241  GLUCAP 198* 213* 237* 152* 161*   D-Dimer No results for input(s): "DDIMER" in the last 72 hours. Hgb A1c Recent Labs    10/22/21 0739  HGBA1C 6.2*   Lipid Profile Recent Labs    10/22/21 0739  CHOL 87  HDL 40*  LDLCALC 17  TRIG 150*  CHOLHDL 2.2   Thyroid function studies Recent Labs     10/21/21 July 13, 2130  TSH 0.297*   Anemia work up No results for input(s): "VITAMINB12", "FOLATE", "FERRITIN", "TIBC", "IRON", "RETICCTPCT" in the last 72 hours. Urinalysis    Component Value Date/Time   COLORURINE STRAW (A) 10/21/2021 13-Jul-2150   APPEARANCEUR CLEAR (A) 10/21/2021 Jul 13, 2150   APPEARANCEUR Clear 08/25/2021 1347   LABSPEC 1.006 10/21/2021 13-Jul-2150   LABSPEC 1.015 06/02/2014 1526   PHURINE 6.0 10/21/2021 2152   GLUCOSEU NEGATIVE 10/21/2021 07/13/50   GLUCOSEU Negative 06/02/2014 1526   HGBUR MODERATE (A) 10/21/2021 July 13, 2150   BILIRUBINUR NEGATIVE 10/21/2021 13-Jul-2150   BILIRUBINUR Negative 08/25/2021 1347   BILIRUBINUR Negative 06/02/2014 [place] 10/21/2021 07-13-50   PROTEINUR NEGATIVE 10/21/2021 2150/07/13   NITRITE NEGATIVE 10/21/2021 2150-07-13   LEUKOCYTESUR NEGATIVE 10/21/2021 07/13/50   LEUKOCYTESUR Negative 06/02/2014 1526   Sepsis Labs Recent Labs  Lab 10/21/21 1928 10/22/21 0739 10/23/21 0435 10/24/21 0625  WBC 12.4* 10.7* 9.8 14.7*   Microbiology Recent Results (from the past 240 hour(s))  Urine Culture     Status: Abnormal   Collection Time: 10/22/21 11:32 AM   Specimen: Urine, Clean Catch  Result Value Ref Range Status   Specimen Description   Final    URINE, CLEAN CATCH Performed at [facility], [address], [place], [place] [address]    Special Requests   Final    NONE Performed at [facility], [place],  [place], [place] [address]    Culture (A)  Final    <10,000 COLONIES/mL INSIGNIFICANT GROWTH Performed at [place] [address] [place], [place] [address]    Report Status 10/23/2021 FINAL  Final     Time coordinating discharge: Over 30 minutes  SIGNED:   Wyvonnia Dusky, MD  [facility] 10/24/2021, 1:01 PM Pager   If 7PM-7AM, please contact night-coverage [email or web address]

## 2021-10-30 ENCOUNTER — Ambulatory Visit (HOSPITAL_COMMUNITY): Admission: RE | Admit: 2021-10-30 | Payer: Medicare Other | Source: Home / Self Care | Admitting: Oral Surgery

## 2021-10-30 ENCOUNTER — Encounter (HOSPITAL_COMMUNITY): Admission: RE | Payer: Self-pay | Source: Home / Self Care

## 2021-10-30 SURGERY — DENTAL RESTORATION/EXTRACTIONS
Anesthesia: General

## 2021-10-31 DIAGNOSIS — F32A Depression, unspecified: Secondary | ICD-10-CM | POA: Diagnosis not present

## 2021-10-31 DIAGNOSIS — D509 Iron deficiency anemia, unspecified: Secondary | ICD-10-CM | POA: Diagnosis not present

## 2021-10-31 DIAGNOSIS — R7303 Prediabetes: Secondary | ICD-10-CM | POA: Diagnosis not present

## 2021-10-31 DIAGNOSIS — E785 Hyperlipidemia, unspecified: Secondary | ICD-10-CM | POA: Diagnosis not present

## 2021-11-07 ENCOUNTER — Ambulatory Visit
Admission: RE | Admit: 2021-11-07 | Discharge: 2021-11-07 | Disposition: A | Discharge status: Home / Self Care | Payer: Medicare Other | Source: Ambulatory Visit | Attending: Nurse Practitioner | Admitting: Nurse Practitioner

## 2021-11-07 ENCOUNTER — Other Ambulatory Visit: Payer: Self-pay | Admitting: Nurse Practitioner

## 2021-11-07 ENCOUNTER — Ambulatory Visit
Admission: RE | Admit: 2021-11-07 | Discharge: 2021-11-07 | Disposition: A | Discharge status: Home / Self Care | Payer: Medicare Other | Attending: Nurse Practitioner | Admitting: Nurse Practitioner

## 2021-11-07 DIAGNOSIS — J159 Unspecified bacterial pneumonia: Secondary | ICD-10-CM

## 2021-11-07 DIAGNOSIS — J90 Pleural effusion, not elsewhere classified: Secondary | ICD-10-CM | POA: Diagnosis not present

## 2021-11-07 DIAGNOSIS — R918 Other nonspecific abnormal finding of lung field: Secondary | ICD-10-CM | POA: Diagnosis not present

## 2021-11-13 DIAGNOSIS — I25110 Atherosclerotic heart disease of native coronary artery with unstable angina pectoris: Secondary | ICD-10-CM | POA: Diagnosis not present

## 2021-11-13 DIAGNOSIS — R0602 Shortness of breath: Secondary | ICD-10-CM | POA: Diagnosis not present

## 2021-11-13 DIAGNOSIS — I208 Other forms of angina pectoris: Secondary | ICD-10-CM | POA: Diagnosis not present

## 2021-11-13 DIAGNOSIS — Z955 Presence of coronary angioplasty implant and graft: Secondary | ICD-10-CM | POA: Diagnosis not present

## 2021-11-17 DIAGNOSIS — I2510 Atherosclerotic heart disease of native coronary artery without angina pectoris: Secondary | ICD-10-CM | POA: Diagnosis not present

## 2021-11-17 DIAGNOSIS — R7303 Prediabetes: Secondary | ICD-10-CM | POA: Diagnosis not present

## 2021-11-17 DIAGNOSIS — I10 Essential (primary) hypertension: Secondary | ICD-10-CM | POA: Diagnosis not present

## 2021-11-17 DIAGNOSIS — N189 Chronic kidney disease, unspecified: Secondary | ICD-10-CM | POA: Diagnosis not present

## 2021-11-19 DIAGNOSIS — M25511 Pain in right shoulder: Secondary | ICD-10-CM | POA: Diagnosis not present

## 2021-11-19 DIAGNOSIS — R52 Pain, unspecified: Secondary | ICD-10-CM | POA: Diagnosis not present

## 2021-11-19 DIAGNOSIS — M25521 Pain in right elbow: Secondary | ICD-10-CM | POA: Diagnosis not present

## 2021-11-19 DIAGNOSIS — M542 Cervicalgia: Secondary | ICD-10-CM | POA: Diagnosis not present

## 2021-11-19 DIAGNOSIS — E1165 Type 2 diabetes mellitus with hyperglycemia: Secondary | ICD-10-CM | POA: Diagnosis not present

## 2021-11-19 DIAGNOSIS — M545 Low back pain, unspecified: Secondary | ICD-10-CM | POA: Diagnosis not present

## 2021-11-24 ENCOUNTER — Ambulatory Visit: Payer: Medicare Other | Admitting: Urology

## 2021-11-24 VITALS — BP 132/75 | HR 84 | Ht 62.0 in | Wt 193.0 lb

## 2021-11-24 DIAGNOSIS — R32 Unspecified urinary incontinence: Secondary | ICD-10-CM | POA: Diagnosis not present

## 2021-11-24 DIAGNOSIS — N3946 Mixed incontinence: Secondary | ICD-10-CM

## 2021-11-24 LAB — URINALYSIS, COMPLETE
Bilirubin, UA: NEGATIVE
Glucose, UA: NEGATIVE
Ketones, UA: NEGATIVE
Leukocytes,UA: NEGATIVE
Nitrite, UA: NEGATIVE
Protein,UA: NEGATIVE
RBC, UA: NEGATIVE
Specific Gravity, UA: 1.01 (ref 1.005–1.030)
Urobilinogen, Ur: 0.2 mg/dL (ref 0.2–1.0)
pH, UA: 6.5 (ref 5.0–7.5)

## 2021-11-24 LAB — MICROSCOPIC EXAMINATION: Bacteria, UA: NONE SEEN

## 2021-11-24 MED ORDER — GEMTESA 75 MG PO TABS: 75.0000 mg | ORAL_TABLET | Freq: Every day | ORAL | 0 refills | Status: DC

## 2021-11-24 MED ORDER — GEMTESA 75 MG PO TABS: 75.0000 mg | ORAL_TABLET | Freq: Every day | ORAL | 11 refills | Status: DC

## 2021-11-24 NOTE — Progress Notes (Signed)
11/24/2021 1:01 PM   UNNAMED HINO 06-17-1935 [id number]  Referring provider: Danelle Berry, NP [address] [place],  [place] [address]  Chief Complaint  Patient presents with   Cysto    HPI: I was consulted to assess the patient's prolapse worsening over 1 month.  She can feel vaginal bulging.  She has had a hysterectomy   At baseline she leaks with coughing sneezing and sometimes with bending and lifting.  She has urge incontinence.  She wears usually 1 pad a day.  She denies bedwetting   She voids every 1 or 2 hours and has no nocturia.  Flow varies.  5 minutes later she can double void and increased amount.   High small grade 2 cystocele and mild smaller grade 2 distal rectocele.  Exam limited by obesity   She has prolapse symptoms of mild mixed incontinence.  She has frequency and nocturia.  The patient says she can feel something in the vagina but also thinks there is pressure in the left groin.  A picture was drawn.  She really did not have much prolapse and is not bothersome.  If she ever had surgery I would need to order urodynamics and reexamine her and study the size of the cystocele fluoroscopically.  Watchful waiting at this stage I think is the good option for her.  There was virtually no cystocele clinically today and again the rectocele was not that impressive either.   Today I have not seen the patient for approximately 3 years.  She saw my partner a few weeks ago.  She had chronic renal insufficiency with a GFR of 35.  It looks like the ultrasound was never done but may be pending Aug 27, 2021.  Renal ultrasound May 2023 normal   Her main complaint today is high-volume bedwetting.  The pad is soaked.  She wears 1 or 2 during the day that are damp for urge incontinence and mild stress incontinence.  Voids approximately every 1 hour.  Clinically not infected    Reassess patient in 5 or 6 weeks on Myrbetriq 25 mg samples and prescription due to renal  insufficiency.  Call if culture positive.  Perform cystoscopy on that day.  Today Frequency stable.  Culture negative Patient again having minimal incontinence during the day.  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  No prolapse. Cystoscopy: Patient underwent flexible cystoscopy.  Bladder close and trigone were normal.  No cystitis.  No carcinoma.     PMH: Past Medical History:  Diagnosis Date   Arthritis    Diabetes mellitus without complication ([place])    Hypertension     Surgical History: Past Surgical History:  Procedure Laterality Date   ABDOMINAL HYSTERECTOMY     APPENDECTOMY     CHOLECYSTECTOMY     MASTECTOMY Right    REPLACEMENT TOTAL KNEE BILATERAL     RIGHT/LEFT HEART CATH AND CORONARY ANGIOGRAPHY Bilateral 07/09/2021   Procedure: RIGHT/LEFT HEART CATH AND CORONARY ANGIOGRAPHY;  Surgeon: Yolonda Kida, MD;  Location: [facility];  Service: Cardiovascular;  Laterality: Bilateral;    Home Medications:  Allergies as of 11/24/2021       Reactions   Ivp Dye [iodinated Contrast Media] Swelling   Sulfa Antibiotics Swelling        Medication List        Accurate as of November 24, 2021  1:01 PM. If you have  any questions, ask your nurse or doctor.          amLODipine 2.5 MG tablet Commonly known as: NORVASC Take 2.5 mg by mouth daily.   aspirin EC 81 MG tablet Take 81 mg by mouth at bedtime.   clopidogrel 75 MG tablet Commonly known as: PLAVIX Take 75 mg by mouth daily.   dexlansoprazole 60 MG capsule Commonly known as: DEXILANT Take 60 mg by mouth daily.   diazepam 5 MG tablet Commonly known as: VALIUM Take 5 mg by mouth every 12 (twelve) hours as needed for anxiety.   escitalopram 20 MG tablet Commonly known as: LEXAPRO Take 20 mg by mouth daily.   famotidine 20 MG tablet Commonly known as: PEPCID Take 20 mg by mouth 2 (two) times daily.   fenofibrate 145 MG  tablet Commonly known as: TRICOR Take 145 mg by mouth at bedtime.   fluticasone 0.05 % cream Commonly known as: CUTIVATE Apply 1 application. topically daily as needed for rash.   furosemide 40 MG tablet Commonly known as: LASIX Take 40 mg by mouth daily.   gabapentin 300 MG capsule Commonly known as: NEURONTIN Take 300 mg by mouth at bedtime.   losartan 100 MG tablet Commonly known as: COZAAR Take 100 mg by mouth daily.   Melatonin 10 MG Tabs Take 10 mg by mouth at bedtime.   multivitamin capsule Take 2 capsules by mouth daily.   nitroGLYCERIN 2 % ointment Commonly known as: NITROGLYN Apply 0.5 inches topically every 6 (six) hours.   nitroGLYCERIN 0.4 MG SL tablet Commonly known as: NITROSTAT Place under the tongue.   omega-3 acid ethyl esters 1 g capsule Commonly known as: LOVAZA Take 2 g by mouth 2 (two) times daily.   ONE A DAY IMMUNITY DEFENSE PO Take 2 tablets by mouth daily.   oxyCODONE 10 mg 12 hr tablet Commonly known as: OXYCONTIN Take 10-20 mg by mouth See admin instructions. Take 20 mg in the morning, 10 mg in the afternoon, and 10 mg at bedtime   potassium chloride 10 MEQ tablet Commonly known as: KLOR-CON Take 10 mEq by mouth daily.   predniSONE 5 MG tablet Commonly known as: DELTASONE Take 5 mg by mouth in the morning and at bedtime.   rosuvastatin 20 MG tablet Commonly known as: CRESTOR Take 1 tablet by mouth at bedtime.   sitaGLIPtin 100 MG tablet Commonly known as: JANUVIA Take 100 mg by mouth daily.        Allergies:  Allergies  Allergen Reactions   Ivp Dye [Iodinated Contrast Media] Swelling   Sulfa Antibiotics Swelling    Family History: No family history on file.  Social History:  reports that she has never smoked. She has never used smokeless tobacco. She reports that she does not drink alcohol and does not use drugs.  ROS:                                        Physical Exam: There were no  vitals taken for this visit.  Constitutional:  Alert and oriented, No acute distress. HEENT: [place] AT, moist mucus membranes.  Trachea midline, no masses.   Laboratory Data: Lab Results  Component Value Date   WBC 14.7 (H) 10/24/2021   HGB 12.1 10/24/2021   HCT 36.4 10/24/2021   MCV 90.1 10/24/2021   PLT 466 (H) 10/24/2021    Lab Results  Component  Value Date   CREATININE 1.02 (H) 10/24/2021    No results found for: "PSA"  No results found for: "TESTOSTERONE"  Lab Results  Component Value Date   HGBA1C 6.2 (H) 10/22/2021    Urinalysis    Component Value Date/Time   COLORURINE STRAW (A) 10/21/2021 2152   APPEARANCEUR CLEAR (A) 10/21/2021 2152   APPEARANCEUR Clear 08/25/2021 1347   LABSPEC 1.006 10/21/2021 2152   LABSPEC 1.015 06/02/2014 1526   PHURINE 6.0 10/21/2021 2152   GLUCOSEU NEGATIVE 10/21/2021 2152   GLUCOSEU Negative 06/02/2014 1526   HGBUR MODERATE (A) 10/21/2021 2152   BILIRUBINUR NEGATIVE 10/21/2021 2152   BILIRUBINUR Negative 08/25/2021 1347   BILIRUBINUR Negative 06/02/2014 [place] 10/21/2021 2152   PROTEINUR NEGATIVE 10/21/2021 2152   NITRITE NEGATIVE 10/21/2021 2152   LEUKOCYTESUR NEGATIVE 10/21/2021 2152   LEUKOCYTESUR Negative 06/02/2014 1526    Pertinent Imaging:   Assessment & Plan:  Reassess in 6 weeks on Gemtesa samples and prescription.  Antimuscarinics and percutaneous tibial nerve stimulation may be a good option.  He might be a candidate for InterStim or Botox.  She pulls up on her abdomen when she voids but has no anatomic issues or prolapse on vaginal examination and this was discussed  1. Urinary incontinence, unspecified type  - Urinalysis, Complete   No follow-ups on file.  Reece Packer, MD  [facility] [address], [place], [place] [address] [phone]

## 2021-11-24 NOTE — Addendum Note (Signed)
Addended by: Evelina Bucy on: 11/24/2021 01:47 PM   Modules accepted: Orders

## 2021-11-28 DIAGNOSIS — Z79899 Other long term (current) drug therapy: Secondary | ICD-10-CM | POA: Diagnosis not present

## 2021-12-15 DIAGNOSIS — E785 Hyperlipidemia, unspecified: Secondary | ICD-10-CM | POA: Diagnosis not present

## 2021-12-15 DIAGNOSIS — I10 Essential (primary) hypertension: Secondary | ICD-10-CM | POA: Diagnosis not present

## 2021-12-15 DIAGNOSIS — R7303 Prediabetes: Secondary | ICD-10-CM | POA: Diagnosis not present

## 2021-12-24 NOTE — H&P (Signed)
  Patient: Hayley Knight  PID: [id number]  DOB: Apr 28, 1935  SEX: Female   Patient referred by DDS for extraction  all remaining teeth  CC: Some pain off and on.  HPI: Pt scheduled for surgery 10/23/2021 but cancelled due to admission for acute pulmonary edema.  Past Medical History:  High Blood Pressure, Hay Fever or Sinus Problems, gallbladder removed, Diabetes, Kidney Trouble, Breast Cancer, Morbid Obesity, RBBB    Medications: Plavix, Oxycontin, Gabapentin, D3, Lexapro, Lasix, Losartan, Dexilant, Valium, HCTZ, Amlodipine, Aspirin, Pepcid, Januvia, NTG, Potassium, Crestor    Allergies:     Iodine, Sulfa    Surgeries:   Stent placed 06/29/2021, Hysterectomy, Mastectomy, gallbladder removal     Social History       Smoking:            Alcohol: Drug use:                             Exam: BMI 40. Decay, perio disease all remaining teeth # 2, 3, 6, 7, 8, 9, 10, 11, 20, 21, 22, 23, 24, 25, 26, 27, 28, 29. Bilateral mandibular lingual tori.  No purulence, edema, fluctuance, trismus. Oral cancer screening negative. Pharynx clear. No lymphadenopathy.  Panorex: Decay, perio disease all remaining teeth # 2, 3, 6, 7, 8, 9, 10, 11, 20, 21, 22, 23, 24, 25, 26, 27, 28, 29.  Assessment: ASA 3. Non-restorable teeth # 2, 3, 6, 7, 8, 9, 10, 11, 20, 21, 22, 23, 24, 25, 26, 27, 28, 29. Bilateral mandibular lingual tori.               Plan: 1. MD Clearance  2. Extraction Teeth # 2, 3, 6, 7, 8, 9, 10, 11, 20, 21, 22, 23, 24, 25, 26, 27, 28, 29. Alveoloplasty. Removal lingual tori.  Hospital Day surgery.                 Rx: n               Risks and complications explained. Questions answered.   Gae Bon, DMD

## 2021-12-31 ENCOUNTER — Other Ambulatory Visit: Payer: Self-pay

## 2021-12-31 ENCOUNTER — Encounter (HOSPITAL_COMMUNITY): Payer: Self-pay | Admitting: Oral Surgery

## 2021-12-31 NOTE — Progress Notes (Addendum)
PCP - Dr. Donella Stade- [facility]  Cardiologist - Dr. Clayborn Bigness  EP- Denies  Endocrine- Denies  Pulm- Denies  Chest x-ray - 11/07/21 (E)  EKG - 10/21/21 (E)  Stress Test - Denies  ECHO - 10/22/21 (E)  Cardiac Cath - 07/09/21 (E)  AICD-na PM-na LOOP-na  Nerve Stimulator- Denies  Dialysis- Denies  Sleep Study - Yes- Positive CPAP - Denies  LABS- 01/02/22: CBC, BMP  ASA-LD- 8/31 PLAVIX- LD- 8/31  ERAS- No  HA1C- 10/22/21(E): 6.2 Fasting Blood Sugar - 84-113 Checks Blood Sugar __1___ time a day  Anesthesia- No  Pt denies having chest pain, sob, or fever during the pre-op phone call. All instructions explained to the pt, with a verbal understanding of the material including: as of today, stop taking all Aspirin (unless instructed by your doctor) and Other Aspirin containing products, Vitamins, Fish oils, and Herbal medications. Also stop all NSAIDS i.e. Advil, Ibuprofen, Motrin, Aleve, Anaprox, Naproxen, BC, Goody Powders, and all Supplements.   WHAT DO I DO ABOUT MY DIABETES MEDICATION?  Do not take Januvia the morning of surgery.  The day of surgery, do not take other diabetes injectables, including Byetta (exenatide), Bydureon (exenatide ER), Victoza (liraglutide), or Trulicity (dulaglutide).  If your CBG is greater than 220 mg/dL, inform the staff upon arrival to Pre-Op.  How to Manage Your Diabetes Before and After Surgery How do I manage my blood sugar before surgery? Check your blood sugar at least 4 times a day, starting 2 days before surgery, to make sure that the level is not too high or low. Check your blood sugar the morning of your surgery when you wake up and every 2 hours until you get to the Short Stay unit. If your blood sugar is less than 70 mg/dL, you will need to treat for low blood sugar: Do not take insulin. Treat a low blood sugar (less than 70 mg/dL) with  cup of clear juice (cranberry or apple), 4 glucose tablets, OR glucose gel. Recheck  blood sugar in 15 minutes after treatment (to make sure it is greater than 70 mg/dL). If your blood sugar is not greater than 70 mg/dL on recheck, call [phone]  for further instructions. Report your blood sugar to the short stay nurse when you get to Short Stay.  If you are admitted to the hospital after surgery: Your blood sugar will be checked by the staff and you will probably be given insulin after surgery (instead of oral diabetes medicines) to make sure you have good blood sugar levels. The goal for blood sugar control after surgery is 80-180 mg/dL.  Reviewed and Endorsed by [facility] Patient Education Committee, August 2015   Pt also instructed to wear a mask and social distance if she goes out. The opportunity to ask questions was provided.

## 2022-01-01 NOTE — Anesthesia Preprocedure Evaluation (Addendum)
Anesthesia Evaluation  Patient identified by MRN, date of birth, ID band Patient awake    Reviewed: Allergy & Precautions, NPO status , Patient's Chart, lab work & pertinent test results  Airway Mallampati: III   Neck ROM: Full    Dental  (+) Missing, Poor Dentition, Chipped, Dental Advisory Given   Pulmonary shortness of breath, sleep apnea ,    Pulmonary exam normal breath sounds clear to auscultation       Cardiovascular hypertension, Pt. on medications + CAD   Rhythm:Regular Rate:Normal  Echo 09/2021 1. Left ventricular ejection fraction, by estimation, is 60 to 65%. The left ventricle has normal function. The left ventricle has no regional wall motion abnormalities. There is moderate concentric left ventricular hypertrophy. Left ventricular diastolic parameters are consistent with Grade III diastolic dysfunction (restrictive).  2. Right ventricular systolic function is normal. The right ventricular size is normal.  3. Left atrial size was moderately dilated.  4. Right atrial size was moderately dilated.  5. The mitral valve is normal in structure. No evidence of mitral valve regurgitation. No evidence of mitral stenosis.  6. The aortic valve is normal in structure. Aortic valve regurgitation is not visualized. Aortic valve sclerosis is present, with no evidence of aortic valve stenosis.  7. The inferior vena cava is normal in size with greater than 50% respiratory variability, suggesting right atrial pressure of 3 mmHg.    Neuro/Psych Anxiety  Neuromuscular disease    GI/Hepatic Neg liver ROS, GERD  ,  Endo/Other  diabetes  Renal/GU Renal disease     Musculoskeletal  (+) Arthritis , Fibromyalgia -  Abdominal   Peds  Hematology  (+) Blood dyscrasia, anemia ,   Anesthesia Other Findings   Reproductive/Obstetrics                            Anesthesia Physical Anesthesia Plan  ASA:  3  Anesthesia Plan: General   Post-op Pain Management: Minimal or no pain anticipated and Ofirmev IV (intra-op)*   Induction: Intravenous  PONV Risk Score and Plan: Ondansetron, Dexamethasone and Treatment may vary due to age or medical condition  Airway Management Planned: Nasal ETT and Video Laryngoscope Planned  Additional Equipment:   Intra-op Plan:   Post-operative Plan: Extubation in OR  Informed Consent: I have reviewed the patients History and Physical, chart, labs and discussed the procedure including the risks, benefits and alternatives for the proposed anesthesia with the patient or authorized representative who has indicated his/her understanding and acceptance.     Dental advisory given  Plan Discussed with: CRNA  Anesthesia Plan Comments:         Anesthesia Quick Evaluation

## 2022-01-02 ENCOUNTER — Ambulatory Visit (HOSPITAL_BASED_OUTPATIENT_CLINIC_OR_DEPARTMENT_OTHER): Payer: Medicare Other | Admitting: Certified Registered"

## 2022-01-02 ENCOUNTER — Other Ambulatory Visit: Payer: Self-pay

## 2022-01-02 ENCOUNTER — Ambulatory Visit (HOSPITAL_COMMUNITY)
Admission: RE | Admit: 2022-01-02 | Discharge: 2022-01-02 | Disposition: A | Discharge status: Home / Self Care | Payer: Medicare Other | Attending: Oral Surgery | Admitting: Oral Surgery

## 2022-01-02 ENCOUNTER — Encounter (HOSPITAL_COMMUNITY): Payer: Self-pay | Admitting: Oral Surgery

## 2022-01-02 ENCOUNTER — Ambulatory Visit (HOSPITAL_COMMUNITY): Payer: Medicare Other | Admitting: Certified Registered"

## 2022-01-02 ENCOUNTER — Encounter (HOSPITAL_COMMUNITY)
Admission: RE | Disposition: A | Discharge status: Home / Self Care | Payer: Self-pay | Source: Home / Self Care | Attending: Oral Surgery

## 2022-01-02 DIAGNOSIS — E119 Type 2 diabetes mellitus without complications: Secondary | ICD-10-CM | POA: Insufficient documentation

## 2022-01-02 DIAGNOSIS — K029 Dental caries, unspecified: Secondary | ICD-10-CM

## 2022-01-02 DIAGNOSIS — M278 Other specified diseases of jaws: Secondary | ICD-10-CM

## 2022-01-02 DIAGNOSIS — I2510 Atherosclerotic heart disease of native coronary artery without angina pectoris: Secondary | ICD-10-CM | POA: Diagnosis not present

## 2022-01-02 DIAGNOSIS — I10 Essential (primary) hypertension: Secondary | ICD-10-CM | POA: Diagnosis not present

## 2022-01-02 DIAGNOSIS — K0889 Other specified disorders of teeth and supporting structures: Secondary | ICD-10-CM | POA: Diagnosis not present

## 2022-01-02 HISTORY — PX: TOOTH EXTRACTION: SHX859

## 2022-01-02 HISTORY — DX: Sleep apnea, unspecified: G47.30

## 2022-01-02 LAB — BASIC METABOLIC PANEL
Anion gap: 9 (ref 5–15)
BUN: 17 mg/dL (ref 8–23)
CO2: 29 mmol/L (ref 22–32)
Calcium: 9.3 mg/dL (ref 8.9–10.3)
Chloride: 99 mmol/L (ref 98–111)
Creatinine, Ser: 1.11 mg/dL — ABNORMAL HIGH (ref 0.44–1.00)
GFR, Estimated: 49 mL/min — ABNORMAL LOW (ref >60)
Glucose, Bld: 119 mg/dL — ABNORMAL HIGH (ref 70–99)
Potassium: 3.6 mmol/L (ref 3.5–5.1)
Sodium: 137 mmol/L (ref 135–145)

## 2022-01-02 LAB — CBC
HCT: 35.1 % — ABNORMAL LOW (ref 36.0–46.0)
Hemoglobin: 11.4 g/dL — ABNORMAL LOW (ref 12.0–15.0)
MCH: 30 pg (ref 26.0–34.0)
MCHC: 32.5 g/dL (ref 30.0–36.0)
MCV: 92.4 fL (ref 80.0–100.0)
Platelets: 329 10*3/uL (ref 150–400)
RBC: 3.8 MIL/uL — ABNORMAL LOW (ref 3.87–5.11)
RDW: 12.8 % (ref 11.5–15.5)
WBC: 8.6 10*3/uL (ref 4.0–10.5)
nRBC: 0 % (ref 0.0–0.2)

## 2022-01-02 LAB — GLUCOSE, CAPILLARY
Glucose-Capillary: 114 mg/dL — ABNORMAL HIGH (ref 70–99)
Glucose-Capillary: 169 mg/dL — ABNORMAL HIGH (ref 70–99)

## 2022-01-02 SURGERY — DENTAL RESTORATION/EXTRACTIONS
Anesthesia: General | Site: Mouth

## 2022-01-02 MED ORDER — CEFAZOLIN SODIUM-DEXTROSE 2-4 GM/100ML-% IV SOLN
2.0000 g | INTRAVENOUS | Status: AC
  Administered 2022-01-02: 2 g via INTRAVENOUS
  Filled 2022-01-02: qty 100

## 2022-01-02 MED ORDER — ONDANSETRON HCL 4 MG/2ML IJ SOLN
INTRAMUSCULAR | Status: DC | PRN
  Administered 2022-01-02: 4 mg via INTRAVENOUS

## 2022-01-02 MED ORDER — SODIUM CHLORIDE 0.9 % IR SOLN
Status: DC | PRN
  Administered 2022-01-02: 1

## 2022-01-02 MED ORDER — PHENYLEPHRINE HCL-NACL 20-0.9 MG/250ML-% IV SOLN
INTRAVENOUS | Status: DC | PRN
  Administered 2022-01-02: 25 ug/min via INTRAVENOUS

## 2022-01-02 MED ORDER — PROPOFOL 10 MG/ML IV BOLUS
INTRAVENOUS | Status: AC
  Filled 2022-01-02: qty 20

## 2022-01-02 MED ORDER — ROCURONIUM BROMIDE 10 MG/ML (PF) SYRINGE
PREFILLED_SYRINGE | INTRAVENOUS | Status: DC | PRN
  Administered 2022-01-02: 40 mg via INTRAVENOUS

## 2022-01-02 MED ORDER — LIDOCAINE 2% (20 MG/ML) 5 ML SYRINGE
INTRAMUSCULAR | Status: DC | PRN
  Administered 2022-01-02: 100 mg via INTRAVENOUS

## 2022-01-02 MED ORDER — OXYCODONE-ACETAMINOPHEN 5-325 MG PO TABS: 1.0000 | ORAL_TABLET | Freq: Four times a day (QID) | ORAL | 0 refills | Status: DC | PRN

## 2022-01-02 MED ORDER — INSULIN ASPART 100 UNIT/ML IJ SOLN: 0.0000 [IU] | INTRAMUSCULAR | Status: DC | PRN

## 2022-01-02 MED ORDER — LACTATED RINGERS IV SOLN: INTRAVENOUS | Status: DC

## 2022-01-02 MED ORDER — DEXAMETHASONE SODIUM PHOSPHATE 10 MG/ML IJ SOLN
INTRAMUSCULAR | Status: AC
  Filled 2022-01-02: qty 1

## 2022-01-02 MED ORDER — ONDANSETRON HCL 4 MG/2ML IJ SOLN
INTRAMUSCULAR | Status: AC
  Filled 2022-01-02: qty 2

## 2022-01-02 MED ORDER — AMOXICILLIN 500 MG PO CAPS: 500.0000 mg | ORAL_CAPSULE | Freq: Three times a day (TID) | ORAL | 0 refills | Status: DC

## 2022-01-02 MED ORDER — VITAMIN D3 125 MCG (5000 UT) PO CAPS: 1.0000 | ORAL_CAPSULE | Freq: Every day | ORAL | 1 refills | Status: AC

## 2022-01-02 MED ORDER — ORAL CARE MOUTH RINSE: 15.0000 mL | Freq: Once | OROMUCOSAL | Status: AC

## 2022-01-02 MED ORDER — PHENYLEPHRINE 80 MCG/ML (10ML) SYRINGE FOR IV PUSH (FOR BLOOD PRESSURE SUPPORT)
PREFILLED_SYRINGE | INTRAVENOUS | Status: AC
  Filled 2022-01-02: qty 10

## 2022-01-02 MED ORDER — OXYMETAZOLINE HCL 0.05 % NA SOLN
NASAL | Status: AC
  Filled 2022-01-02: qty 30

## 2022-01-02 MED ORDER — 0.9 % SODIUM CHLORIDE (POUR BTL) OPTIME
TOPICAL | Status: DC | PRN
  Administered 2022-01-02: 1000 mL

## 2022-01-02 MED ORDER — LIDOCAINE 2% (20 MG/ML) 5 ML SYRINGE
INTRAMUSCULAR | Status: AC
Start: 2022-01-02 — End: ?
  Filled 2022-01-02: qty 5

## 2022-01-02 MED ORDER — DEXAMETHASONE SODIUM PHOSPHATE 10 MG/ML IJ SOLN
INTRAMUSCULAR | Status: DC | PRN
  Administered 2022-01-02: 4 mg via INTRAVENOUS

## 2022-01-02 MED ORDER — LIDOCAINE-EPINEPHRINE 2 %-1:100000 IJ SOLN
INTRAMUSCULAR | Status: AC
  Filled 2022-01-02: qty 1

## 2022-01-02 MED ORDER — PROPOFOL 10 MG/ML IV BOLUS
INTRAVENOUS | Status: DC | PRN
  Administered 2022-01-02: 100 mg via INTRAVENOUS

## 2022-01-02 MED ORDER — FENTANYL CITRATE (PF) 100 MCG/2ML IJ SOLN
INTRAMUSCULAR | Status: DC | PRN
  Administered 2022-01-02: 50 ug via INTRAVENOUS

## 2022-01-02 MED ORDER — LIDOCAINE-EPINEPHRINE 2 %-1:100000 IJ SOLN
INTRAMUSCULAR | Status: DC | PRN
  Administered 2022-01-02: 20 mL via INTRADERMAL

## 2022-01-02 MED ORDER — FENTANYL CITRATE (PF) 250 MCG/5ML IJ SOLN
INTRAMUSCULAR | Status: AC
  Filled 2022-01-02: qty 5

## 2022-01-02 MED ORDER — CHLORHEXIDINE GLUCONATE 0.12 % MT SOLN
15.0000 mL | Freq: Once | OROMUCOSAL | Status: AC
  Administered 2022-01-02: 15 mL via OROMUCOSAL
  Filled 2022-01-02: qty 15

## 2022-01-02 MED ORDER — ROCURONIUM BROMIDE 10 MG/ML (PF) SYRINGE
PREFILLED_SYRINGE | INTRAVENOUS | Status: AC
  Filled 2022-01-02: qty 10

## 2022-01-02 MED ORDER — PHENYLEPHRINE 80 MCG/ML (10ML) SYRINGE FOR IV PUSH (FOR BLOOD PRESSURE SUPPORT)
PREFILLED_SYRINGE | INTRAVENOUS | Status: DC | PRN
  Administered 2022-01-02 (×2): 160 ug via INTRAVENOUS
  Administered 2022-01-02: 240 ug via INTRAVENOUS
  Administered 2022-01-02: 80 ug via INTRAVENOUS

## 2022-01-02 MED ORDER — SUGAMMADEX SODIUM 200 MG/2ML IV SOLN
INTRAVENOUS | Status: DC | PRN
  Administered 2022-01-02: 200 mg via INTRAVENOUS

## 2022-01-02 SURGICAL SUPPLY — 36 items
BAG COUNTER SPONGE SURGICOUNT (BAG) IMPLANT
BLADE SURG 15 STRL LF DISP TIS (BLADE) ×1 IMPLANT
BLADE SURG 15 STRL SS (BLADE) ×1
BUR CROSS CUT FISSURE 1.6 (BURR) ×1 IMPLANT
BUR EGG ELITE 4.0 (BURR) ×1 IMPLANT
CANISTER SUCT 3000ML PPV (MISCELLANEOUS) ×1 IMPLANT
COVER SURGICAL LIGHT HANDLE (MISCELLANEOUS) ×1 IMPLANT
DRAPE U-SHAPE 76X120 STRL (DRAPES) ×1 IMPLANT
GAUZE PACKING FOLDED 2  STR (GAUZE/BANDAGES/DRESSINGS) ×1
GAUZE PACKING FOLDED 2 STR (GAUZE/BANDAGES/DRESSINGS) ×1 IMPLANT
GLOVE BIO SURGEON STRL SZ 6.5 (GLOVE) IMPLANT
GLOVE BIO SURGEON STRL SZ7 (GLOVE) IMPLANT
GLOVE BIO SURGEON STRL SZ8 (GLOVE) ×1 IMPLANT
GLOVE BIOGEL PI IND STRL 6.5 (GLOVE) IMPLANT
GLOVE BIOGEL PI IND STRL 7.0 (GLOVE) IMPLANT
GOWN STRL REUS W/ TWL LRG LVL3 (GOWN DISPOSABLE) ×1 IMPLANT
GOWN STRL REUS W/ TWL XL LVL3 (GOWN DISPOSABLE) ×1 IMPLANT
GOWN STRL REUS W/TWL LRG LVL3 (GOWN DISPOSABLE) ×1
GOWN STRL REUS W/TWL XL LVL3 (GOWN DISPOSABLE) ×1
IV NS 1000ML (IV SOLUTION) ×1
IV NS 1000ML BAXH (IV SOLUTION) ×1 IMPLANT
KIT BASIN OR (CUSTOM PROCEDURE TRAY) ×1 IMPLANT
KIT TURNOVER KIT B (KITS) ×1 IMPLANT
NDL HYPO 25GX1X1/2 BEV (NEEDLE) ×2 IMPLANT
NEEDLE HYPO 25GX1X1/2 BEV (NEEDLE) ×2 IMPLANT
NS IRRIG 1000ML POUR BTL (IV SOLUTION) ×1 IMPLANT
PAD ARMBOARD 7.5X6 YLW CONV (MISCELLANEOUS) ×1 IMPLANT
SLEEVE IRRIGATION ELITE 7 (MISCELLANEOUS) ×1 IMPLANT
SPIKE FLUID TRANSFER (MISCELLANEOUS) ×1 IMPLANT
SPONGE SURGIFOAM ABS GEL 12-7 (HEMOSTASIS) IMPLANT
SUT CHROMIC 3 0 PS 2 (SUTURE) ×1 IMPLANT
SYR BULB IRRIG 60ML STRL (SYRINGE) ×1 IMPLANT
SYR CONTROL 10ML LL (SYRINGE) ×1 IMPLANT
TRAY ENT MC OR (CUSTOM PROCEDURE TRAY) ×1 IMPLANT
TUBING IRRIGATION (MISCELLANEOUS) ×1 IMPLANT
YANKAUER SUCT BULB TIP NO VENT (SUCTIONS) ×1 IMPLANT

## 2022-01-02 NOTE — H&P (Signed)
H&P documentation  -History and Physical Reviewed  -Patient has been re-examined  -No change in the plan of care  Hayley Knight  

## 2022-01-02 NOTE — Op Note (Signed)
NAME: Hayley Knight, Hayley Knight MEDICAL RECORD NO: [id number] ACCOUNT NO: [id number] DATE OF BIRTH: [birth year] FACILITY: [facility] LOCATION: [facility] PHYSICIAN: Gae Bon, DDS  Operative Report   DATE OF PROCEDURE: 01/02/2022   PREOPERATIVE DIAGNOSIS:   Nonrestorable teeth numbers 2, 3, 6, 7, 8, 9, 10, 11, 20, 21, 22, 23, 24, 25, 26, 27, 28, 29 secondary to dental caries, bilateral mandibular lingual tori.  POSTOPERATIVE DIAGNOSIS:  Nonrestorable teeth numbers 2, 3, 6, 7, 8, 9, 10, 11, 20, 21, 22, 23, 24, 25, 26, 27, 28, 29 secondary to dental caries, bilateral mandibular lingual tori.  PROCEDURE:  Extraction teeth numbers 2, 3, 6, 7, 8, 9, 10, 11, 20, 21, 22, 23, 24, 25, 26, 27, 28, 29 alveoloplasty, right and left maxilla and mandible, removal of bilateral mandibular lingual tori.  SURGEON:  Gae Bon, DDS  ANESTHESIA:  General, nasal intubation.  ATTENDING: [place].  DESCRIPTION OF PROCEDURE:  The patient was taken to the operating room and placed on the table in supine position.  General anesthesia was administered and nasal endotracheal tube was placed and secured.  The eyes were protected and the patient was  draped for surgery.  Timeout was performed.  The posterior pharynx was suctioned and a throat pack was placed.  2% lidocaine 1:100,000 epinephrine was infiltrated in an inferior alveolar block on the right and left side and in buccal and palatal  infiltration in the maxilla around the teeth to be removed.  Additional anesthesia was deposited in the anterior mandible buccal vestibule.  A bite block was placed on the right side of the mouth.  A sweetheart retractor was used to retract the tongue.   A #15 blade was used to make an incision beginning in the area of the second molar along the alveolar edentulous ridge, carried forward to tooth #20.  At this point, the incision was created both buccally and lingually in the gingival sulcus until tooth  #27 was encountered.  The  periosteum was reflected.  The teeth were elevated with 301 elevator and then teeth numbers 20, 21, 22, 23, 24, 25, 26 and 27 were removed with dental forceps.  During removal portions of the alveolar bone, buccal plate  fractured.  The sockets were curetted.  Then, the periosteum was reflected to expose both the alveolar crest and the lingual torus.  The S[place]in retractor was used to reflect the lingual tissues and protect them while the egg bur was used and the Stryker  handpiece under irrigation to remove the lingual torus by recontouring.  Then, the alveolar ridge was irregular owing to the concavity of the buccal ridge anteriorly and also to the fragmented alveolar ridge that resulted from the extraction of the  teeth.  Alveoplasty was performed using the egg bur and then the area was irrigated and closed with 3-0 chromic, left maxilla was operated.   Next, a 15 blade was used to make an incision in the area of the first molar in the edentulous space and carried  forward on the edentulous ridge to tooth #11.  Then, the incision was taken both buccally and palatally in the gingival sulcus until tooth #6 was encountered.  The periosteum was reflected from around these teeth.  The teeth were elevated and removed  with the dental forceps.  Bone fractured again in the anterior maxilla around tooth #11 and a portion of the bone was ankylosed to the tooth.  The sockets were curetted.  Then, the tissue was reflected and the irregular  bone contour was smoothed with the  egg bur followed by the bone file.  Then, the area was irrigated and closed with 3-0 chromic.  The bite block and sweetheart retractor were repositioned to the other side of the mouth.  A 15 blade used to make an incision around teeth numbers 27, 28, 29  in the mandible and around teeth numbers 2 and 3 in the maxilla.  The periosteum was reflected in the mandible lingually and the gingival sulcus buccally around the teeth.  The teeth were elevated  and removed from the mouth with the dental forceps.   Again, bone fractured during removal of the teeth and then the sockets were curetted.  The periosteum was reflected to expose the alveolar crest and the lingual torus.  The lingual torous was removed using the Stryker handpiece with egg bur under  irrigation and then alveoplasty was performed to smooth the bone, right mandible was irrigated and closed with 3-0 chromic.  The 15 blade was used to make an incision around teeth #2 and 3 and then the periosteum was reflected.  Tooth #2 was removed with  the dental forceps.  Tooth #3 required sectioning with a Stryker handpiece and a fissure bur under irrigation, the individual roots were removed; however, portions of the bone fractured during the removal and were ankylosed to the buccal roots.  Then,  the tissue was further reflected to expose the alveolar crest, which was irregular.  Alveoplasty was performed using the egg bur followed by the bone file.  Then, the area was irrigated and closed with 3-0 chromic.  The oral cavity was then irrigated and  suctioned.  Additional local anesthesia was administered.  Throat pack was removed.  The patient was left under care of anesthesia for extubation and transferred to recovery room with plans for discharge home through day surgery.  ESTIMATED BLOOD LOSS:  Minimal.  COMPLICATIONS:  None.  SPECIMENS:  None.     [email or web address] D: 01/02/2022 8:37:47 am T: 01/02/2022 9:03:00 am  JOB: [id number]/ [id number]

## 2022-01-02 NOTE — Op Note (Signed)
01/02/2022  8:30 AM  PATIENT:  Odella Aquas  86 y.o. female  PRE-OPERATIVE DIAGNOSIS:  NON-RESTORABLE TEETH # 2, 3, 6, 7, 8, 9, 10, 11, 20, 21, 22, 23, 24, 25, 26, 27, 28, 29 SECONDARY TO DENTAL CARIES; BILATERAL MANDIBULAR LINGUAL TORI  POST-OPERATIVE DIAGNOSIS:  SAME  PROCEDURE:  Procedure(s): EXTRACTION TEETH # 2, 3, 6, 7, 8, 9, 10, 11, 20, 21, 22, 23, 24, 25, 26, 27, 28, 29; ALVEOLOPLASTY, REMOVAL BILATERAL MANDIBULAR LINGUAL TORI  SURGEON:  Surgeon(s): Diona Browner, DMD  ANESTHESIA:   local and general  EBL:  minimal  DRAINS: none   SPECIMEN:  No Specimen  COUNTS:  YES  PLAN OF CARE: Discharge to home after PACU  PATIENT DISPOSITION:  PACU - hemodynamically stable.   PROCEDURE DETAILS: Dictation # [id number]  Gae Bon, DMD 01/02/2022 8:30 AM

## 2022-01-02 NOTE — Anesthesia Procedure Notes (Signed)
Procedure Name: Intubation Date/Time: 01/02/2022 7:32 AM  Performed by: Barrington Ellison, CRNAPre-anesthesia Checklist: Patient identified, Emergency Drugs available, Suction available and Patient being monitored Patient Re-evaluated:Patient Re-evaluated prior to induction Oxygen Delivery Method: Circle System Utilized Preoxygenation: Pre-oxygenation with 100% oxygen Induction Type: IV induction Ventilation: Mask ventilation without difficulty Nasal Tubes: Nasal Rae and Nasal prep performed Tube size: 6.5 mm Number of attempts: 1 Airway Equipment and Method: Video-laryngoscopy Placement Confirmation: ETT inserted through vocal cords under direct vision, positive ETCO2 and breath sounds checked- equal and bilateral Secured at: 23 cm Tube secured with: Tape Dental Injury: Teeth and Oropharynx as per pre-operative assessment

## 2022-01-02 NOTE — Anesthesia Postprocedure Evaluation (Signed)
Anesthesia Post Note  Patient: Hayley Knight  Procedure(s) Performed: DENTAL RESTORATION/EXTRACTIONS (Mouth)     Patient location during evaluation: PACU Anesthesia Type: General Level of consciousness: sedated and patient cooperative Pain management: pain level controlled Vital Signs Assessment: post-procedure vital signs reviewed and stable Respiratory status: spontaneous breathing Cardiovascular status: stable Anesthetic complications: no   No notable events documented.  Last Vitals:  Vitals:   01/02/22 0900 01/02/22 0915  BP: (!) 142/51 (!) 143/70  Pulse: 73 72  Resp: 19 15  Temp:  (!) 36.2 C  SpO2: 92% 97%    Last Pain:  Vitals:   01/02/22 0915  TempSrc:   PainSc: 0-No pain                 Nolon Nations

## 2022-01-02 NOTE — Transfer of Care (Signed)
Immediate Anesthesia Transfer of Care Note  Patient: Hayley Knight  Procedure(s) Performed: DENTAL RESTORATION/EXTRACTIONS (Mouth)  Patient Location: PACU  Anesthesia Type:General  Level of Consciousness: awake, alert  and oriented  Airway & Oxygen Therapy: Patient Spontanous Breathing and Patient connected to nasal cannula oxygen  Post-op Assessment: Report given to RN  Post vital signs: Reviewed and stable  Last Vitals:  Vitals Value Taken Time  BP 123/74   Temp    Pulse 84 01/02/22 0840  Resp 18 01/02/22 0840  SpO2 91 % 01/02/22 0840  Vitals shown include unvalidated device data.  Last Pain:  Vitals:   01/02/22 0638  TempSrc:   PainSc: 0-No pain      Patients Stated Pain Goal: 3 (76/54/65 0354)  Complications: No notable events documented.

## 2022-01-03 ENCOUNTER — Encounter (HOSPITAL_COMMUNITY): Payer: Self-pay | Admitting: Oral Surgery

## 2022-01-06 ENCOUNTER — Telehealth: Payer: Self-pay | Admitting: *Deleted

## 2022-01-06 DIAGNOSIS — E119 Type 2 diabetes mellitus without complications: Secondary | ICD-10-CM

## 2022-01-06 NOTE — Patient Outreach (Signed)
  Care Coordination   Initial Visit Note   01/06/2022 Name: Hayley Knight MRN: [id number] DOB: 01/12/1936  Hayley Knight is a 86 y.o. year old female who sees Boswell, Ardis Rowan, NP for primary care. I spoke with  Odella Aquas by phone today.  What matters to the patients health and wellness today?  Being able to afford my medications, managing my A1C 6.2 or less. Controlling my pain   Goals Addressed             This Visit's Progress    Management of DM A1C 6.2 or less          SDOH assessments and interventions completed:  Yes     Care Coordination Interventions Activated:  Yes  Care Coordination Interventions:  Yes, provided   Follow up plan: Referral made to Quinn Plowman [id number] 11:30 and  Pharmacy    Encounter Outcome:  Pt. Visit Completed   [place] Management [phone]

## 2022-01-08 ENCOUNTER — Telehealth: Payer: Self-pay | Admitting: Pharmacist

## 2022-01-08 NOTE — Progress Notes (Unsigned)
[facility]) Care Management  [place]   01/08/2022  Hayley Knight [birth year] [id number]  Reason for referral: Medication Assistance  Referral source: [facility] Case Manager Referral medication(s): Oxycodone and Januvia Current insurance:***  PMHx: ***   HPI: ***   Objective: Allergies  Allergen Reactions   Iodine Swelling   Ivp Dye [Iodinated Contrast Media] Swelling   Sulfa Antibiotics Swelling    Medications Reviewed Today     Reviewed by Jacqlyn Larsen, RN (Registered Nurse) on 01/02/22 at [phone]  Med List Status: Complete   Medication Order Taking? Sig Documenting Provider Last Dose Status Informant  amLODipine (NORVASC) 2.5 MG tablet [id number] Yes Take 2.5 mg by mouth daily. [provider] 01/02/2022 0400 Active Self  aspirin 81 MG EC tablet [id number] Yes Take 81 mg by mouth at bedtime. [provider] 12/26/2021 Active Self  Cholecalciferol (VITAMIN D3 PO) [id number] Yes Take 1,200 mg by mouth daily. [provider] Past Week Active Self  ciprofloxacin (CILOXAN) 0.3 % ophthalmic solution [id number] Yes Place 1 drop into both eyes 2 (two) times daily as needed (red eye). [provider] Past Month Active Self  clopidogrel (PLAVIX) 75 MG tablet [id number] Yes Take 75 mg by mouth daily. [provider] 12/26/2021 Active Self  diazepam (VALIUM) 5 MG tablet [id number] Yes Take 5 mg by mouth every 12 (twelve) hours as needed for anxiety. [provider] Past Week Active Self  escitalopram (LEXAPRO) 20 MG tablet [id number] Yes Take 20 mg by mouth daily. [provider] Past Week Active Self  famotidine (PEPCID) 20 MG tablet [id number] Yes Take 20 mg by mouth 2 (two) times daily. [provider] 01/02/2022 0400 Active Self           Med Note Arby Barrette   Wed Oct 22, 2021 12:23 PM)    fenofibrate (TRICOR) 145 MG tablet [id number] Yes Take 145 mg by mouth at bedtime. [provider] 01/01/2022 Active Self  fluticasone (CUTIVATE) 0.05 % cream [id number] No Apply 1 application. topically daily as needed for rash. [provider] Unknown Active Self  furosemide (LASIX) 40 MG tablet [id number] Yes Take 40 mg by mouth daily. [provider] 01/01/2022 Active Self  gabapentin (NEURONTIN) 300 MG capsule [id number] Yes Take 300 mg by mouth 2 (two) times daily as needed (Neck pain). [provider] 01/01/2022 Active Self  losartan (COZAAR) 100 MG tablet [id number] Yes Take 100 mg by mouth daily. [provider] 01/02/2022 0400 Active Self  Melatonin 10 MG TABS [id number] Yes Take 10 mg by mouth at bedtime. [provider]  Active Self  Multiple Vitamin (MULTIVITAMIN) capsule [id number] Yes Take 2 capsules by mouth daily. [provider] 01/01/2022 Active Self  Multiple Vitamins-Minerals (ONE A DAY IMMUNITY DEFENSE PO) [id number] Yes Take 2 tablets by mouth daily. [provider] Past Week Active Self  nitroGLYCERIN (NITROGLYN) 2 % ointment [id number] No Apply 0.5 inches topically every 6 (six) hours.  Patient not taking: Reported on 12/26/2021   Lujean Amel D, MD Completed Course Active Self  nitroGLYCERIN (NITROSTAT) 0.4 MG SL tablet [id number] Yes Place 0.4 mg under the tongue every 5 (five) minutes as needed for chest pain. [provider]  Active Self  omega-3 acid ethyl esters (LOVAZA) 1 G capsule [id number] Yes Take 2 g by mouth 2 (two) times daily. [provider] 01/01/2022 Active Self  oxyCODONE (OXYCONTIN) 10 mg 12 hr tablet [id number] Yes Take 20 mg by mouth every 12 (  twelve) hours. [provider] 01/02/2022 0400 Active Self  potassium chloride (K-DUR) 10 MEQ tablet [id number] Yes Take 10 mEq by mouth daily. [provider] 01/01/2022 Active Self  predniSONE (DELTASONE) 5 MG tablet [id number] Yes Take 5 mg by mouth in the morning and at bedtime. [provider] Past Month Active Self   rosuvastatin (CRESTOR) 20 MG tablet [id number] Yes Take 20 mg by mouth at bedtime. [provider] Past Week Active Self  sitaGLIPtin (JANUVIA) 100 MG tablet [id number] Yes Take 100 mg by mouth daily. [provider] Past Week Active Self  Vibegron (GEMTESA) 75 MG TABS [id number] Yes Take 75 mg by mouth daily. Bjorn Loser, MD 01/01/2022 Active Self            Medication Assistance Findings:  Medication assistance needs identified: Januvia.  Spoke with patient over the phone. HIPAA identifiers were obtained. Patient expressed       Additional medication assistance options reviewed with patient as warranted:  No other options identified  Plan: I will route patient assistance letter to [place] technician who will coordinate patient assistance program application process for medications listed above.  [facility] pharmacy technician will assist with obtaining all required documents from both patient and provider(s) and submit application(s) once completed.

## 2022-01-14 ENCOUNTER — Telehealth: Payer: Self-pay | Admitting: Pharmacy Technician

## 2022-01-14 DIAGNOSIS — Z596 Low income: Secondary | ICD-10-CM

## 2022-01-14 NOTE — Progress Notes (Signed)
[facility])                                            [place] Team    01/14/2022  Hayley Knight May 23, 1935 [id number]                                      Medication Assistance Referral  Referral From: [place]  Medication/Company: [employer] / Celesta Gentile Patient application portion:  Education officer, museum portion:  Mailed to Dr. Lamonte Sakai Provider address/fax verified via: Office website  Hayley Knight, [place]  [phone]

## 2022-01-19 DIAGNOSIS — M25521 Pain in right elbow: Secondary | ICD-10-CM | POA: Diagnosis not present

## 2022-01-19 DIAGNOSIS — M25511 Pain in right shoulder: Secondary | ICD-10-CM | POA: Diagnosis not present

## 2022-01-19 DIAGNOSIS — E1165 Type 2 diabetes mellitus with hyperglycemia: Secondary | ICD-10-CM | POA: Diagnosis not present

## 2022-01-19 DIAGNOSIS — M545 Low back pain, unspecified: Secondary | ICD-10-CM | POA: Diagnosis not present

## 2022-01-19 DIAGNOSIS — M542 Cervicalgia: Secondary | ICD-10-CM | POA: Diagnosis not present

## 2022-01-19 DIAGNOSIS — R52 Pain, unspecified: Secondary | ICD-10-CM | POA: Diagnosis not present

## 2022-01-23 ENCOUNTER — Telehealth: Payer: Self-pay | Admitting: *Deleted

## 2022-01-23 NOTE — Chronic Care Management (AMB) (Unsigned)
  Care Coordination  Outreach Note  01/23/2022 Name: Hayley Knight MRN: [id number] DOB: 26-Oct-1935   Care Coordination Outreach Attempts: An unsuccessful telephone outreach was attempted today to offer the patient information about available care coordination services as a benefit of their health plan.   Rescheduling   Follow Up Plan:  Additional outreach attempts will be made to offer the patient care coordination information and services.   Encounter Outcome:  No Answer  Julian Hy, [place] Direct Dial: [phone]

## 2022-01-26 ENCOUNTER — Ambulatory Visit: Payer: Medicare Other | Admitting: Urology

## 2022-01-26 NOTE — Chronic Care Management (AMB) (Signed)
  Care Coordination   Note   01/26/2022 Name: Hayley Knight MRN: [id number] DOB: 10/16/1935  Hayley Knight is a 86 y.o. year old female who sees Boswell, Ardis Rowan, NP for primary care. I reached out to Odella Aquas by phone today to offer care coordination services.  Rescheduling   [name] was given information about Care Coordination services today including:   The Care Coordination services include support from the care team which includes your Nurse Coordinator, Clinical Social Worker, or Pharmacist.  The Care Coordination team is here to help remove barriers to the health concerns and goals most important to you. Care Coordination services are voluntary, and the patient may decline or stop services at any time by request to their care team member.   Care Coordination Consent Status: Patient agreed to services and verbal consent obtained.   Follow up plan:  Telephone appointment with care coordination team member scheduled for:  02/06/2022  Encounter Outcome:  Pt. Scheduled  Julian Hy, [place] Direct Dial: [phone]

## 2022-02-06 ENCOUNTER — Ambulatory Visit: Payer: Self-pay

## 2022-02-06 NOTE — Patient Outreach (Signed)
  Care Coordination   Follow Up Visit Note   02/06/2022 Name: Hayley Knight MRN: [id number] DOB: 1935/10/05  Hayley Knight is a 86 y.o. year old female who sees Boswell, Ardis Rowan, NP for primary care. I spoke with  Odella Aquas by phone today.  What matters to the patients health and wellness today?  Patient states she does feels she needs follow up from a nurse.  Patient states she has been contacted by the [facility] care management pharmacist for medication assistance. Patient reports losing her son in February 2023 in an accident, losing her daughter 1 month ago, and her 3rd child having cancer.  Patient declined referral to Education officer, museum. She states she is strong and managing ok at this time.      Goals Addressed             This Visit's Progress    COMPLETED: Care coordination activities:  No follow up needed.       Care Coordination Interventions: Care coordination program/ services discussed.  Offered follow up with RNCM to provide education/ management of health conditions. Social determinants of health survey completed Discussed and offered social work follow up due to recent death of son/ daughter Patient advised to contact primary care provider office  if care coordination services needed in the future         SDOH assessments and interventions completed:  Yes  SDOH Interventions Today    Flowsheet Row Most Recent Value  SDOH Interventions   Food Insecurity Interventions Intervention Not Indicated  Housing Interventions Intervention Not Indicated        Care Coordination Interventions Activated:  Yes  Care Coordination Interventions:  Yes, provided   Follow up plan: No further intervention required.   Encounter Outcome:  Pt. Visit Completed   Quinn Plowman RN,BSN,CCM [place] [phone] direct line

## 2022-02-11 ENCOUNTER — Telehealth: Payer: Self-pay | Admitting: Pharmacy Technician

## 2022-02-11 DIAGNOSIS — Z596 Low income: Secondary | ICD-10-CM

## 2022-02-11 NOTE — Progress Notes (Signed)
[facility])                                            [place] Team    02/11/2022  CARESSE SEDIVY June 29, 1935 [id number]  Received both patient and provider portion(s) of patient assistance application(s) for Januvia. Mailed completed application and required documents into [employer].   Shreyansh Tiffany P. [place], [place]  [phone]

## 2022-02-12 DIAGNOSIS — N1831 Chronic kidney disease, stage 3a: Secondary | ICD-10-CM | POA: Diagnosis not present

## 2022-02-12 DIAGNOSIS — N183 Chronic kidney disease, stage 3 unspecified: Secondary | ICD-10-CM | POA: Diagnosis not present

## 2022-02-12 DIAGNOSIS — I25110 Atherosclerotic heart disease of native coronary artery with unstable angina pectoris: Secondary | ICD-10-CM | POA: Diagnosis not present

## 2022-02-12 DIAGNOSIS — I10 Essential (primary) hypertension: Secondary | ICD-10-CM | POA: Diagnosis not present

## 2022-02-12 DIAGNOSIS — R0602 Shortness of breath: Secondary | ICD-10-CM | POA: Diagnosis not present

## 2022-02-12 DIAGNOSIS — I2089 Other forms of angina pectoris: Secondary | ICD-10-CM | POA: Diagnosis not present

## 2022-02-12 DIAGNOSIS — E119 Type 2 diabetes mellitus without complications: Secondary | ICD-10-CM | POA: Diagnosis not present

## 2022-02-12 DIAGNOSIS — Z955 Presence of coronary angioplasty implant and graft: Secondary | ICD-10-CM | POA: Diagnosis not present

## 2022-02-12 DIAGNOSIS — I214 Non-ST elevation (NSTEMI) myocardial infarction: Secondary | ICD-10-CM | POA: Diagnosis not present

## 2022-02-18 DIAGNOSIS — R52 Pain, unspecified: Secondary | ICD-10-CM | POA: Diagnosis not present

## 2022-02-18 DIAGNOSIS — M542 Cervicalgia: Secondary | ICD-10-CM | POA: Diagnosis not present

## 2022-02-18 DIAGNOSIS — M25521 Pain in right elbow: Secondary | ICD-10-CM | POA: Diagnosis not present

## 2022-02-18 DIAGNOSIS — M25511 Pain in right shoulder: Secondary | ICD-10-CM | POA: Diagnosis not present

## 2022-02-18 DIAGNOSIS — M545 Low back pain, unspecified: Secondary | ICD-10-CM | POA: Diagnosis not present

## 2022-02-18 DIAGNOSIS — E1165 Type 2 diabetes mellitus with hyperglycemia: Secondary | ICD-10-CM | POA: Diagnosis not present

## 2022-02-18 DIAGNOSIS — Z79899 Other long term (current) drug therapy: Secondary | ICD-10-CM | POA: Diagnosis not present

## 2022-02-23 ENCOUNTER — Ambulatory Visit: Payer: Medicare Other | Admitting: Urology

## 2022-02-23 VITALS — BP 129/77 | HR 86 | Ht 60.0 in | Wt 187.0 lb

## 2022-02-23 DIAGNOSIS — R32 Unspecified urinary incontinence: Secondary | ICD-10-CM

## 2022-02-23 DIAGNOSIS — N3946 Mixed incontinence: Secondary | ICD-10-CM

## 2022-02-23 MED ORDER — OXYBUTYNIN CHLORIDE ER 10 MG PO TB24: 10.0000 mg | ORAL_TABLET | Freq: Every day | ORAL | 11 refills | Status: DC

## 2022-02-23 NOTE — Progress Notes (Signed)
02/23/2022 3:07 PM   Hayley Knight [birth year] [id number]  Referring provider: Danelle Berry, NP [address] [place],  [place] [address]  Chief Complaint  Patient presents with   Follow-up    HPI: Reviewed my note.  Primarily has bedwetting.  He has renal insufficiency as noted. Patient actually stopped wetting the bed.  She had even better control during the day with a better flow.  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   PMH: Past Medical History:  Diagnosis Date   Arthritis    Diabetes mellitus without complication ([place])    Type II   Hypertension    Sleep apnea    No Cpap    Surgical History: Past Surgical History:  Procedure Laterality Date   ABDOMINAL HYSTERECTOMY     APPENDECTOMY     CARDIAC CATHETERIZATION     CHOLECYSTECTOMY     DILATION AND CURETTAGE OF UTERUS     MASTECTOMY Right    REPLACEMENT TOTAL KNEE BILATERAL     RIGHT/LEFT HEART CATH AND CORONARY ANGIOGRAPHY Bilateral 07/09/2021   Procedure: RIGHT/LEFT HEART CATH AND CORONARY ANGIOGRAPHY;  Surgeon: Yolonda Kida, MD;  Location: [place] [facility];  Service: Cardiovascular;  Laterality: Bilateral;   TOOTH EXTRACTION N/A 01/02/2022   Procedure: DENTAL RESTORATION/EXTRACTIONS;  Surgeon: Diona Browner, DMD;  Location: [place];  Service: Oral Surgery;  Laterality: N/A;    Home Medications:  Allergies as of 02/23/2022       Reactions   Iodine Swelling   Ivp Dye [iodinated Contrast Media] Swelling   Sulfa Antibiotics Swelling        Medication List        Accurate as of February 23, 2022  3:07 PM. If you have any questions, ask your nurse or doctor.          amLODipine 2.5 MG tablet Commonly known as: NORVASC Take 2.5 mg by mouth daily.   amoxicillin 500 MG capsule Commonly known as: AMOXIL Take 1 capsule (500 mg total) by mouth 3 (three) times daily.   aspirin EC 81 MG tablet Take 81 mg by mouth at bedtime.   ciprofloxacin 0.3 % ophthalmic  solution Commonly known as: CILOXAN Place 1 drop into both eyes 2 (two) times daily as needed (red eye).   clopidogrel 75 MG tablet Commonly known as: PLAVIX Take 75 mg by mouth daily.   diazepam 5 MG tablet Commonly known as: VALIUM Take 5 mg by mouth every 12 (twelve) hours as needed for anxiety.   escitalopram 20 MG tablet Commonly known as: LEXAPRO Take 20 mg by mouth daily.   famotidine 20 MG tablet Commonly known as: PEPCID Take 20 mg by mouth 2 (two) times daily.   fenofibrate 145 MG tablet Commonly known as: TRICOR Take 145 mg by mouth at bedtime.   fluticasone 0.05 % cream Commonly known as: CUTIVATE Apply 1 application. topically daily as needed for rash.   furosemide 40 MG tablet Commonly known as: LASIX Take 40 mg by mouth daily.   gabapentin 300 MG capsule Commonly known as: NEURONTIN Take 300 mg by mouth 2 (two) times daily as needed (Neck pain).   Gemtesa 75 MG Tabs Generic drug: Vibegron Take 75 mg by mouth daily.   losartan 100 MG tablet Commonly known as: COZAAR Take 100 mg by mouth daily.   Melatonin 10 MG Tabs Take 10 mg by mouth at bedtime.   multivitamin capsule Take 2 capsules by mouth daily.  nitroGLYCERIN 2 % ointment Commonly known as: NITROGLYN Apply 0.5 inches topically every 6 (six) hours.   nitroGLYCERIN 0.4 MG SL tablet Commonly known as: NITROSTAT Place 0.4 mg under the tongue every 5 (five) minutes as needed for chest pain.   omega-3 acid ethyl esters 1 g capsule Commonly known as: LOVAZA Take 2 g by mouth 2 (two) times daily.   ONE A DAY IMMUNITY DEFENSE PO Take 2 tablets by mouth daily.   oxyCODONE 10 mg 12 hr tablet Commonly known as: OXYCONTIN Take 20 mg by mouth every 12 (twelve) hours.   oxyCODONE-acetaminophen 5-325 MG tablet Commonly known as: Percocet Take 1 tablet by mouth every 6 (six) hours as needed for severe pain.   potassium chloride 10 MEQ tablet Commonly known as: KLOR-CON Take 10 mEq by  mouth daily.   predniSONE 5 MG tablet Commonly known as: DELTASONE Take 5 mg by mouth in the morning and at bedtime.   rosuvastatin 20 MG tablet Commonly known as: CRESTOR Take 20 mg by mouth at bedtime.   sitaGLIPtin 100 MG tablet Commonly known as: JANUVIA Take 100 mg by mouth daily.   Vitamin D3 125 MCG (5000 UT) Caps Take 1 capsule (5,000 Units total) by mouth daily.        Allergies:  Allergies  Allergen Reactions   Iodine Swelling   Ivp Dye [Iodinated Contrast Media] Swelling   Sulfa Antibiotics Swelling    Family History: No family history on file.  Social History:  reports that she has never smoked. She has never used smokeless tobacco. She reports that she does not drink alcohol and does not use drugs.  ROS:                                        Physical Exam:   Laboratory Data: Lab Results  Component Value Date   WBC 8.6 01/02/2022   HGB 11.4 (L) 01/02/2022   HCT 35.1 (L) 01/02/2022   MCV 92.4 01/02/2022   PLT 329 01/02/2022    Lab Results  Component Value Date   CREATININE 1.11 (H) 01/02/2022    No results found for: "PSA"  No results found for: "TESTOSTERONE"  Lab Results  Component Value Date   HGBA1C 6.2 (H) 10/22/2021    Urinalysis    Component Value Date/Time   COLORURINE STRAW (A) 10/21/2021 2152   APPEARANCEUR Clear 11/24/2021 1309   LABSPEC 1.006 10/21/2021 2152   LABSPEC 1.015 06/02/2014 1526   PHURINE 6.0 10/21/2021 2152   GLUCOSEU Negative 11/24/2021 1309   GLUCOSEU Negative 06/02/2014 1526   HGBUR MODERATE (A) 10/21/2021 2152   BILIRUBINUR Negative 11/24/2021 1309   BILIRUBINUR Negative 06/02/2014 1526   KETONESUR NEGATIVE 10/21/2021 2152   PROTEINUR Negative 11/24/2021 1309   PROTEINUR NEGATIVE 10/21/2021 2152   NITRITE Negative 11/24/2021 1309   NITRITE NEGATIVE 10/21/2021 2152   LEUKOCYTESUR Negative 11/24/2021 1309   LEUKOCYTESUR NEGATIVE 10/21/2021 2152   LEUKOCYTESUR Negative  06/02/2014 1526    Pertinent Imaging:   Assessment & Plan: We will try oxybutynin ER 10 mg 30x11.  Although she never filled the prescription we can always try and go back on the [place] since she did quite well but now she has foot on the floor syndrome.  Percutaneous tibial nerve stimulation with an overactive bladder agent something to consider.  I think Botox and InterStim might be helpful as well.  Unfortunate  79 year old daughter recently passed away from a heart attack and had diabetes and some other issues.  1. Urinary incontinence, unspecified type  - Urinalysis, Complete   No follow-ups on file.  Reece Packer, MD  [place] [address], [place], [place] [address] [phone]

## 2022-02-24 LAB — URINALYSIS, COMPLETE
Bilirubin, UA: NEGATIVE
Glucose, UA: NEGATIVE
Ketones, UA: NEGATIVE
Leukocytes,UA: NEGATIVE
Nitrite, UA: NEGATIVE
Protein,UA: NEGATIVE
RBC, UA: NEGATIVE
Specific Gravity, UA: <1.005 — ABNORMAL LOW (ref 1.005–1.030)
Urobilinogen, Ur: 0.2 mg/dL (ref 0.2–1.0)
pH, UA: 5 (ref 5.0–7.5)

## 2022-02-24 LAB — MICROSCOPIC EXAMINATION: Bacteria, UA: NONE SEEN

## 2022-03-02 ENCOUNTER — Telehealth: Payer: Self-pay | Admitting: Pharmacy Technician

## 2022-03-02 DIAGNOSIS — Z596 Low income: Secondary | ICD-10-CM

## 2022-03-02 NOTE — Progress Notes (Signed)
[facility])                                            [place] Team    03/02/2022  Hayley Knight 09-Apr-1936 [id number]  Care coordination call placed to [employer] in regard to [facility] application.  Spoke to [place] who informs the attestation form as mailed to the patient on 02/19/22.  Successful outreach call placed to patient in regard to above information. HIPAA verified. Patient informs she received the letter and threw it away because she was not approved. Informed patient that the letter makes it seem that way but further down in the letter it states that the 2nd page needs to be completed and returned. Inquired if patient was still interested in the program as I would be glad to outreach [employer] and inquire if they would send another attestation form to her. Patient was agreeable.Patient was informed to call me when the letter arrives.  Care coordination call number 2 placed to [employer] in regard to mailing out a duplicate attestation form. Spoke to Christy Sartorius who informs his Freight forwarder gave approval for another attestation form to be mailed out.  [name] P. Keldric Poyer, [place]  [phone]

## 2022-03-16 DIAGNOSIS — M25521 Pain in right elbow: Secondary | ICD-10-CM | POA: Diagnosis not present

## 2022-03-16 DIAGNOSIS — M545 Low back pain, unspecified: Secondary | ICD-10-CM | POA: Diagnosis not present

## 2022-03-16 DIAGNOSIS — M25511 Pain in right shoulder: Secondary | ICD-10-CM | POA: Diagnosis not present

## 2022-03-16 DIAGNOSIS — M7612 Psoas tendinitis, left hip: Secondary | ICD-10-CM | POA: Diagnosis not present

## 2022-03-16 DIAGNOSIS — M542 Cervicalgia: Secondary | ICD-10-CM | POA: Diagnosis not present

## 2022-03-16 DIAGNOSIS — E1165 Type 2 diabetes mellitus with hyperglycemia: Secondary | ICD-10-CM | POA: Diagnosis not present

## 2022-03-16 DIAGNOSIS — Z79899 Other long term (current) drug therapy: Secondary | ICD-10-CM | POA: Diagnosis not present

## 2022-03-16 DIAGNOSIS — R52 Pain, unspecified: Secondary | ICD-10-CM | POA: Diagnosis not present

## 2022-03-17 DIAGNOSIS — N189 Chronic kidney disease, unspecified: Secondary | ICD-10-CM | POA: Diagnosis not present

## 2022-03-17 DIAGNOSIS — I10 Essential (primary) hypertension: Secondary | ICD-10-CM | POA: Diagnosis not present

## 2022-03-17 DIAGNOSIS — E785 Hyperlipidemia, unspecified: Secondary | ICD-10-CM | POA: Diagnosis not present

## 2022-03-17 DIAGNOSIS — E119 Type 2 diabetes mellitus without complications: Secondary | ICD-10-CM | POA: Diagnosis not present

## 2022-03-23 DIAGNOSIS — M4727 Other spondylosis with radiculopathy, lumbosacral region: Secondary | ICD-10-CM | POA: Diagnosis not present

## 2022-03-23 DIAGNOSIS — R7303 Prediabetes: Secondary | ICD-10-CM | POA: Diagnosis not present

## 2022-03-23 DIAGNOSIS — M25552 Pain in left hip: Secondary | ICD-10-CM | POA: Diagnosis not present

## 2022-03-23 DIAGNOSIS — M79605 Pain in left leg: Secondary | ICD-10-CM | POA: Diagnosis not present

## 2022-03-23 DIAGNOSIS — G8929 Other chronic pain: Secondary | ICD-10-CM | POA: Diagnosis not present

## 2022-03-23 DIAGNOSIS — I10 Essential (primary) hypertension: Secondary | ICD-10-CM | POA: Diagnosis not present

## 2022-03-23 DIAGNOSIS — N189 Chronic kidney disease, unspecified: Secondary | ICD-10-CM | POA: Diagnosis not present

## 2022-03-24 DIAGNOSIS — M4727 Other spondylosis with radiculopathy, lumbosacral region: Secondary | ICD-10-CM | POA: Diagnosis not present

## 2022-03-24 DIAGNOSIS — M25552 Pain in left hip: Secondary | ICD-10-CM | POA: Diagnosis not present

## 2022-03-24 DIAGNOSIS — M79605 Pain in left leg: Secondary | ICD-10-CM | POA: Diagnosis not present

## 2022-03-30 DIAGNOSIS — N189 Chronic kidney disease, unspecified: Secondary | ICD-10-CM | POA: Diagnosis not present

## 2022-03-31 ENCOUNTER — Other Ambulatory Visit: Payer: Self-pay | Admitting: Nurse Practitioner

## 2022-03-31 DIAGNOSIS — G8929 Other chronic pain: Secondary | ICD-10-CM

## 2022-04-06 ENCOUNTER — Ambulatory Visit: Payer: Medicare Other | Admitting: Urology

## 2022-04-06 ENCOUNTER — Encounter: Payer: Self-pay | Admitting: Urology

## 2022-04-06 VITALS — BP 122/66 | HR 80 | Ht 59.0 in | Wt 188.0 lb

## 2022-04-06 DIAGNOSIS — R32 Unspecified urinary incontinence: Secondary | ICD-10-CM

## 2022-04-06 LAB — URINALYSIS, COMPLETE
Bilirubin, UA: NEGATIVE
Glucose, UA: NEGATIVE
Ketones, UA: NEGATIVE
Leukocytes,UA: NEGATIVE
Nitrite, UA: NEGATIVE
Protein,UA: NEGATIVE
RBC, UA: NEGATIVE
Specific Gravity, UA: 1.01 (ref 1.005–1.030)
Urobilinogen, Ur: 0.2 mg/dL (ref 0.2–1.0)
pH, UA: 7 (ref 5.0–7.5)

## 2022-04-06 LAB — MICROSCOPIC EXAMINATION

## 2022-04-06 NOTE — Progress Notes (Signed)
04/06/2022 11:34 AM   Hayley Knight 1936-03-13 [id number]  Referring provider: Danelle Berry, NP [address] [place],  [place] [address]  Chief Complaint  Patient presents with   Follow-up   Urinary Incontinence    HPI: Reviewed my note.  Primarily has bedwetting.  He has renal insufficiency as noted. Patient actually stopped wetting the bed.  She had even better control during the day with a better flow.  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    We will try oxybutynin ER 10 mg 30x11. Although she never filled the prescription we can always try and go back on the [facility] since she did quite well but now she has foot on the floor syndrome. Percutaneous tibial nerve stimulation with an overactive bladder agent something to consider. I think Botox and InterStim might be helpful as well. Unfortunate 83 year old daughter recently passed away from a heart attack and had diabetes and some other issues.   TOday Patient went back on Gemtesa.  She may have had decreased urine output on oxybutynin but it was difficult from history to sort out.  She is no longer having bedwetting.  She is much less foot on the floor syndrome.  Clinically not infected.    PMH: Past Medical History:  Diagnosis Date   Arthritis    Diabetes mellitus without complication ([place])    Type II   Hypertension    Sleep apnea    No Cpap    Surgical History: Past Surgical History:  Procedure Laterality Date   ABDOMINAL HYSTERECTOMY     APPENDECTOMY     CARDIAC CATHETERIZATION     CHOLECYSTECTOMY     DILATION AND CURETTAGE OF UTERUS     MASTECTOMY Right    REPLACEMENT TOTAL KNEE BILATERAL     RIGHT/LEFT HEART CATH AND CORONARY ANGIOGRAPHY Bilateral 07/09/2021   Procedure: RIGHT/LEFT HEART CATH AND CORONARY ANGIOGRAPHY;  Surgeon: Yolonda Kida, MD;  Location: [facility];  Service: Cardiovascular;  Laterality: Bilateral;   TOOTH EXTRACTION N/A 01/02/2022    Procedure: DENTAL RESTORATION/EXTRACTIONS;  Surgeon: Diona Browner, DMD;  Location: [facility];  Service: Oral Surgery;  Laterality: N/A;    Home Medications:  Allergies as of 04/06/2022       Reactions   Iodine Swelling   Ivp Dye [iodinated Contrast Media] Swelling   Sulfa Antibiotics Swelling        Medication List        Accurate as of April 06, 2022 11:34 AM. If you have any questions, ask your nurse or doctor.          amLODipine 2.5 MG tablet Commonly known as: NORVASC Take 2.5 mg by mouth daily.   amoxicillin 500 MG capsule Commonly known as: AMOXIL Take 1 capsule (500 mg total) by mouth 3 (three) times daily.   aspirin EC 81 MG tablet Take 81 mg by mouth at bedtime.   ciprofloxacin 0.3 % ophthalmic solution Commonly known as: CILOXAN Place 1 drop into both eyes 2 (two) times daily as needed (red eye).   clopidogrel 75 MG tablet Commonly known as: PLAVIX Take 75 mg by mouth daily.   diazepam 5 MG tablet Commonly known as: VALIUM Take 5 mg by mouth every 12 (twelve) hours as needed for anxiety.   escitalopram 20 MG tablet Commonly known as: LEXAPRO Take 20 mg by mouth daily.   famotidine 20 MG tablet Commonly known as: PEPCID Take 20 mg by mouth 2 (  two) times daily.   fenofibrate 145 MG tablet Commonly known as: TRICOR Take 145 mg by mouth at bedtime.   fluticasone 0.05 % cream Commonly known as: CUTIVATE Apply 1 application. topically daily as needed for rash.   furosemide 40 MG tablet Commonly known as: LASIX Take 40 mg by mouth daily.   gabapentin 300 MG capsule Commonly known as: NEURONTIN Take 300 mg by mouth 2 (two) times daily as needed (Neck pain).   losartan 100 MG tablet Commonly known as: COZAAR Take 100 mg by mouth daily.   Melatonin 10 MG Tabs Take 10 mg by mouth at bedtime.   multivitamin capsule Take 2 capsules by mouth daily.   nitroGLYCERIN 2 % ointment Commonly known as: NITROGLYN Apply 0.5 inches topically every  6 (six) hours.   nitroGLYCERIN 0.4 MG SL tablet Commonly known as: NITROSTAT Place 0.4 mg under the tongue every 5 (five) minutes as needed for chest pain.   omega-3 acid ethyl esters 1 g capsule Commonly known as: LOVAZA Take 2 g by mouth 2 (two) times daily.   ONE A DAY IMMUNITY DEFENSE PO Take 2 tablets by mouth daily.   oxybutynin 10 MG 24 hr tablet Commonly known as: DITROPAN-XL Take 1 tablet (10 mg total) by mouth daily.   oxyCODONE 10 mg 12 hr tablet Commonly known as: OXYCONTIN Take 20 mg by mouth every 12 (twelve) hours.   oxyCODONE-acetaminophen 5-325 MG tablet Commonly known as: Percocet Take 1 tablet by mouth every 6 (six) hours as needed for severe pain.   potassium chloride 10 MEQ tablet Commonly known as: KLOR-CON Take 10 mEq by mouth daily.   predniSONE 5 MG tablet Commonly known as: DELTASONE Take 5 mg by mouth in the morning and at bedtime.   rosuvastatin 20 MG tablet Commonly known as: CRESTOR Take 20 mg by mouth at bedtime.   sitaGLIPtin 100 MG tablet Commonly known as: JANUVIA Take 100 mg by mouth daily.   Vitamin D3 125 MCG (5000 UT) Caps Take 1 capsule (5,000 Units total) by mouth daily.        Allergies:  Allergies  Allergen Reactions   Iodine Swelling   Ivp Dye [Iodinated Contrast Media] Swelling   Sulfa Antibiotics Swelling    Family History: No family history on file.  Social History:  reports that she has never smoked. She has never been exposed to tobacco smoke. She has never used smokeless tobacco. She reports that she does not drink alcohol and does not use drugs.  ROS:                                        Physical Exam: There were no vitals taken for this visit.  Constitutional:  Alert and oriented, No acute distress. HEENT: [place] AT, moist mucus membranes.  Trachea midline, no masses.  Laboratory Data: Lab Results  Component Value Date   WBC 8.6 01/02/2022   HGB 11.4 (L) 01/02/2022   HCT  35.1 (L) 01/02/2022   MCV 92.4 01/02/2022   PLT 329 01/02/2022    Lab Results  Component Value Date   CREATININE 1.11 (H) 01/02/2022    No results found for: "PSA"  No results found for: "TESTOSTERONE"  Lab Results  Component Value Date   HGBA1C 6.2 (H) 10/22/2021    Urinalysis    Component Value Date/Time   COLORURINE STRAW (A) 10/21/2021 2152   APPEARANCEUR  Clear 02/23/2022 1504   LABSPEC 1.006 10/21/2021 2152   LABSPEC 1.015 06/02/2014 1526   PHURINE 6.0 10/21/2021 2152   GLUCOSEU Negative 02/23/2022 1504   GLUCOSEU Negative 06/02/2014 1526   HGBUR MODERATE (A) 10/21/2021 2152   BILIRUBINUR Negative 02/23/2022 1504   BILIRUBINUR Negative 06/02/2014 [place] 10/21/2021 2152   PROTEINUR Negative 02/23/2022 1504   PROTEINUR NEGATIVE 10/21/2021 2152   NITRITE Negative 02/23/2022 1504   NITRITE NEGATIVE 10/21/2021 2152   LEUKOCYTESUR Negative 02/23/2022 1504   LEUKOCYTESUR NEGATIVE 10/21/2021 2152   LEUKOCYTESUR Negative 06/02/2014 1526    Pertinent Imaging:   Assessment & Plan: Reassess on Gemtesa with renal insufficiency in 6 months  There are no diagnoses linked to this encounter.  No follow-ups on file.  Reece Packer, MD  [facility] [address] [place], [place] [address] [phone]

## 2022-04-07 ENCOUNTER — Ambulatory Visit: Payer: Medicare Other

## 2022-04-08 ENCOUNTER — Telehealth: Payer: Self-pay | Admitting: Pharmacy Technician

## 2022-04-08 DIAGNOSIS — Z596 Low income: Secondary | ICD-10-CM

## 2022-04-08 LAB — CULTURE, URINE COMPREHENSIVE

## 2022-04-08 NOTE — Progress Notes (Signed)
[facility])                                            [place] Team    04/08/2022  Hayley Knight 10/31/1935 [id number]  Unsuccessful outreach call placed to patient in regard to attestation form mailed to her by [employer] patient assistance program in regard to [place] application.   Someone answered the phone and said that Ms. Hayley Knight was not available.   This was my 3rd attempt in regard to requesting patient mail back the attestation form.  Will close case but will gladly reopen if patient returns the calls.  Odis Wickey P. Davide Risdon, [place]  [phone]

## 2022-04-12 ENCOUNTER — Ambulatory Visit
Admission: RE | Admit: 2022-04-12 | Discharge: 2022-04-12 | Disposition: A | Discharge status: Home / Self Care | Payer: Medicare Other | Source: Ambulatory Visit | Attending: Nurse Practitioner | Admitting: Nurse Practitioner

## 2022-04-12 DIAGNOSIS — M545 Low back pain, unspecified: Secondary | ICD-10-CM | POA: Insufficient documentation

## 2022-04-12 DIAGNOSIS — G8929 Other chronic pain: Secondary | ICD-10-CM | POA: Insufficient documentation

## 2022-04-12 DIAGNOSIS — M5126 Other intervertebral disc displacement, lumbar region: Secondary | ICD-10-CM | POA: Diagnosis not present

## 2022-04-15 DIAGNOSIS — M545 Low back pain, unspecified: Secondary | ICD-10-CM | POA: Diagnosis not present

## 2022-04-15 DIAGNOSIS — M542 Cervicalgia: Secondary | ICD-10-CM | POA: Diagnosis not present

## 2022-04-15 DIAGNOSIS — M7612 Psoas tendinitis, left hip: Secondary | ICD-10-CM | POA: Diagnosis not present

## 2022-04-15 DIAGNOSIS — R52 Pain, unspecified: Secondary | ICD-10-CM | POA: Diagnosis not present

## 2022-04-15 DIAGNOSIS — M25521 Pain in right elbow: Secondary | ICD-10-CM | POA: Diagnosis not present

## 2022-04-15 DIAGNOSIS — M25511 Pain in right shoulder: Secondary | ICD-10-CM | POA: Diagnosis not present

## 2022-04-15 DIAGNOSIS — E1165 Type 2 diabetes mellitus with hyperglycemia: Secondary | ICD-10-CM | POA: Diagnosis not present

## 2022-05-20 DIAGNOSIS — Z79891 Long term (current) use of opiate analgesic: Secondary | ICD-10-CM | POA: Diagnosis not present

## 2022-05-20 DIAGNOSIS — G890 Central pain syndrome: Secondary | ICD-10-CM | POA: Diagnosis not present

## 2022-05-20 DIAGNOSIS — M542 Cervicalgia: Secondary | ICD-10-CM | POA: Diagnosis not present

## 2022-06-03 DIAGNOSIS — M545 Low back pain, unspecified: Secondary | ICD-10-CM | POA: Diagnosis not present

## 2022-06-03 DIAGNOSIS — Z79891 Long term (current) use of opiate analgesic: Secondary | ICD-10-CM | POA: Diagnosis not present

## 2022-06-03 DIAGNOSIS — M542 Cervicalgia: Secondary | ICD-10-CM | POA: Diagnosis not present

## 2022-06-03 DIAGNOSIS — G890 Central pain syndrome: Secondary | ICD-10-CM | POA: Diagnosis not present

## 2022-06-04 ENCOUNTER — Telehealth: Payer: Self-pay

## 2022-06-04 NOTE — Telephone Encounter (Signed)
Pt called and left vm requesting a call back regarding rx for acid reflux asked if pepcid comes in '60mg'$  dose and asked for refill on acid reflux rx for Hayley Knight? Please advise

## 2022-06-05 ENCOUNTER — Other Ambulatory Visit: Payer: Self-pay | Admitting: Nurse Practitioner

## 2022-06-05 DIAGNOSIS — K219 Gastro-esophageal reflux disease without esophagitis: Secondary | ICD-10-CM

## 2022-06-05 MED ORDER — OMEPRAZOLE 40 MG PO CPDR: 40.0000 mg | DELAYED_RELEASE_CAPSULE | Freq: Every day | ORAL | 1 refills | Status: DC

## 2022-06-05 NOTE — Telephone Encounter (Signed)
03/18/1958

## 2022-06-09 ENCOUNTER — Telehealth: Payer: Self-pay

## 2022-06-09 DIAGNOSIS — J321 Chronic frontal sinusitis: Secondary | ICD-10-CM

## 2022-06-09 MED ORDER — AMOXICILLIN 875 MG PO TABS: 875.0000 mg | ORAL_TABLET | Freq: Two times a day (BID) | ORAL | 0 refills | Status: DC

## 2022-06-09 NOTE — Telephone Encounter (Signed)
Sent!

## 2022-06-09 NOTE — Telephone Encounter (Signed)
Patient Hayley Knight stating that she is sick, she's hoarse, has green mucus and its in her head and eyes, can you send her in something

## 2022-06-10 ENCOUNTER — Other Ambulatory Visit: Payer: Self-pay | Admitting: Nurse Practitioner

## 2022-06-10 ENCOUNTER — Telehealth: Payer: Self-pay

## 2022-06-10 DIAGNOSIS — J069 Acute upper respiratory infection, unspecified: Secondary | ICD-10-CM

## 2022-06-10 MED ORDER — ALBUTEROL SULFATE HFA 108 (90 BASE) MCG/ACT IN AERS: 2.0000 | INHALATION_SPRAY | Freq: Four times a day (QID) | RESPIRATORY_TRACT | 2 refills | Status: DC | PRN

## 2022-06-10 NOTE — Telephone Encounter (Signed)
Patient called requesting rx for an albuterol inhaler

## 2022-06-12 ENCOUNTER — Other Ambulatory Visit: Payer: Self-pay

## 2022-06-12 MED ORDER — PREDNISONE 5 MG PO TABS: 5.0000 mg | ORAL_TABLET | Freq: Two times a day (BID) | ORAL | 1 refills | Status: DC

## 2022-06-15 ENCOUNTER — Ambulatory Visit (INDEPENDENT_AMBULATORY_CARE_PROVIDER_SITE_OTHER): Payer: Medicare Other | Admitting: Nurse Practitioner

## 2022-06-15 VITALS — BP 148/78 | HR 88 | Ht 61.0 in | Wt 188.0 lb

## 2022-06-15 DIAGNOSIS — E119 Type 2 diabetes mellitus without complications: Secondary | ICD-10-CM | POA: Diagnosis not present

## 2022-06-15 DIAGNOSIS — T732XXA Exhaustion due to exposure, initial encounter: Secondary | ICD-10-CM | POA: Insufficient documentation

## 2022-06-15 DIAGNOSIS — I10 Essential (primary) hypertension: Secondary | ICD-10-CM | POA: Diagnosis not present

## 2022-06-15 DIAGNOSIS — J069 Acute upper respiratory infection, unspecified: Secondary | ICD-10-CM

## 2022-06-15 DIAGNOSIS — R531 Weakness: Secondary | ICD-10-CM | POA: Diagnosis not present

## 2022-06-15 HISTORY — DX: Acute upper respiratory infection, unspecified: J06.9

## 2022-06-15 HISTORY — DX: Exhaustion due to exposure, initial encounter: T73.2XXA

## 2022-06-15 MED ORDER — PREDNISONE 50 MG PO TABS: ORAL_TABLET | ORAL | 0 refills | Status: DC

## 2022-06-15 MED ORDER — BENZONATATE 100 MG PO CAPS: 100.0000 mg | ORAL_CAPSULE | Freq: Three times a day (TID) | ORAL | 1 refills | Status: DC | PRN

## 2022-06-15 MED ORDER — METHYLPREDNISOLONE ACETATE 80 MG/ML IJ SUSP
80.0000 mg | Freq: Once | INTRAMUSCULAR | Status: AC
  Administered 2022-06-15: 80 mg via INTRAMUSCULAR

## 2022-06-15 NOTE — Progress Notes (Signed)
Established Patient Office Visit  Subjective:  Patient ID: Hayley Knight, female    DOB: Apr 05, 1936  Age: 87 y.o. MRN: [id number]  Chief Complaint  Patient presents with   Acute Visit    Sick phlegm with green mucus     Patient sick visit today reporting cough and has tried cough drops and cough syrup, reports taking antibiotic on Tuesday and she states her cough is not controlled.       Past Medical History:  Diagnosis Date   Arthritis    Diabetes mellitus without complication ([place])    Type II   Hypertension    Sleep apnea    No Cpap    Social History   Socioeconomic History   Marital status: Widowed    Spouse name: Not on file   Number of children: Not on file   Years of education: Not on file   Highest education level: Not on file  Occupational History   Not on file  Tobacco Use   Smoking status: Never    Passive exposure: Never   Smokeless tobacco: Never  Vaping Use   Vaping Use: Never used  Substance and Sexual Activity   Alcohol use: No   Drug use: No   Sexual activity: Not on file  Other Topics Concern   Not on file  Social History Narrative   Not on file   Social Determinants of Health   Financial Resource Strain: Not on file  Food Insecurity: No Food Insecurity (02/06/2022)   Hunger Vital Sign    Worried About Running Out of Food in the Last Year: Never true    Ran Out of Food in the Last Year: Never true  Transportation Needs: Not on file  Physical Activity: Not on file  Stress: Not on file  Social Connections: Not on file  Intimate Partner Violence: Not on file    No family history on file.  Allergies  Allergen Reactions   Iodine Swelling   Ivp Dye [Iodinated Contrast Media] Swelling   Sulfa Antibiotics Swelling    Review of Systems  Constitutional: Negative.   HENT:  Positive for congestion and hearing loss.   Eyes: Negative.   Respiratory:  Positive for cough, shortness of breath and wheezing.   Cardiovascular:  Negative.   Gastrointestinal: Negative.   Genitourinary: Negative.   Musculoskeletal:  Positive for back pain.  Skin:  Positive for rash.  Neurological:  Positive for headaches.  Endo/Heme/Allergies: Negative.   Psychiatric/Behavioral: Negative.         Objective:   BP (!) 148/78   Pulse 88   Ht 5' 1"$  (1.549 m)   Wt 188 lb (85.3 kg)   SpO2 90%   BMI 35.52 kg/m   Vitals:   06/15/22 1512  BP: (!) 148/78  Pulse: 88  Height: 5' 1"$  (1.549 m)  Weight: 188 lb (85.3 kg)  SpO2: 90%  BMI (Calculated): 35.54    Physical Exam Vitals reviewed.  Constitutional:      Appearance: Normal appearance.  HENT:     Head: Normocephalic.     Nose: Nose normal.     Mouth/Throat:     Mouth: Mucous membranes are moist.  Eyes:     Pupils: Pupils are equal, round, and reactive to light.  Cardiovascular:     Rate and Rhythm: Normal rate and regular rhythm.  Pulmonary:     Breath sounds: Wheezing present.  Abdominal:     General: Bowel sounds are normal.  Palpations: Abdomen is soft.  Musculoskeletal:        General: Normal range of motion.     Cervical back: Neck supple.  Skin:    General: Skin is warm and dry.  Neurological:     Mental Status: She is alert and oriented to person, place, and time.  Psychiatric:        Mood and Affect: Mood normal.        Behavior: Behavior normal.      No results found for any visits on 06/15/22.  Recent Results (from the past 2160 hour(s))  Urinalysis, Complete     Status: None   Collection Time: 04/06/22 11:41 AM  Result Value Ref Range   Specific Gravity, UA 1.010 1.005 - 1.030   pH, UA 7.0 5.0 - 7.5   Color, UA Yellow Yellow   Appearance Ur Clear Clear   Leukocytes,UA Negative Negative   Protein,UA Negative Negative/Trace   Glucose, UA Negative Negative   Ketones, UA Negative Negative   RBC, UA Negative Negative   Bilirubin, UA Negative Negative   Urobilinogen, Ur 0.2 0.2 - 1.0 mg/dL   Nitrite, UA Negative Negative    Microscopic Examination See below:   CULTURE, URINE COMPREHENSIVE     Status: None   Collection Time: 04/06/22 11:41 AM   Specimen: Urine   UR  Result Value Ref Range   Urine Culture, Comprehensive Final report    Organism ID, Bacteria Comment     Comment: Mixed urogenital flora 10,000-25,000 colony forming units per mL   Microscopic Examination     Status: None   Collection Time: 04/06/22 11:41 AM   Urine  Result Value Ref Range   WBC, UA 0-5 0 - 5 /hpf   RBC, Urine 0-2 0 - 2 /hpf   Epithelial Cells (non renal) 0-10 0 - 10 /hpf   Bacteria, UA Few None seen/Few      Assessment & Plan:   Problem List Items Addressed This Visit   None   No follow-ups on file.   Total time spent: 25 minutes  Evern Bio, NP  06/15/2022

## 2022-06-15 NOTE — Addendum Note (Signed)
Addended by: Wyvonna Plum on: 06/15/2022 04:01 PM   Modules accepted: Orders

## 2022-06-15 NOTE — Patient Instructions (Addendum)
1) DepoMedrol 80 mg IM x `1 dose today in office 2) Pred and tessalon perles, pt already on antibiotic from last week 3) CXR 4) Keep already sched appt in future

## 2022-06-21 ENCOUNTER — Other Ambulatory Visit: Payer: Self-pay | Admitting: Nurse Practitioner

## 2022-06-22 ENCOUNTER — Ambulatory Visit (INDEPENDENT_AMBULATORY_CARE_PROVIDER_SITE_OTHER): Payer: Medicare Other

## 2022-06-22 DIAGNOSIS — J069 Acute upper respiratory infection, unspecified: Secondary | ICD-10-CM

## 2022-06-23 DIAGNOSIS — Z961 Presence of intraocular lens: Secondary | ICD-10-CM | POA: Diagnosis not present

## 2022-06-29 ENCOUNTER — Ambulatory Visit: Payer: Medicare Other | Admitting: Nurse Practitioner

## 2022-07-01 ENCOUNTER — Other Ambulatory Visit: Payer: Self-pay | Admitting: Nurse Practitioner

## 2022-07-01 DIAGNOSIS — M542 Cervicalgia: Secondary | ICD-10-CM | POA: Diagnosis not present

## 2022-07-01 DIAGNOSIS — G890 Central pain syndrome: Secondary | ICD-10-CM | POA: Diagnosis not present

## 2022-07-01 DIAGNOSIS — M545 Low back pain, unspecified: Secondary | ICD-10-CM | POA: Diagnosis not present

## 2022-07-02 ENCOUNTER — Emergency Department: Payer: Medicare Other

## 2022-07-02 ENCOUNTER — Inpatient Hospital Stay
Admission: EM | Admit: 2022-07-02 | Discharge: 2022-07-06 | DRG: 178 | Disposition: A | Discharge status: Home / Self Care | Payer: Medicare Other | Attending: Obstetrics and Gynecology | Admitting: Obstetrics and Gynecology

## 2022-07-02 ENCOUNTER — Other Ambulatory Visit: Payer: Self-pay

## 2022-07-02 DIAGNOSIS — F419 Anxiety disorder, unspecified: Secondary | ICD-10-CM | POA: Diagnosis present

## 2022-07-02 DIAGNOSIS — R0602 Shortness of breath: Secondary | ICD-10-CM | POA: Diagnosis not present

## 2022-07-02 DIAGNOSIS — I10 Essential (primary) hypertension: Secondary | ICD-10-CM | POA: Diagnosis not present

## 2022-07-02 DIAGNOSIS — Z7984 Long term (current) use of oral hypoglycemic drugs: Secondary | ICD-10-CM | POA: Diagnosis not present

## 2022-07-02 DIAGNOSIS — E871 Hypo-osmolality and hyponatremia: Secondary | ICD-10-CM | POA: Diagnosis present

## 2022-07-02 DIAGNOSIS — J189 Pneumonia, unspecified organism: Secondary | ICD-10-CM

## 2022-07-02 DIAGNOSIS — N3281 Overactive bladder: Secondary | ICD-10-CM | POA: Insufficient documentation

## 2022-07-02 DIAGNOSIS — Z90710 Acquired absence of both cervix and uterus: Secondary | ICD-10-CM

## 2022-07-02 DIAGNOSIS — M48061 Spinal stenosis, lumbar region without neurogenic claudication: Secondary | ICD-10-CM | POA: Diagnosis present

## 2022-07-02 DIAGNOSIS — N1831 Chronic kidney disease, stage 3a: Secondary | ICD-10-CM | POA: Diagnosis not present

## 2022-07-02 DIAGNOSIS — G4733 Obstructive sleep apnea (adult) (pediatric): Secondary | ICD-10-CM | POA: Diagnosis not present

## 2022-07-02 DIAGNOSIS — E1122 Type 2 diabetes mellitus with diabetic chronic kidney disease: Secondary | ICD-10-CM | POA: Diagnosis present

## 2022-07-02 DIAGNOSIS — E119 Type 2 diabetes mellitus without complications: Secondary | ICD-10-CM

## 2022-07-02 DIAGNOSIS — Z7982 Long term (current) use of aspirin: Secondary | ICD-10-CM | POA: Diagnosis not present

## 2022-07-02 DIAGNOSIS — J690 Pneumonitis due to inhalation of food and vomit: Principal | ICD-10-CM

## 2022-07-02 DIAGNOSIS — F32A Depression, unspecified: Secondary | ICD-10-CM | POA: Diagnosis not present

## 2022-07-02 DIAGNOSIS — Z7902 Long term (current) use of antithrombotics/antiplatelets: Secondary | ICD-10-CM

## 2022-07-02 DIAGNOSIS — Z7952 Long term (current) use of systemic steroids: Secondary | ICD-10-CM | POA: Diagnosis not present

## 2022-07-02 DIAGNOSIS — E860 Dehydration: Secondary | ICD-10-CM | POA: Diagnosis present

## 2022-07-02 DIAGNOSIS — N189 Chronic kidney disease, unspecified: Secondary | ICD-10-CM | POA: Insufficient documentation

## 2022-07-02 DIAGNOSIS — N179 Acute kidney failure, unspecified: Secondary | ICD-10-CM

## 2022-07-02 DIAGNOSIS — I129 Hypertensive chronic kidney disease with stage 1 through stage 4 chronic kidney disease, or unspecified chronic kidney disease: Secondary | ICD-10-CM | POA: Diagnosis present

## 2022-07-02 DIAGNOSIS — M797 Fibromyalgia: Secondary | ICD-10-CM | POA: Diagnosis present

## 2022-07-02 DIAGNOSIS — Z1152 Encounter for screening for COVID-19: Secondary | ICD-10-CM

## 2022-07-02 DIAGNOSIS — N1832 Chronic kidney disease, stage 3b: Secondary | ICD-10-CM

## 2022-07-02 DIAGNOSIS — E1165 Type 2 diabetes mellitus with hyperglycemia: Secondary | ICD-10-CM

## 2022-07-02 DIAGNOSIS — Z96653 Presence of artificial knee joint, bilateral: Secondary | ICD-10-CM | POA: Diagnosis present

## 2022-07-02 DIAGNOSIS — E785 Hyperlipidemia, unspecified: Secondary | ICD-10-CM | POA: Diagnosis not present

## 2022-07-02 DIAGNOSIS — Z789 Other specified health status: Secondary | ICD-10-CM

## 2022-07-02 DIAGNOSIS — R918 Other nonspecific abnormal finding of lung field: Secondary | ICD-10-CM | POA: Diagnosis not present

## 2022-07-02 DIAGNOSIS — E878 Other disorders of electrolyte and fluid balance, not elsewhere classified: Secondary | ICD-10-CM | POA: Diagnosis present

## 2022-07-02 DIAGNOSIS — Z79899 Other long term (current) drug therapy: Secondary | ICD-10-CM

## 2022-07-02 DIAGNOSIS — Z91041 Radiographic dye allergy status: Secondary | ICD-10-CM

## 2022-07-02 DIAGNOSIS — I4510 Unspecified right bundle-branch block: Secondary | ICD-10-CM | POA: Diagnosis present

## 2022-07-02 DIAGNOSIS — Z881 Allergy status to other antibiotic agents status: Secondary | ICD-10-CM

## 2022-07-02 DIAGNOSIS — K219 Gastro-esophageal reflux disease without esophagitis: Secondary | ICD-10-CM | POA: Diagnosis not present

## 2022-07-02 DIAGNOSIS — Z882 Allergy status to sulfonamides status: Secondary | ICD-10-CM

## 2022-07-02 DIAGNOSIS — Z9011 Acquired absence of right breast and nipple: Secondary | ICD-10-CM

## 2022-07-02 HISTORY — DX: Pneumonia, unspecified organism: J18.9

## 2022-07-02 LAB — BASIC METABOLIC PANEL
Anion gap: 13 (ref 5–15)
BUN: 33 mg/dL — ABNORMAL HIGH (ref 8–23)
CO2: 25 mmol/L (ref 22–32)
Calcium: 9.2 mg/dL (ref 8.9–10.3)
Chloride: 96 mmol/L — ABNORMAL LOW (ref 98–111)
Creatinine, Ser: 1.61 mg/dL — ABNORMAL HIGH (ref 0.44–1.00)
GFR, Estimated: 31 mL/min — ABNORMAL LOW (ref >60)
Glucose, Bld: 129 mg/dL — ABNORMAL HIGH (ref 70–99)
Potassium: 3.8 mmol/L (ref 3.5–5.1)
Sodium: 134 mmol/L — ABNORMAL LOW (ref 135–145)

## 2022-07-02 LAB — RESP PANEL BY RT-PCR (RSV, FLU A&B, COVID)  RVPGX2
Influenza A by PCR: NEGATIVE
Influenza B by PCR: NEGATIVE
Resp Syncytial Virus by PCR: NEGATIVE
SARS Coronavirus 2 by RT PCR: NEGATIVE

## 2022-07-02 LAB — CBC
HCT: 36.3 % (ref 36.0–46.0)
Hemoglobin: 11.7 g/dL — ABNORMAL LOW (ref 12.0–15.0)
MCH: 28.6 pg (ref 26.0–34.0)
MCHC: 32.2 g/dL (ref 30.0–36.0)
MCV: 88.8 fL (ref 80.0–100.0)
Platelets: 384 10*3/uL (ref 150–400)
RBC: 4.09 MIL/uL (ref 3.87–5.11)
RDW: 13.1 % (ref 11.5–15.5)
WBC: 8.9 10*3/uL (ref 4.0–10.5)
nRBC: 0 % (ref 0.0–0.2)

## 2022-07-02 LAB — TROPONIN I (HIGH SENSITIVITY): Troponin I (High Sensitivity): 8 ng/L (ref <18)

## 2022-07-02 LAB — BRAIN NATRIURETIC PEPTIDE: B Natriuretic Peptide: 15.3 pg/mL (ref 0.0–100.0)

## 2022-07-02 MED ORDER — POTASSIUM CHLORIDE ER 10 MEQ PO TBCR
10.0000 meq | EXTENDED_RELEASE_TABLET | Freq: Every day | ORAL | Status: DC
  Filled 2022-07-02: qty 1

## 2022-07-02 MED ORDER — GUAIFENESIN ER 600 MG PO TB12
600.0000 mg | ORAL_TABLET | ORAL | Status: AC
  Administered 2022-07-02: 600 mg via ORAL
  Filled 2022-07-02: qty 1

## 2022-07-02 MED ORDER — IPRATROPIUM-ALBUTEROL 0.5-2.5 (3) MG/3ML IN SOLN
3.0000 mL | Freq: Four times a day (QID) | RESPIRATORY_TRACT | Status: DC
  Administered 2022-07-03: 3 mL via RESPIRATORY_TRACT
  Filled 2022-07-02 (×2): qty 3

## 2022-07-02 MED ORDER — IPRATROPIUM-ALBUTEROL 0.5-2.5 (3) MG/3ML IN SOLN
3.0000 mL | Freq: Once | RESPIRATORY_TRACT | Status: AC
  Administered 2022-07-02: 3 mL via RESPIRATORY_TRACT
  Filled 2022-07-02: qty 3

## 2022-07-02 MED ORDER — LINAGLIPTIN 5 MG PO TABS
5.0000 mg | ORAL_TABLET | Freq: Every day | ORAL | Status: DC
  Administered 2022-07-03 – 2022-07-06 (×4): 5 mg via ORAL
  Filled 2022-07-02 (×4): qty 1

## 2022-07-02 MED ORDER — SODIUM CHLORIDE 0.9 % IV SOLN: 2.0000 g | Freq: Once | INTRAVENOUS | Status: DC

## 2022-07-02 MED ORDER — ONDANSETRON HCL 4 MG PO TABS: 4.0000 mg | ORAL_TABLET | Freq: Four times a day (QID) | ORAL | Status: DC | PRN

## 2022-07-02 MED ORDER — METRONIDAZOLE 500 MG/100ML IV SOLN
500.0000 mg | Freq: Once | INTRAVENOUS | Status: AC
  Administered 2022-07-03: 500 mg via INTRAVENOUS
  Filled 2022-07-02 (×2): qty 100

## 2022-07-02 MED ORDER — CLOPIDOGREL BISULFATE 75 MG PO TABS
75.0000 mg | ORAL_TABLET | Freq: Every day | ORAL | Status: DC
  Administered 2022-07-03 – 2022-07-06 (×4): 75 mg via ORAL
  Filled 2022-07-02 (×4): qty 1

## 2022-07-02 MED ORDER — NITROGLYCERIN 0.4 MG SL SUBL: 0.4000 mg | SUBLINGUAL_TABLET | SUBLINGUAL | Status: DC | PRN

## 2022-07-02 MED ORDER — SODIUM CHLORIDE 0.9 % IV SOLN: 500.0000 mg | Freq: Once | INTRAVENOUS | Status: DC

## 2022-07-02 MED ORDER — AMLODIPINE BESYLATE 5 MG PO TABS
2.5000 mg | ORAL_TABLET | Freq: Every day | ORAL | Status: DC
  Administered 2022-07-04 – 2022-07-06 (×3): 2.5 mg via ORAL
  Filled 2022-07-02 (×4): qty 1

## 2022-07-02 MED ORDER — ONE A DAY IMMUNITY DEFENSE PO CHEW: CHEWABLE_TABLET | Freq: Every day | ORAL | Status: DC

## 2022-07-02 MED ORDER — ASPIRIN 81 MG PO TBEC
81.0000 mg | DELAYED_RELEASE_TABLET | Freq: Every day | ORAL | Status: DC
  Administered 2022-07-03 – 2022-07-05 (×3): 81 mg via ORAL
  Filled 2022-07-02 (×3): qty 1

## 2022-07-02 MED ORDER — OXYBUTYNIN CHLORIDE ER 10 MG PO TB24
10.0000 mg | ORAL_TABLET | Freq: Every day | ORAL | Status: DC
  Administered 2022-07-03 – 2022-07-06 (×4): 10 mg via ORAL
  Filled 2022-07-02 (×4): qty 1

## 2022-07-02 MED ORDER — OXYCODONE HCL ER 20 MG PO T12A
20.0000 mg | EXTENDED_RELEASE_TABLET | Freq: Two times a day (BID) | ORAL | Status: DC
  Administered 2022-07-03 – 2022-07-06 (×7): 20 mg via ORAL
  Filled 2022-07-02 (×7): qty 1

## 2022-07-02 MED ORDER — GABAPENTIN 300 MG PO CAPS: 300.0000 mg | ORAL_CAPSULE | Freq: Two times a day (BID) | ORAL | Status: DC | PRN

## 2022-07-02 MED ORDER — NITROGLYCERIN 2 % TD OINT: 0.5000 [in_us] | TOPICAL_OINTMENT | Freq: Four times a day (QID) | TRANSDERMAL | Status: DC

## 2022-07-02 MED ORDER — VITAMIN D 25 MCG (1000 UNIT) PO TABS
2000.0000 [IU] | ORAL_TABLET | Freq: Every day | ORAL | Status: DC
  Administered 2022-07-03 – 2022-07-06 (×4): 2000 [IU] via ORAL
  Filled 2022-07-02 (×4): qty 2

## 2022-07-02 MED ORDER — DIAZEPAM 5 MG PO TABS
5.0000 mg | ORAL_TABLET | Freq: Two times a day (BID) | ORAL | Status: DC | PRN
  Administered 2022-07-03 – 2022-07-05 (×3): 5 mg via ORAL
  Filled 2022-07-02 (×3): qty 1

## 2022-07-02 MED ORDER — ENOXAPARIN SODIUM 40 MG/0.4ML IJ SOSY: 40.0000 mg | PREFILLED_SYRINGE | INTRAMUSCULAR | Status: DC

## 2022-07-02 MED ORDER — PREDNISONE 20 MG PO TABS
40.0000 mg | ORAL_TABLET | ORAL | Status: AC
  Administered 2022-07-02: 40 mg via ORAL
  Filled 2022-07-02: qty 2

## 2022-07-02 MED ORDER — HYDROCOD POLI-CHLORPHE POLI ER 10-8 MG/5ML PO SUER
5.0000 mL | Freq: Two times a day (BID) | ORAL | Status: DC | PRN
  Administered 2022-07-03 – 2022-07-06 (×5): 5 mL via ORAL
  Filled 2022-07-02 (×6): qty 5

## 2022-07-02 MED ORDER — SODIUM CHLORIDE 0.9 % IV BOLUS
500.0000 mL | Freq: Once | INTRAVENOUS | Status: AC
  Administered 2022-07-02: 500 mL via INTRAVENOUS

## 2022-07-02 MED ORDER — SODIUM CHLORIDE 0.9 % IV SOLN
2.0000 g | INTRAVENOUS | Status: DC
  Administered 2022-07-03 – 2022-07-05 (×4): 2 g via INTRAVENOUS
  Filled 2022-07-02: qty 20
  Filled 2022-07-02 (×3): qty 2

## 2022-07-02 MED ORDER — GUAIFENESIN ER 600 MG PO TB12
600.0000 mg | ORAL_TABLET | Freq: Two times a day (BID) | ORAL | Status: DC
  Administered 2022-07-03 (×2): 600 mg via ORAL
  Filled 2022-07-02 (×2): qty 1

## 2022-07-02 MED ORDER — FENOFIBRATE 160 MG PO TABS
160.0000 mg | ORAL_TABLET | Freq: Every day | ORAL | Status: DC
  Administered 2022-07-03 – 2022-07-06 (×4): 160 mg via ORAL
  Filled 2022-07-02 (×4): qty 1

## 2022-07-02 MED ORDER — ONDANSETRON HCL 4 MG/2ML IJ SOLN: 4.0000 mg | Freq: Four times a day (QID) | INTRAMUSCULAR | Status: DC | PRN

## 2022-07-02 MED ORDER — ACETAMINOPHEN 325 MG PO TABS
650.0000 mg | ORAL_TABLET | Freq: Four times a day (QID) | ORAL | Status: DC | PRN
  Administered 2022-07-03 – 2022-07-05 (×2): 650 mg via ORAL
  Filled 2022-07-02 (×2): qty 2

## 2022-07-02 MED ORDER — MIRABEGRON ER 25 MG PO TB24
25.0000 mg | ORAL_TABLET | Freq: Every day | ORAL | Status: DC
  Administered 2022-07-03 – 2022-07-06 (×4): 25 mg via ORAL
  Filled 2022-07-02 (×4): qty 1

## 2022-07-02 MED ORDER — CIPROFLOXACIN HCL 0.3 % OP SOLN: 1.0000 [drp] | Freq: Two times a day (BID) | OPHTHALMIC | Status: DC | PRN

## 2022-07-02 MED ORDER — PANTOPRAZOLE SODIUM 40 MG PO TBEC
40.0000 mg | DELAYED_RELEASE_TABLET | Freq: Every day | ORAL | Status: DC
  Administered 2022-07-03 – 2022-07-06 (×4): 40 mg via ORAL
  Filled 2022-07-02 (×4): qty 1

## 2022-07-02 MED ORDER — ESCITALOPRAM OXALATE 10 MG PO TABS
20.0000 mg | ORAL_TABLET | Freq: Every day | ORAL | Status: DC
  Administered 2022-07-03 – 2022-07-06 (×4): 20 mg via ORAL
  Filled 2022-07-02 (×4): qty 2

## 2022-07-02 MED ORDER — HYDROCHLOROTHIAZIDE 12.5 MG PO TABS: 12.5000 mg | ORAL_TABLET | Freq: Every day | ORAL | Status: DC

## 2022-07-02 MED ORDER — SODIUM CHLORIDE 0.9 % IV SOLN: INTRAVENOUS | Status: DC

## 2022-07-02 MED ORDER — ALBUTEROL SULFATE (2.5 MG/3ML) 0.083% IN NEBU: 2.5000 mg | INHALATION_SOLUTION | Freq: Four times a day (QID) | RESPIRATORY_TRACT | Status: DC | PRN

## 2022-07-02 MED ORDER — ACETAMINOPHEN 650 MG RE SUPP: 650.0000 mg | Freq: Four times a day (QID) | RECTAL | Status: DC | PRN

## 2022-07-02 MED ORDER — FUROSEMIDE 40 MG PO TABS: 40.0000 mg | ORAL_TABLET | Freq: Every day | ORAL | Status: DC

## 2022-07-02 MED ORDER — OMEGA-3-ACID ETHYL ESTERS 1 G PO CAPS
2.0000 g | ORAL_CAPSULE | Freq: Two times a day (BID) | ORAL | Status: DC
  Administered 2022-07-03 – 2022-07-06 (×7): 2 g via ORAL
  Filled 2022-07-02 (×7): qty 2

## 2022-07-02 MED ORDER — ADULT MULTIVITAMIN W/MINERALS CH
1.0000 | ORAL_TABLET | Freq: Every day | ORAL | Status: DC
  Administered 2022-07-03 – 2022-07-06 (×4): 1 via ORAL
  Filled 2022-07-02 (×4): qty 1

## 2022-07-02 MED ORDER — MAGNESIUM HYDROXIDE 400 MG/5ML PO SUSP: 30.0000 mL | Freq: Every day | ORAL | Status: DC | PRN

## 2022-07-02 MED ORDER — MELATONIN 5 MG PO TABS
10.0000 mg | ORAL_TABLET | Freq: Every day | ORAL | Status: DC
  Administered 2022-07-03 – 2022-07-05 (×4): 10 mg via ORAL
  Filled 2022-07-02 (×4): qty 2

## 2022-07-02 MED ORDER — ALBUTEROL SULFATE HFA 108 (90 BASE) MCG/ACT IN AERS: 2.0000 | INHALATION_SPRAY | Freq: Four times a day (QID) | RESPIRATORY_TRACT | Status: DC | PRN

## 2022-07-02 MED ORDER — FAMOTIDINE 20 MG PO TABS
20.0000 mg | ORAL_TABLET | Freq: Two times a day (BID) | ORAL | Status: DC
  Administered 2022-07-03 – 2022-07-06 (×7): 20 mg via ORAL
  Filled 2022-07-02 (×7): qty 1

## 2022-07-02 MED ORDER — LOSARTAN POTASSIUM 50 MG PO TABS: 100.0000 mg | ORAL_TABLET | Freq: Every day | ORAL | Status: DC

## 2022-07-02 MED ORDER — ROSUVASTATIN CALCIUM 20 MG PO TABS
20.0000 mg | ORAL_TABLET | Freq: Every day | ORAL | Status: DC
  Administered 2022-07-03 – 2022-07-05 (×3): 20 mg via ORAL
  Filled 2022-07-02 (×3): qty 1

## 2022-07-02 MED ORDER — TRAZODONE HCL 50 MG PO TABS
25.0000 mg | ORAL_TABLET | Freq: Every evening | ORAL | Status: DC | PRN
  Administered 2022-07-03 – 2022-07-05 (×3): 25 mg via ORAL
  Filled 2022-07-02 (×3): qty 1

## 2022-07-02 MED ORDER — SODIUM CHLORIDE 0.9 % IV SOLN
500.0000 mg | INTRAVENOUS | Status: DC
  Administered 2022-07-03 – 2022-07-05 (×3): 500 mg via INTRAVENOUS
  Filled 2022-07-02 (×2): qty 500
  Filled 2022-07-02: qty 5

## 2022-07-02 MED ORDER — BENZONATATE 100 MG PO CAPS
100.0000 mg | ORAL_CAPSULE | Freq: Three times a day (TID) | ORAL | Status: DC | PRN
  Administered 2022-07-03: 100 mg via ORAL
  Filled 2022-07-02: qty 1

## 2022-07-02 NOTE — Assessment & Plan Note (Signed)
-   We will continue Ditropan XL.

## 2022-07-02 NOTE — Assessment & Plan Note (Signed)
>>  ASSESSMENT AND PLAN FOR ESSENTIAL HYPERTENSION WRITTEN ON 07/02/2022 11:56 PM BY MANSY, JAN A, MD  - We will continue her antihypertensives.

## 2022-07-02 NOTE — Assessment & Plan Note (Addendum)
-   We will continue statin therapy and Lovaza. 

## 2022-07-02 NOTE — Assessment & Plan Note (Signed)
-   We will continue her antihypertensives. 

## 2022-07-02 NOTE — Assessment & Plan Note (Signed)
-   We will continue PPI therapy. 

## 2022-07-02 NOTE — ED Triage Notes (Signed)
Pt to ED via POV from home. Pt reports increased SOB. Pt reports recently dx with PNA and thought she was improving. Pt reports SOB got worse x2-3 days ago. Pt states she has been using her spirometer at home.

## 2022-07-02 NOTE — Assessment & Plan Note (Addendum)
-   The patient will be admitted to a medical bed. - Will continue antibiotic therapy with IV Rocephin and Zithromax. - Mucolytic therapy be provided as well as duo nebs q.i.d. and q.4 hours p.r.n. - We will follow blood cultures. - Speech therapy consult will be obtained for the possibility of aspiration.

## 2022-07-02 NOTE — Assessment & Plan Note (Signed)
-   We will annual Valium and Lexapro

## 2022-07-02 NOTE — ED Provider Notes (Signed)
[facility] Provider Note    Event Date/Time   First MD Initiated Contact with Patient 07/02/22 2138     (approximate)   History   Chief Complaint: Shortness of Breath   HPI  Hayley Knight is a 87 y.o. female with a history of diabetes hypertension fibromyalgia lumbar spinal stenosis who comes ED complaining of shortness of breath which has been ongoing for the past 3 or 4 weeks but worse in the last 2 or 3 days.  Associated cough which is at times productive.  Worse with ambulation.  No fever or chills, no chest pain.  Not pleuritic.    She reports being treated previously with amoxicillin for pneumonia.  Also was given Tessalon.  She reports these did not improve her symptoms.     Physical Exam   Triage Vital Signs: ED Triage Vitals  Enc Vitals Group     BP 07/02/22 1413 (!) 140/86     Pulse Rate 07/02/22 1413 88     Resp 07/02/22 1413 18     Temp 07/02/22 1413 97.7 F (36.5 C)     Temp Source 07/02/22 2212 Oral     SpO2 07/02/22 1413 93 %     Weight --      Height --      Head Circumference --      Peak Flow --      Pain Score 07/02/22 1413 4     Pain Loc --      Pain Edu? --      Excl. in [place]? --     Most recent vital signs: Vitals:   07/02/22 2200 07/02/22 2212  BP: (!) 142/70   Pulse: 68   Resp: 19   Temp:  97.8 F (36.6 C)  SpO2: 95%     General: Awake, no distress.  CV:  Good peripheral perfusion.  Regular rate rhythm Resp:  Normal effort.  No focal crackles.  Symmetric air movement.  Expiratory wheezing with coughing. Abd:  No distention.  Soft nontender Other:  No edema   ED Results / Procedures / Treatments   Labs (all labs ordered are listed, but only abnormal results are displayed) Labs Reviewed  BASIC METABOLIC PANEL - Abnormal; Notable for the following components:      Result Value   Sodium 134 (*)    Chloride 96 (*)    Glucose, Bld 129 (*)    BUN 33 (*)    Creatinine, Ser 1.61 (*)    GFR, Estimated  31 (*)    All other components within normal limits  CBC - Abnormal; Notable for the following components:   Hemoglobin 11.7 (*)    All other components within normal limits  RESP PANEL BY RT-PCR (RSV, FLU A&B, COVID)  RVPGX2  BRAIN NATRIURETIC PEPTIDE  TROPONIN I (HIGH SENSITIVITY)  TROPONIN I (HIGH SENSITIVITY)     EKG Interpreted by me Normal sinus rhythm rate of 81.  Normal axis, normal intervals.  Right bundle branch block.  No acute ischemic changes.   RADIOLOGY Chest x-ray interpreted by me, unremarkable.  Radiology report reviewed  CT chest shows left basilar infiltrate concerning for pneumonia possibly aspiration   PROCEDURES:  Procedures   MEDICATIONS ORDERED IN ED: Medications  cefTRIAXone (ROCEPHIN) 2 g in sodium chloride 0.9 % 100 mL IVPB (has no administration in time range)  metroNIDAZOLE (FLAGYL) IVPB 500 mg (has no administration in time range)  sodium chloride 0.9 % bolus 500 mL (500  mLs Intravenous New Bag/Given 07/02/22 2235)  predniSONE (DELTASONE) tablet 40 mg (40 mg Oral Given 07/02/22 2244)  ipratropium-albuterol (DUONEB) 0.5-2.5 (3) MG/3ML nebulizer solution 3 mL (3 mLs Nebulization Given 07/02/22 2244)  guaiFENesin (MUCINEX) 12 hr tablet 600 mg (600 mg Oral Given 07/02/22 2244)     IMPRESSION / MDM / ASSESSMENT AND PLAN / ED COURSE  I reviewed the triage vital signs and the nursing notes.  DDx: Persistent pneumonia, pleural effusion, anemia, AKI, electrolyte abnormality, COVID/flu, non-STEMI, CHF exacerbation  Patient's presentation is most consistent with acute presentation with potential threat to life or bodily function.  Patient presents with persistent shortness of breath, worse with exertion and associated with productive cough.  Vital signs unremarkable.  Labs overall unremarkable but do reveal a mild AKI.  Additionally, CT of the chest reveals a persistent infiltrate concerning for pneumonia with outpatient treatment failure.  Concern for  possible aspiration.  Will give Rocephin and azithromycin and Flagyl for CAP plus anaerobic coverage.  Patient feels that her symptoms are not manageable at home especially given her lack of improvement.  Case discussed with hospitalist for further management.       FINAL CLINICAL IMPRESSION(S) / ED DIAGNOSES   Final diagnoses:  Aspiration pneumonia of left lower lobe, unspecified aspiration pneumonia type ([place])  Failure of outpatient treatment     Rx / DC Orders   ED Discharge Orders     None        Note:  This document was prepared using Dragon voice recognition software and may include unintentional dictation errors.   Carrie Mew, MD 07/02/22 [phone]

## 2022-07-02 NOTE — Assessment & Plan Note (Signed)
-   We will place the patient on supplemental coverage with NovoLog. - We will continue Januvia.

## 2022-07-02 NOTE — H&P (Addendum)
[place]   PATIENT NAME: Hayley Knight    MR#:  [id number]  DATE OF BIRTH:  20-Mar-1936  DATE OF ADMISSION:  07/02/2022  PRIMARY CARE PHYSICIAN: Evern Bio, NP   Patient is coming from: Home  REQUESTING/REFERRING PHYSICIAN: Brenton Grills, MD  CHIEF COMPLAINT:   Chief Complaint  Patient presents with   Shortness of Breath    HISTORY OF PRESENT ILLNESS:  Hayley Knight is a 87 y.o. Caucasian female with medical history significant for type 2 diabetes mellitus, hypertension and OSA, who presented to the emergency room with acute onset of worsening dyspnea with associated cough productive of dark green sputum which has been intermittent over the last 4 weeks.  She denies any fever or chills.  She was prescribed amoxicillin by her PCP which she took for 10 days and finished about a couple weeks ago and did not feel better.  She admits to rhinorrhea nasal congestion without earache or sore throat.  No nausea or vomiting or abdominal pain.  No chest pain or palpitations.  No dysuria, oliguria or hematuria or flank pain.  No bleeding diathesis.  ED Course: When she came to the ER, BP 148/78 with otherwise normal vital signs.  Labs revealed mild hyponatremia 134 and hypochloremia of 96 with BUN of 33 and creatinine 1.61.  High sensitive troponin I was 8 and BNP 15.3.  CBC showed hemoglobin 11.7 and hematocrit 36.3 close to previous levels.  Influenza, COVID-19 and RSV PCR's came back negative. EKG as reviewed by me :  EKG showed normal sinus rhythm with rate of 81 with right bundle branch block and T wave inversion in V1 Imaging: Two-view chest x-ray showed nonspecific interstitial prominence consistent with atypical infection, edema or interstitial lung disease with no focal consolidation.  Chest CT without contrast revealed the following: Mild linear density in the left lower lobe medially with some associated inspissated material within the lower lobe bronchial tree.  This likely represents some mild aspiration.   Stable hypodense lesion with calcification the posterior aspect of the right lobe of the liver dating back to 2009. This is almost certainly benign in etiology given its long-term stability.   Aortic Atherosclerosis.  The patient was given DuoNeb, Mucinex, IV Rocephin and Zithromax, prednisone 40 mg and normal saline 500 mill bolus.  She will be admitted to a medical bed for further evaluation and management.  PAST MEDICAL HISTORY:   Past Medical History:  Diagnosis Date   Arthritis    Diabetes mellitus without complication (HCC)    Type II   Hypertension    Sleep apnea    No Cpap    PAST SURGICAL HISTORY:   Past Surgical History:  Procedure Laterality Date   ABDOMINAL HYSTERECTOMY     APPENDECTOMY     CARDIAC CATHETERIZATION     CHOLECYSTECTOMY     DILATION AND CURETTAGE OF UTERUS     MASTECTOMY Right    REPLACEMENT TOTAL KNEE BILATERAL     RIGHT/LEFT HEART CATH AND CORONARY ANGIOGRAPHY Bilateral 07/09/2021   Procedure: RIGHT/LEFT HEART CATH AND CORONARY ANGIOGRAPHY;  Surgeon: Yolonda Kida, MD;  Location: [facility];  Service: Cardiovascular;  Laterality: Bilateral;   TOOTH EXTRACTION N/A 01/02/2022   Procedure: DENTAL RESTORATION/EXTRACTIONS;  Surgeon: Diona Browner, DMD;  Location: [facility];  Service: Oral Surgery;  Laterality: N/A;    SOCIAL HISTORY:   Social History   Tobacco Use   Smoking status: Never    Passive exposure:  Never   Smokeless tobacco: Never  Substance Use Topics   Alcohol use: No    FAMILY HISTORY:  History reviewed. No pertinent family history.  DRUG ALLERGIES:   Allergies  Allergen Reactions   Iodine Swelling   Ivp Dye [Iodinated Contrast Media] Swelling   Sulfa Antibiotics Swelling    REVIEW OF SYSTEMS:   ROS As per history of present illness. All pertinent systems were reviewed above. Constitutional, HEENT, cardiovascular, respiratory, GI, GU, musculoskeletal, neuro,  psychiatric, endocrine, integumentary and hematologic systems were reviewed and are otherwise negative/unremarkable except for positive findings mentioned above in the HPI.   MEDICATIONS AT HOME:   Prior to Admission medications   Medication Sig Start Date End Date Taking? Authorizing Provider  albuterol (VENTOLIN HFA) 108 (90 Base) MCG/ACT inhaler Inhale 2 puffs into the lungs every 6 (six) hours as needed for wheezing or shortness of breath. 06/10/22   Evern Bio, NP  amLODipine (NORVASC) 2.5 MG tablet Take 2.5 mg by mouth daily.   Yes [provider]  aspirin 81 MG EC tablet Take 81 mg by mouth at bedtime. 03/08/15  Yes [provider]  Cholecalciferol (VITAMIN D3) 125 MCG (5000 UT) CAPS Take 1 capsule (5,000 Units total) by mouth daily. 01/02/22  Yes Diona Browner, DMD  clopidogrel (PLAVIX) 75 MG tablet Take 75 mg by mouth daily. 07/14/21 07/14/22 Yes [provider]  escitalopram (LEXAPRO) 20 MG tablet Take 20 mg by mouth daily.   Yes [provider]  famotidine (PEPCID) 20 MG tablet Take 20 mg by mouth 2 (two) times daily. 05/07/21  Yes [provider]  fenofibrate (TRICOR) 145 MG tablet Take 145 mg by mouth at bedtime.   Yes [provider]  furosemide (LASIX) 40 MG tablet Take 40 mg by mouth daily.   Yes [provider]  gabapentin (NEURONTIN) 300 MG capsule Take 300 mg by mouth 2 (two) times daily as needed (Neck pain).   Yes [provider]  GEMTESA 75 MG TABS Take 1 tablet by mouth daily. 06/17/22  Yes [provider]  hydrochlorothiazide (HYDRODIURIL) 12.5 MG tablet TAKE 1 TABLET BY MOUTH EVERY DAY IN THE MORNING 07/01/22  Yes Boswell, Donnel Saxon, NP  losartan (COZAAR) 100 MG tablet Take 100 mg by mouth daily.   Yes [provider]  Melatonin 10 MG TABS Take 10 mg by mouth at bedtime. 07/13/21  Yes [provider]  Multiple Vitamin (MULTIVITAMIN) capsule Take 2 capsules by mouth daily.   Yes  [provider]  Multiple Vitamins-Minerals (ONE A DAY IMMUNITY DEFENSE PO) Take 2 tablets by mouth daily.   Yes [provider]  omega-3 acid ethyl esters (LOVAZA) 1 G capsule Take 2 g by mouth 2 (two) times daily.   Yes [provider]  omeprazole (PRILOSEC) 40 MG capsule Take 1 capsule (40 mg total) by mouth daily. 06/05/22  Yes Evern Bio, NP  oxybutynin (DITROPAN-XL) 10 MG 24 hr tablet Take 1 tablet (10 mg total) by mouth daily. 02/23/22  Yes MacDiarmid, Nicki Reaper, MD  oxyCODONE (OXYCONTIN) 10 mg 12 hr tablet Take 20 mg by mouth every 12 (twelve) hours.   Yes [provider]  potassium chloride (KLOR-CON) 10 MEQ tablet TAKE 1 TABLET BY MOUTH IN THE MORNING WITH FUROSEMIDE 06/21/22  Yes Evern Bio, NP  predniSONE (DELTASONE) 5 MG tablet Take 1 tablet (5 mg total) by mouth in the morning and at bedtime. 06/12/22  Yes Evern Bio, NP  predniSONE (DELTASONE) 50 MG tablet Take  1 tablet by mouth daily x 5 days in AM with food 06/15/22  Yes Boswell, Donnel Saxon, NP  rosuvastatin (CRESTOR) 20 MG tablet Take 20 mg by mouth at bedtime. 07/13/21 07/13/22 Yes [provider]  sitaGLIPtin (JANUVIA) 100 MG tablet Take 100 mg by mouth daily.   Yes [provider]  amoxicillin (AMOXIL) 875 MG tablet Take 1 tablet (875 mg total) by mouth 2 (two) times daily. Patient not taking: Reported on 07/02/2022 06/09/22   Evern Bio, NP  benzonatate (TESSALON PERLES) 100 MG capsule Take 1 capsule (100 mg total) by mouth 3 (three) times daily as needed for cough. 06/15/22 06/15/23  Evern Bio, NP  ciprofloxacin (CILOXAN) 0.3 % ophthalmic solution Place 1 drop into both eyes 2 (two) times daily as needed (red eye).    [provider]  diazepam (VALIUM) 5 MG tablet Take 5 mg by mouth every 12 (twelve) hours as needed for anxiety.    [provider]  fluticasone (CUTIVATE) 0.05 % cream Apply 1 application. topically daily as needed for rash. 05/22/21    [provider]  nitroGLYCERIN (NITROGLYN) 2 % ointment Apply 0.5 inches topically every 6 (six) hours. Patient not taking: Reported on 07/02/2022 07/11/21   Callwood, Karma Greaser D, MD  nitroGLYCERIN (NITROSTAT) 0.4 MG SL tablet Place 0.4 mg under the tongue every 5 (five) minutes as needed for chest pain. 07/13/21 07/13/22  [provider]      VITAL SIGNS:  Blood pressure (!) 133/57, pulse 73, temperature 97.9 F (36.6 C), temperature source Oral, resp. rate 20, height 5' 0.98" (1.549 m), weight 85.2 kg, SpO2 97 %.  PHYSICAL EXAMINATION:  Physical Exam  GENERAL:  87 y.o.-year-old patient lying in the bed with no acute distress.  EYES: Pupils equal, round, reactive to light and accommodation. No scleral icterus. Extraocular muscles intact.  HEENT: Head atraumatic, normocephalic. Oropharynx and nasopharynx clear.  NECK:  Supple, no jugular venous distention. No thyroid enlargement, no tenderness.  LUNGS: Diminished left basal breath sounds with left basal crackles. CARDIOVASCULAR: Regular rate and rhythm, S1, S2 normal. No murmurs, rubs, or gallops.  ABDOMEN: Soft, nondistended, nontender. Bowel sounds present. No organomegaly or mass.  EXTREMITIES: No pedal edema, cyanosis, or clubbing.  NEUROLOGIC: Cranial nerves II through XII are intact. Muscle strength 5/5 in all extremities. Sensation intact. Gait not checked.  PSYCHIATRIC: The patient is alert and oriented x 3.  Normal affect and good eye contact. SKIN: No obvious rash, lesion, or ulcer.   LABORATORY PANEL:   CBC Recent Labs  Lab 07/03/22 0219  WBC 9.5  HGB 11.0*  HCT 33.2*  PLT 336   ------------------------------------------------------------------------------------------------------------------  Chemistries  Recent Labs  Lab 07/03/22 0219  NA 129*  K 3.4*  CL 93*  CO2 24  GLUCOSE 155*  BUN 31*  CREATININE 1.32*  CALCIUM 8.7*    ------------------------------------------------------------------------------------------------------------------  Cardiac Enzymes No results for input(s): "TROPONINI" in the last 168 hours. ------------------------------------------------------------------------------------------------------------------  RADIOLOGY:  CT Chest Wo Contrast  Result Date: 07/02/2022 CLINICAL DATA:  Increased shortness of breath EXAM: CT CHEST WITHOUT CONTRAST TECHNIQUE: Multidetector CT imaging of the chest was performed following the standard protocol without IV contrast. RADIATION DOSE REDUCTION: This exam was performed according to the departmental dose-optimization program which includes automated exposure control, adjustment of the mA and/or kV according to patient size and/or use of iterative reconstruction technique. COMPARISON:  Chest x-ray from earlier in the same day, CT from 10/21/2021. FINDINGS: Cardiovascular: Limited due to lack of IV contrast. Atherosclerotic  calcifications are noted. No aneurysmal dilatation is seen. Heart is not significantly enlarged. Coronary calcifications are noted. Mediastinum/Nodes: Thoracic inlet is within normal limits. No hilar or mediastinal adenopathy is noted. The esophagus as visualized is within normal limits. Lungs/Pleura: Lungs are well aerated bilaterally. Very mild linear density is noted in the left lung base. Some associated inspissated material in the lower lobe bronchial tree is noted new from the prior exam. No focal parenchymal nodule is seen. No infiltrate is seen. Upper Abdomen: Visualized upper abdomen shows a partially calcified hypodense lesion posteriorly in the right lobe of the liver stable in appearance from the prior exam. This is stable dating back to 2009. Gallbladder has been surgically removed. Musculoskeletal: Degenerative changes of the thoracic spine are noted. No acute rib abnormality is seen. IMPRESSION: Mild linear density in the left lower lobe  medially with some associated inspissated material within the lower lobe bronchial tree. This likely represents some mild aspiration. Stable hypodense lesion with calcification the posterior aspect of the right lobe of the liver dating back to 2009. This is almost certainly benign in etiology given its long-term stability. Aortic Atherosclerosis (ICD10-I70.0). Electronically Signed   By: Inez Catalina M.D.   On: 07/02/2022 22:32   DG Chest 2 View  Result Date: 07/02/2022 CLINICAL DATA:  SOB EXAM: CHEST - 2 VIEW COMPARISON:  11/07/2021 FINDINGS: The heart size and mediastinal contours are within normal limits. There is diffuse pulmonary interstitial prominence which could be seen with atypical infection, edema or interstitial lung disease. There is no focal consolidation. No pneumothorax or pleural effusion. There are thoracic degenerative changes. IMPRESSION: Nonspecific interstitial prominence consistent with atypical infection, edema or interstitial lung disease. No focal consolidation. Electronically Signed   By: Sammie Bench M.D.   On: 07/02/2022 14:57      IMPRESSION AND PLAN:  Assessment and Plan: * CAP (community acquired pneumonia) - The patient will be admitted to a medical bed. - Will continue antibiotic therapy with IV Rocephin and Zithromax. - Mucolytic therapy be provided as well as duo nebs q.i.d. and q.4 hours p.r.n. - We will follow blood cultures. - Speech therapy consult will be obtained for the possibility of aspiration.   Type 2 diabetes mellitus without complications (HCC) - We will place the patient on supplemental coverage with NovoLog. - We will continue Januvia.  Essential hypertension - We will continue her antihypertensives.  Dyslipidemia - We will continue statin therapy and Lovaza.  Acute kidney injury superimposed on chronic kidney disease ([place]) - This is likely prerenal due to volume depletion and dehydration. - We will continue hydration with IV normal  saline and follow BMP. - We will avoid nephrotoxins.  Anxiety and depression - We will annual Valium and Lexapro  Overactive bladder - We will continue Ditropan XL.  GERD without esophagitis - We will continue PPI therapy.   DVT prophylaxis: Lovenox.  Advanced Care Planning:  Code Status: full code.  Family Communication:  The plan of care was discussed in details with the patient (and family). I answered all questions. The patient agreed to proceed with the above mentioned plan. Further management will depend upon hospital course. Disposition Plan: Back to previous home environment Consults called: none.  All the records are reviewed and case discussed with ED provider.  Status is: Inpatient  At the time of the admission, it appears that the appropriate admission status for this patient is inpatient.  This is judged to be reasonable and necessary in order to provide  the required intensity of service to ensure the patient's safety given the presenting symptoms, physical exam findings and initial radiographic and laboratory data in the context of comorbid conditions.  The patient requires inpatient status due to high intensity of service, high risk of further deterioration and high frequency of surveillance required.  I certify that at the time of admission, it is my clinical judgment that the patient will require inpatient hospital care extending more than 2 midnights.                            Dispo: The patient is from: Home              Anticipated d/c is to: Home              Patient currently is not medically stable to d/c.              Difficult to place patient: No  Christel Mormon M.D on 07/03/2022 at 5:25 AM  [facility]   From 7 PM-7 AM, contact night-coverage [email or web address]  CC: Primary care physician; Evern Bio, NP

## 2022-07-03 ENCOUNTER — Telehealth: Payer: Self-pay

## 2022-07-03 DIAGNOSIS — J189 Pneumonia, unspecified organism: Secondary | ICD-10-CM

## 2022-07-03 DIAGNOSIS — N189 Chronic kidney disease, unspecified: Secondary | ICD-10-CM | POA: Insufficient documentation

## 2022-07-03 LAB — BASIC METABOLIC PANEL
Anion gap: 12 (ref 5–15)
BUN: 31 mg/dL — ABNORMAL HIGH (ref 8–23)
CO2: 24 mmol/L (ref 22–32)
Calcium: 8.7 mg/dL — ABNORMAL LOW (ref 8.9–10.3)
Chloride: 93 mmol/L — ABNORMAL LOW (ref 98–111)
Creatinine, Ser: 1.32 mg/dL — ABNORMAL HIGH (ref 0.44–1.00)
GFR, Estimated: 40 mL/min — ABNORMAL LOW (ref >60)
Glucose, Bld: 155 mg/dL — ABNORMAL HIGH (ref 70–99)
Potassium: 3.4 mmol/L — ABNORMAL LOW (ref 3.5–5.1)
Sodium: 129 mmol/L — ABNORMAL LOW (ref 135–145)

## 2022-07-03 LAB — CBC
HCT: 33.2 % — ABNORMAL LOW (ref 36.0–46.0)
Hemoglobin: 11 g/dL — ABNORMAL LOW (ref 12.0–15.0)
MCH: 28.9 pg (ref 26.0–34.0)
MCHC: 33.1 g/dL (ref 30.0–36.0)
MCV: 87.4 fL (ref 80.0–100.0)
Platelets: 336 10*3/uL (ref 150–400)
RBC: 3.8 MIL/uL — ABNORMAL LOW (ref 3.87–5.11)
RDW: 13.1 % (ref 11.5–15.5)
WBC: 9.5 10*3/uL (ref 4.0–10.5)
nRBC: 0 % (ref 0.0–0.2)

## 2022-07-03 LAB — TROPONIN I (HIGH SENSITIVITY)
Troponin I (High Sensitivity): 10 ng/L (ref <18)
Troponin I (High Sensitivity): 10 ng/L (ref <18)

## 2022-07-03 LAB — GLUCOSE, CAPILLARY
Glucose-Capillary: 175 mg/dL — ABNORMAL HIGH (ref 70–99)
Glucose-Capillary: 149 mg/dL — ABNORMAL HIGH (ref 70–99)
Glucose-Capillary: 110 mg/dL — ABNORMAL HIGH (ref 70–99)
Glucose-Capillary: 111 mg/dL — ABNORMAL HIGH (ref 70–99)

## 2022-07-03 MED ORDER — INSULIN ASPART 100 UNIT/ML IJ SOLN: 0.0000 [IU] | Freq: Every day | INTRAMUSCULAR | Status: DC

## 2022-07-03 MED ORDER — IPRATROPIUM-ALBUTEROL 0.5-2.5 (3) MG/3ML IN SOLN
3.0000 mL | Freq: Four times a day (QID) | RESPIRATORY_TRACT | Status: DC | PRN
  Administered 2022-07-03: 3 mL via RESPIRATORY_TRACT
  Filled 2022-07-03: qty 3

## 2022-07-03 MED ORDER — ENOXAPARIN SODIUM 40 MG/0.4ML IJ SOSY
40.0000 mg | PREFILLED_SYRINGE | INTRAMUSCULAR | Status: DC
  Administered 2022-07-03 – 2022-07-06 (×4): 40 mg via SUBCUTANEOUS
  Filled 2022-07-03 (×5): qty 0.4

## 2022-07-03 MED ORDER — INSULIN ASPART 100 UNIT/ML IJ SOLN
0.0000 [IU] | Freq: Three times a day (TID) | INTRAMUSCULAR | Status: DC
  Administered 2022-07-03: 3 [IU] via SUBCUTANEOUS
  Administered 2022-07-03 – 2022-07-05 (×3): 2 [IU] via SUBCUTANEOUS
  Filled 2022-07-03 (×4): qty 1

## 2022-07-03 MED ORDER — POTASSIUM CHLORIDE CRYS ER 20 MEQ PO TBCR
40.0000 meq | EXTENDED_RELEASE_TABLET | Freq: Once | ORAL | Status: AC
  Administered 2022-07-03: 40 meq via ORAL
  Filled 2022-07-03: qty 2

## 2022-07-03 NOTE — Telephone Encounter (Signed)
Patient called to let you know that she's in the ED with Bilateral Pneumonia she went to ED yesterday and they admitted her into room 115

## 2022-07-03 NOTE — Evaluation (Addendum)
Clinical/Bedside Swallow Evaluation Patient Details  Name: Hayley Knight MRN: [id number] Date of Birth: Sep 11, 1935  Today's Date: 07/03/2022 Time: SLP Start Time (ACUTE ONLY): 0950 SLP Stop Time (ACUTE ONLY): 1035 SLP Time Calculation (min) (ACUTE ONLY): 45 min  Past Medical History:  Past Medical History:  Diagnosis Date   Arthritis    Diabetes mellitus without complication (HCC)    Type II   Hypertension    Sleep apnea    No Cpap   Past Surgical History:  Past Surgical History:  Procedure Laterality Date   ABDOMINAL HYSTERECTOMY     APPENDECTOMY     CARDIAC CATHETERIZATION     CHOLECYSTECTOMY     DILATION AND CURETTAGE OF UTERUS     MASTECTOMY Right    REPLACEMENT TOTAL KNEE BILATERAL     RIGHT/LEFT HEART CATH AND CORONARY ANGIOGRAPHY Bilateral 07/09/2021   Procedure: RIGHT/LEFT HEART CATH AND CORONARY ANGIOGRAPHY;  Surgeon: Yolonda Kida, MD;  Location: [facility];  Service: Cardiovascular;  Laterality: Bilateral;   TOOTH EXTRACTION N/A 01/02/2022   Procedure: DENTAL RESTORATION/EXTRACTIONS;  Surgeon: Diona Browner, DMD;  Location: [facility];  Service: Oral Surgery;  Laterality: N/A;   HPI:  Pt is a 87 y.o. Caucasian female with medical history significant for Obesity, type 2 diabetes mellitus, hypertension and OSA, who presented to the emergency room with acute onset of worsening dyspnea with associated cough productive of dark green sputum which has been intermittent over the last 4 weeks.  She denies any fever or chills.  She was prescribed amoxicillin by her PCP which she took for 10 days and finished about a couple weeks ago and did not feel better.  She admits to rhinorrhea nasal congestion without earache or sore throat.  Chest CT imagaing: Lungs are well aerated bilaterally. Very mild linear  density is noted in the left lung base. Some associated inspissated  material in the lower lobe bronchial tree is noted new from the  prior exam.  Pt denied any difficulty  swallowing at home, presently.    Assessment / Plan / Recommendation  Clinical Impression   Pt seen for BSE today. Pt awake, verbal and talkative. Pt Denied any trouble swallowing at home, this admit. Followed all instructions. NSG reported no difficulty swallowing pills/meal this morning. On RA; afebrile, WBC WNL.  Pt appears to present w/ functional oropharyngeal phase swallowing w/ No oropharyngeal phase dysphagia noted, No neuromuscular deficits noted. Pt consumed po trials w/ No overt, clinical s/s of aspiration during po trials. Pt appears at reduced risk for aspiration following general aspiration precautions including sitting up to eat/drink and CUTTING foods well d/t Edentulous status at Baseline.   Pt does have challenging factors that could impact her oropharyngeal swallowing to include Edentulous status, fatigue/weakness, and advanced age as well as current hospitalization(eating/drinking in bed). These factors can increase risk for aspiration, dysphagia as well as decreased oral intake overall. Foods should be CUT SMALL d/t lacking Dentition.  OF NOTE: Per past chart Imaging: "Contrast in the distal esophagus, reflux versus delayed transit.".  Pt could be at risk for REFLUX aspiration if lying down too soon post meals/po's. This was discussed w/ MD, NSG.  During po trials, pt consumed all consistencies w/ no overt coughing, decline in vocal quality, or change in respiratory presentation during/post trials. O2 sats remained 97%. Oral phase appeared grossly [facility] w/ timely bolus management, mashing/gumming softened solids, and control of bolus propulsion for A-P transfer for swallowing. Oral clearing achieved w/ all  trial consistencies -- moistened, soft foods given for ease of oral phase (min extra time needed for gumming of foods).  OM Exam appeared [facility] w/ no unilateral weakness noted. Speech Clear. Pt fed self w/ setup support.   Recommend continue a fairly Regular consistency diet w/  well-CUT meats/foods, moistened foods - d/t Edentulous status; Thin liquids. Recommend general aspiration and reflux precautions, sitting Up to eat/drink. Pills in Puree for swallowing IF any difficulty swallowing w/ water noted.  Education given on Pills in Puree; food consistencies and easy to eat options; general aspiration and REFLUX precautions to pt. NSG to reconsult if any new needs arise. NSG updated, agreed. MD updated. Recommend Dietician f/u for support. SLP Visit Diagnosis: Dysphagia, unspecified (R13.10) (Edentulous - min increased time for mashing/gumming of increased textured food)    Aspiration Risk   (reduced following general aspiration precautions)    Diet Recommendation   a fairly Regular consistency diet w/ well-CUT meats/foods, moistened foods - d/t Edentulous status; Thin liquids. Recommend general aspiration and reflux precautions, sitting Up to eat/drink.   Medication Administration: Whole meds with liquid (does well w/ per NSG)    Other  Recommendations Recommended Consults:  (Dietician) Oral Care Recommendations: Oral care BID;Oral care before and after PO;Patient independent with oral care    Recommendations for follow up therapy are one component of a multi-disciplinary discharge planning process, led by the attending physician.  Recommendations may be updated based on patient status, additional functional criteria and insurance authorization.  Follow up Recommendations No SLP follow up      Assistance Recommended at Discharge  PRN  Functional Status Assessment Patient has had a recent decline in their functional status and demonstrates the ability to make significant improvements in function in a reasonable and predictable amount of time.  Frequency and Duration  (n/a)   (n/a)       Prognosis Prognosis for improved oropharyngeal function: Good Barriers to Reach Goals: Time post onset;Severity of deficits Barriers/Prognosis Comment: Edentulous at baseline -  min increased time for mashing/gumming of increased textured food      Swallow Study   General Date of Onset: 07/02/22 HPI: Pt is a 87 y.o. Caucasian female with medical history significant for Obesity, type 2 diabetes mellitus, hypertension and OSA, who presented to the emergency room with acute onset of worsening dyspnea with associated cough productive of dark green sputum which has been intermittent over the last 4 weeks.  She denies any fever or chills.  She was prescribed amoxicillin by her PCP which she took for 10 days and finished about a couple weeks ago and did not feel better.  She admits to rhinorrhea nasal congestion without earache or sore throat.  Chest CT imagaing: Lungs are well aerated bilaterally. Very mild linear  density is noted in the left lung base. Some associated inspissated  material in the lower lobe bronchial tree is noted new from the  prior exam.  Pt denied any difficulty swallowing at home, presently. Type of Study: Bedside Swallow Evaluation Previous Swallow Assessment: none Diet Prior to this Study: Regular;Thin liquids (Level 0) Temperature Spikes Noted: No (wbc 9.5) Respiratory Status: Room air History of Recent Intubation: No Behavior/Cognition: Alert;Cooperative;Pleasant mood Oral Cavity Assessment: Within Functional Limits Oral Care Completed by SLP: Recent completion by staff Oral Cavity - Dentition: Edentulous (does not wear her dentures - teeth pulled last Sept 2023) Vision: Functional for self-feeding Self-Feeding Abilities: Able to feed self;Needs set up Patient Positioning: Upright in bed (supported positioning)  Baseline Vocal Quality: Normal Volitional Cough: Strong Volitional Swallow: Able to elicit    Oral/Motor/Sensory Function Overall Oral Motor/Sensory Function: Within functional limits   Ice Chips Ice chips: Not tested   Thin Liquid Thin Liquid: Within functional limits Presentation: Self Fed;Straw (~6 ozs total) Other Comments: water,  juice, coffee    Nectar Thick Nectar Thick Liquid: Not tested   Honey Thick Honey Thick Liquid: Not tested   Puree Puree: Within functional limits Presentation: Self Fed;Spoon (5+ trials)   Solid     Solid: Within functional limits (min increased time for mashing/gumming of increased textured food) Presentation: Self Fed (5 trials) Other Comments: min increased time for mashing/gumming of increased textured food        Orinda Kenner, MS, [place]; [place] [phone] (ascom) Ernestina Joe 07/03/2022,10:48 AM

## 2022-07-03 NOTE — Assessment & Plan Note (Signed)
>>  ASSESSMENT AND PLAN FOR ACUTE KIDNEY INJURY SUPERIMPOSED ON CHRONIC KIDNEY DISEASE (HCC) WRITTEN ON 07/03/2022 12:07 AM BY MANSY, JAN A, MD  - This is likely prerenal due to volume depletion and dehydration. - We will continue hydration with IV normal saline and follow BMP. - We will avoid nephrotoxins.

## 2022-07-03 NOTE — Progress Notes (Signed)
  Progress Note   Patient: Hayley Knight [id number] DOB: 12-28-1935 DOA: 07/02/2022     1 DOS: the patient was seen and examined on 07/03/2022   Brief hospital course:  Assessment and Plan: * CAP (community acquired pneumonia) - IV NS 100 cc/hr  - IV azithromycin 500 mg daily  - IV ceftriaxone 2 g daily  - Mucinex 600 mg PO bid  - Tessalon 100 mg PO tid PRN   Type 2 diabetes mellitus without complications (HCC) - Novolog SS ACHS  - Duoneb q6 hr PRN  - Linagliptin 5 mg PO daily   Essential hypertension - Norvasc 2.5 mg PO daily   Dyslipidemia - Crestor 20 mg PO daily  - ASA 81 mg PO daily  - Plavix 75 mg PO daily   Acute kidney injury superimposed on chronic kidney disease (HCC) - IV fluids as above   Anxiety and depression - Valium 5 mg PO q12 PRN  - Lexapro 20 mg PO daily   Overactive bladder - Mirabegron ER 25 mg PO daily  - Oxybutynin 10 mg PO daily   GERD without esophagitis - Pepcid 20 mg PO bid  - Protonix 40 mg Po daily   DVT prophylaxis: Lovenox 40 mg sq daily      Subjective: Pt seen and examined at the bedside. Kidney function slowly improving. Clinically, the pt will require more days in the hospital for antibx's.   Physical Exam: Vitals:   07/03/22 0146 07/03/22 0311 07/03/22 0717 07/03/22 0819  BP: (!) 133/57 (!) 133/57  (!) 108/49  Pulse: 73 73  73  Resp: 20 20  16   Temp: 97.9 F (36.6 C) 97.9 F (36.6 C)  97.8 F (36.6 C)  TempSrc: Oral Oral    SpO2: 97%  96% 92%  Weight:  85.2 kg    Height:  5' 0.98" (1.549 m)     Physical Exam HENT:     Head: Normocephalic.  Cardiovascular:     Rate and Rhythm: Normal rate and regular rhythm.  Pulmonary:     Effort: Pulmonary effort is normal.     Breath sounds: Decreased breath sounds present.  Abdominal:     Palpations: Abdomen is soft.  Musculoskeletal:     Cervical back: Neck supple.  Skin:    General: Skin is warm and dry.  Neurological:     Mental Status: She is alert and oriented  to person, place, and time.  Psychiatric:        Mood and Affect: Mood normal.     Data Reviewed:   Disposition: Status is: Inpatient  Planned Discharge Destination: Barriers to discharge: IV antibx and IV fluids at this time     Time spent: 35 minutes  Author: Lucienne Minks , MD 07/03/2022 12:33 PM  For on call review [email or web address].

## 2022-07-03 NOTE — Assessment & Plan Note (Signed)
-   This is likely prerenal due to volume depletion and dehydration. - We will continue hydration with IV normal saline and follow BMP. - We will avoid nephrotoxins.

## 2022-07-04 DIAGNOSIS — J690 Pneumonitis due to inhalation of food and vomit: Secondary | ICD-10-CM

## 2022-07-04 HISTORY — DX: Pneumonitis due to inhalation of food and vomit: J69.0

## 2022-07-04 LAB — CBC
HCT: 30.8 % — ABNORMAL LOW (ref 36.0–46.0)
Hemoglobin: 10.2 g/dL — ABNORMAL LOW (ref 12.0–15.0)
MCH: 29.3 pg (ref 26.0–34.0)
MCHC: 33.1 g/dL (ref 30.0–36.0)
MCV: 88.5 fL (ref 80.0–100.0)
Platelets: 314 10*3/uL (ref 150–400)
RBC: 3.48 MIL/uL — ABNORMAL LOW (ref 3.87–5.11)
RDW: 13.2 % (ref 11.5–15.5)
WBC: 6.6 10*3/uL (ref 4.0–10.5)
nRBC: 0 % (ref 0.0–0.2)

## 2022-07-04 LAB — COMPREHENSIVE METABOLIC PANEL
ALT: 12 U/L (ref 0–44)
AST: 21 U/L (ref 15–41)
Albumin: 3.2 g/dL — ABNORMAL LOW (ref 3.5–5.0)
Alkaline Phosphatase: 33 U/L — ABNORMAL LOW (ref 38–126)
Anion gap: 8 (ref 5–15)
BUN: 24 mg/dL — ABNORMAL HIGH (ref 8–23)
CO2: 24 mmol/L (ref 22–32)
Calcium: 8.8 mg/dL — ABNORMAL LOW (ref 8.9–10.3)
Chloride: 100 mmol/L (ref 98–111)
Creatinine, Ser: 1.2 mg/dL — ABNORMAL HIGH (ref 0.44–1.00)
GFR, Estimated: 44 mL/min — ABNORMAL LOW (ref >60)
Glucose, Bld: 99 mg/dL (ref 70–99)
Potassium: 4.1 mmol/L (ref 3.5–5.1)
Sodium: 132 mmol/L — ABNORMAL LOW (ref 135–145)
Total Bilirubin: 0.3 mg/dL (ref 0.3–1.2)
Total Protein: 6.1 g/dL — ABNORMAL LOW (ref 6.5–8.1)

## 2022-07-04 LAB — HEMOGLOBIN A1C
Hgb A1c MFr Bld: 6.1 % — ABNORMAL HIGH (ref 4.8–5.6)
Mean Plasma Glucose: 128 mg/dL

## 2022-07-04 LAB — GLUCOSE, CAPILLARY
Glucose-Capillary: 100 mg/dL — ABNORMAL HIGH (ref 70–99)
Glucose-Capillary: 126 mg/dL — ABNORMAL HIGH (ref 70–99)
Glucose-Capillary: 111 mg/dL — ABNORMAL HIGH (ref 70–99)
Glucose-Capillary: 104 mg/dL — ABNORMAL HIGH (ref 70–99)

## 2022-07-04 LAB — C-REACTIVE PROTEIN: CRP: <0.5 mg/dL (ref <1.0)

## 2022-07-04 LAB — PHOSPHORUS: Phosphorus: 3.5 mg/dL (ref 2.5–4.6)

## 2022-07-04 LAB — MAGNESIUM: Magnesium: 1.7 mg/dL (ref 1.7–2.4)

## 2022-07-04 MED ORDER — GUAIFENESIN 100 MG/5ML PO LIQD
10.0000 mL | Freq: Four times a day (QID) | ORAL | Status: DC
  Administered 2022-07-04 – 2022-07-05 (×6): 10 mL via ORAL
  Filled 2022-07-04 (×7): qty 10

## 2022-07-04 NOTE — Progress Notes (Signed)
  Progress Note   Patient: Hayley Knight [id number] DOB: 03-01-1936 DOA: 07/02/2022     2 DOS: the patient was seen and examined on 07/04/2022   Brief hospital course:  Assessment and Plan: * CAP (community acquired pneumonia) - IV NS 100 cc/hr  - IV azithromycin 500 mg daily  - IV ceftriaxone 2 g daily  - Mucinex 600 mg PO bid  - Tessalon 100 mg PO tid PRN    Type 2 diabetes mellitus without complications (HCC) - Novolog SS ACHS  - Duoneb q6 hr PRN  - Linagliptin 5 mg PO daily    Essential hypertension - Norvasc 2.5 mg PO daily    Dyslipidemia - Crestor 20 mg PO daily  - ASA 81 mg PO daily  - Plavix 75 mg PO daily    Acute kidney injury superimposed on chronic kidney disease (HCC) - IV fluids as above    Anxiety and depression - Valium 5 mg PO q12 PRN  - Lexapro 20 mg PO daily    Overactive bladder - Mirabegron ER 25 mg PO daily  - Oxybutynin 10 mg PO daily    GERD without esophagitis - Pepcid 20 mg PO bid  - Protonix 40 mg Po daily    DVT prophylaxis: Lovenox 40 mg sq daily       Subjective: Pt seen and examined at the bedside.Kidney function continues to improve. WBC remains wnl, however, the pt is not feeling well enough to go home yet. Continue with antibx's.  Physical Exam: Vitals:   07/03/22 1959 07/03/22 2034 07/04/22 0451 07/04/22 0827  BP:  (!) 115/48 (!) 121/58 (!) 129/51  Pulse:  73 63 71  Resp:  20 18 16   Temp:  98 F (36.7 C) 98 F (36.7 C) 98.4 F (36.9 C)  TempSrc:      SpO2: 95% 97% 97% 96%  Weight:      Height:       HENT:     Head: Normocephalic.  Cardiovascular:     Rate and Rhythm: Normal rate and regular rhythm.  Pulmonary:     Effort: Pulmonary effort is normal.     Breath sounds: Decreased breath sounds present.  Abdominal:     Palpations: Abdomen is soft.  Musculoskeletal:     Cervical back: Neck supple.  Skin:    General: Skin is warm and dry.  Neurological:     Mental Status: She is alert and oriented to  person, place, and time.  Psychiatric:        Mood and Affect: Mood normal.   Data Reviewed:   Disposition: Status is: Inpatient  Planned Discharge Destination: Home    Time spent: 35 minutes  Author: Lucienne Minks , MD 07/04/2022 9:33 AM  For on call review [email or web address].

## 2022-07-05 LAB — COMPREHENSIVE METABOLIC PANEL
ALT: 12 U/L (ref 0–44)
AST: 18 U/L (ref 15–41)
Albumin: 3 g/dL — ABNORMAL LOW (ref 3.5–5.0)
Alkaline Phosphatase: 28 U/L — ABNORMAL LOW (ref 38–126)
Anion gap: 6 (ref 5–15)
BUN: 20 mg/dL (ref 8–23)
CO2: 25 mmol/L (ref 22–32)
Calcium: 8.8 mg/dL — ABNORMAL LOW (ref 8.9–10.3)
Chloride: 107 mmol/L (ref 98–111)
Creatinine, Ser: 0.98 mg/dL (ref 0.44–1.00)
GFR, Estimated: 57 mL/min — ABNORMAL LOW (ref >60)
Glucose, Bld: 91 mg/dL (ref 70–99)
Potassium: 3.9 mmol/L (ref 3.5–5.1)
Sodium: 138 mmol/L (ref 135–145)
Total Bilirubin: 0.3 mg/dL (ref 0.3–1.2)
Total Protein: 5.8 g/dL — ABNORMAL LOW (ref 6.5–8.1)

## 2022-07-05 LAB — CBC
HCT: 29.1 % — ABNORMAL LOW (ref 36.0–46.0)
Hemoglobin: 9.6 g/dL — ABNORMAL LOW (ref 12.0–15.0)
MCH: 29.3 pg (ref 26.0–34.0)
MCHC: 33 g/dL (ref 30.0–36.0)
MCV: 88.7 fL (ref 80.0–100.0)
Platelets: 321 10*3/uL (ref 150–400)
RBC: 3.28 MIL/uL — ABNORMAL LOW (ref 3.87–5.11)
RDW: 13.2 % (ref 11.5–15.5)
WBC: 6 10*3/uL (ref 4.0–10.5)
nRBC: 0 % (ref 0.0–0.2)

## 2022-07-05 LAB — GLUCOSE, CAPILLARY
Glucose-Capillary: 92 mg/dL (ref 70–99)
Glucose-Capillary: 136 mg/dL — ABNORMAL HIGH (ref 70–99)
Glucose-Capillary: 113 mg/dL — ABNORMAL HIGH (ref 70–99)
Glucose-Capillary: 97 mg/dL (ref 70–99)

## 2022-07-05 LAB — MAGNESIUM: Magnesium: 1.6 mg/dL — ABNORMAL LOW (ref 1.7–2.4)

## 2022-07-05 LAB — PHOSPHORUS: Phosphorus: 3.1 mg/dL (ref 2.5–4.6)

## 2022-07-05 LAB — C-REACTIVE PROTEIN: CRP: <0.5 mg/dL (ref <1.0)

## 2022-07-05 MED ORDER — AZITHROMYCIN 500 MG PO TABS
500.0000 mg | ORAL_TABLET | Freq: Every day | ORAL | Status: DC
  Administered 2022-07-05: 500 mg via ORAL
  Filled 2022-07-05: qty 1

## 2022-07-05 MED ORDER — MAGNESIUM SULFATE 4 GM/100ML IV SOLN
4.0000 g | Freq: Once | INTRAVENOUS | Status: AC
  Administered 2022-07-05: 4 g via INTRAVENOUS
  Filled 2022-07-05: qty 100

## 2022-07-05 NOTE — Progress Notes (Signed)
PHARMACIST - PHYSICIAN COMMUNICATION  CONCERNING: Antibiotic IV to Oral Route Change Policy  RECOMMENDATION: This patient is receiving azithromycin by the intravenous route.  Based on criteria approved by the Pharmacy and Therapeutics Committee, the antibiotic(s) is/are being converted to the equivalent oral dose form(s).   DESCRIPTION: These criteria include: Patient being treated for a respiratory tract infection, urinary tract infection, cellulitis or clostridium difficile associated diarrhea if on metronidazole The patient is not neutropenic and does not exhibit a GI malabsorption state The patient is eating (either orally or via tube) and/or has been taking other orally administered medications for a least 24 hours The patient is improving clinically and has a Tmax < 100.5  If you have questions about this conversion, please contact the Pharmacy Department  '[]'$   [phone] )  [place], PharmD, BCPS Clinical Pharmacist 07/05/2022 10:10 AM

## 2022-07-05 NOTE — TOC Initial Note (Signed)
Transition of Care [facility]) - Initial/Assessment Note    Patient Details  Name: Hayley Knight MRN: [id number] Date of Birth: Dec 15, 1935  [facility]) CM/SW Contact:    Gerilyn Pilgrim, LCSW Phone Number: 07/05/2022, 11:46 AM  Clinical Narrative:      CSW spoke with  pt regarding discharge. Pt reports she has a rollator, wheelchair and a hover round at home. She states these were family members and she does not use them but has them if needed. Pt reports she still drives herself to appointments and is living with her daughter. Pt states that she is active with PCP Evern Bio for primary care. Pt reports no additional needs or concerns at this time. Pt plans to discharge home tomorrow.  CSW will continue to follow for discharge needs.                   Patient Goals and CMS Choice            Expected Discharge Plan and Services                                              Prior Living Arrangements/Services                       Activities of Daily Living Home Assistive Devices/Equipment: None ADL Screening (condition at time of admission) Patient's cognitive ability adequate to safely complete daily activities?: Yes Is the patient deaf or have difficulty hearing?: No Does the patient have difficulty seeing, even when wearing glasses/contacts?: No Does the patient have difficulty concentrating, remembering, or making decisions?: No Patient able to express need for assistance with ADLs?: Yes Does the patient have difficulty dressing or bathing?: No Independently performs ADLs?: Yes (appropriate for developmental age) Does the patient have difficulty walking or climbing stairs?: No Weakness of Legs: Both Weakness of Arms/Hands: None  Permission Sought/Granted                  Emotional Assessment              Admission diagnosis:  CAP (community acquired pneumonia) [J18.9] AKI (acute kidney injury) ([place]) [N17.9] Failure of  outpatient treatment [Z78.9] Aspiration pneumonia of left lower lobe, unspecified aspiration pneumonia type ([place]) [J69.0] Patient Active Problem List   Diagnosis Date Noted   Aspiration pneumonia of left lower lobe ([place]) 07/04/2022   Acute kidney injury superimposed on chronic kidney disease ([place]) 07/03/2022   CAP (community acquired pneumonia) 07/02/2022   Essential hypertension 07/02/2022   Dyslipidemia 07/02/2022   Type 2 diabetes mellitus without complications ([place]) [id number]   GERD without esophagitis 07/02/2022   Overactive bladder 07/02/2022   Anxiety and depression 07/02/2022   Viral upper respiratory tract infection 06/15/2022   Fatigue due to exposure 06/15/2022   Cough 10/21/2021   Acute renal failure superimposed on stage 3 chronic kidney disease ([place]) 10/21/2021   Hyponatremia 10/21/2021   SOB (shortness of breath) 10/21/2021   Anemia 10/21/2021   Risk for falls 10/21/2021   Dyspnea 07/09/2021   Anxiety 05/09/2018   Diabetes mellitus type 2, uncomplicated ([place]) 0000000   GERD (gastroesophageal reflux disease) 05/09/2018   SI joint arthritis 11/08/2017   Chronic bilateral low back pain with bilateral sciatica 11/08/2017   Lumbar facet arthropathy 11/08/2017   Spondylosis without myelopathy or radiculopathy, lumbar region 11/08/2017  Lumbar degenerative disc disease 11/08/2017   Class 2 severe obesity due to excess calories with serious comorbidity and body mass index (BMI) of 37.0 to 37.9 in adult [facility]) 11/08/2017   CAD (coronary artery disease) 05/23/2013   Jaw pain 02/15/2013   Shoulder bursitis 02/15/2013   Left shoulder pain 09/30/2012   Narcotic dependence, episodic use ([place]) 09/30/2012   Fibromyalgia 08/31/2012   OA (osteoarthritis) 08/31/2012   Benign essential hypertension 10/12/2006   PCP:  Evern Bio, NP Pharmacy:   [id number] [place] [place] [address] [id number]: [phone]:  [phone]    Social Determinants of Health (SDOH) Social History: SDOH Screenings   Food Insecurity: No Food Insecurity (07/03/2022)  Housing: Low Risk  (07/03/2022)  Transportation Needs: No Transportation Needs (07/03/2022)  Utilities: Not At Risk (07/03/2022)  Tobacco Use: Low Risk  (07/02/2022)   SDOH Interventions:     Readmission Risk Interventions    07/05/2022   11:46 AM  Readmission Risk Prevention Plan  Transportation Screening Complete  PCP or Specialist Appt within 3-5 Days Complete

## 2022-07-05 NOTE — Progress Notes (Signed)
Mobility Specialist - Progress Note   07/05/22 1008  Mobility  Activity Ambulated independently in hallway;Stood at bedside;Dangled on edge of bed  Level of Assistance Independent  Assistive Device Front wheel walker  Distance Ambulated (ft) 220 ft  Activity Response Tolerated well  Mobility Referral Yes  $Mobility charge 1 Mobility   Pt sitting in recliner on RA upon arrival. Pt STS and ambulates in hallway indep with not LOB noted. Pt returns to recliner with needs in reach.   Gretchen Short  Mobility Specialist  07/05/22 10:09 AM

## 2022-07-05 NOTE — Progress Notes (Signed)
  Progress Note   Patient: Hayley Knight [id number]:[id number] DOB: 19-Nov-1935 DOA: 07/02/2022     3 DOS: the patient was seen and examined on 07/05/2022   Brief hospital course:  Assessment and Plan: * CAP (community acquired pneumonia) - IV azithromycin 500 mg daily  - IV ceftriaxone 2 g daily  - Mucinex 600 mg PO bid  - Tessalon 100 mg PO tid PRN    Type 2 diabetes mellitus without complications (HCC) - Novolog SS ACHS  - Duoneb q6 hr PRN  - Linagliptin 5 mg PO daily    Essential hypertension - Norvasc 2.5 mg PO daily    Dyslipidemia - Crestor 20 mg PO daily  - ASA 81 mg PO daily  - Plavix 75 mg PO daily    Acute kidney injury superimposed on chronic kidney disease (HCC) - Resolved   Anxiety and depression - Valium 5 mg PO q12 PRN  - Lexapro 20 mg PO daily    Overactive bladder - Mirabegron ER 25 mg PO daily  - Oxybutynin 10 mg PO daily    GERD without esophagitis - Pepcid 20 mg PO bid  - Protonix 40 mg PO daily    DVT prophylaxis: Lovenox 40 mg sq daily        Subjective: Pt seen and examined at the bedside. AKI has resolved today. IV fluids stopped. Respiratory exam is improving. She is hopeful to be discharge tmr morning.  Physical Exam: Vitals:   07/04/22 1523 07/04/22 1950 07/05/22 0505 07/05/22 0801  BP: 115/60 133/63 119/68 (!) 117/49  Pulse: 62 73 69 72  Resp: 16 20 18 16   Temp: 98.8 F (37.1 C) 97.8 F (36.6 C) 99 F (37.2 C) 98.4 F (36.9 C)  TempSrc:    Oral  SpO2: 96% 98% 95% 95%  Weight:      Height:       HENT:     Head: Normocephalic.  Cardiovascular:     Rate and Rhythm: Normal rate and regular rhythm.  Pulmonary:     Effort: Pulmonary effort is normal.     Breath sounds: Improved aeration b/l Abdominal:     Palpations: Abdomen is soft.  Musculoskeletal:     Cervical back: Neck supple.  Skin:    General: Skin is warm and dry.  Neurological:     Mental Status: She is alert and oriented to person, place, and time.  Psychiatric:         Mood and Affect: Mood normal.   Data Reviewed:   Disposition: Status is: Inpatient  Planned Discharge Destination: Home    Time spent: 35 minutes  Author: Lucienne Minks , MD 07/05/2022 9:33 AM  For on call review [email or web address].

## 2022-07-05 NOTE — Progress Notes (Signed)
Mobility Specialist - Progress Note     07/05/22 1630  Mobility  Activity Ambulated independently to bathroom  Level of Assistance Independent  Assistive Device Front wheel walker  Distance Ambulated (ft) 10 ft  Activity Response Tolerated well  Mobility Referral Yes  $Mobility charge 1 Mobility   Author responds to bed alarm. Pt STS and ambulates to bathroom indep. Pt returned to EOB and left with needs in reach.  Loma Sender Mobility Specialist 07/05/22, 4:33 PM

## 2022-07-06 LAB — COMPREHENSIVE METABOLIC PANEL WITH GFR
ALT: 11 U/L (ref 0–44)
AST: 20 U/L (ref 15–41)
Albumin: 3.1 g/dL — ABNORMAL LOW (ref 3.5–5.0)
Alkaline Phosphatase: 31 U/L — ABNORMAL LOW (ref 38–126)
Anion gap: 6 (ref 5–15)
BUN: 15 mg/dL (ref 8–23)
CO2: 26 mmol/L (ref 22–32)
Calcium: 8.7 mg/dL — ABNORMAL LOW (ref 8.9–10.3)
Chloride: 105 mmol/L (ref 98–111)
Creatinine, Ser: 1.1 mg/dL — ABNORMAL HIGH (ref 0.44–1.00)
GFR, Estimated: 49 mL/min — ABNORMAL LOW (ref >60)
Glucose, Bld: 111 mg/dL — ABNORMAL HIGH (ref 70–99)
Potassium: 4.1 mmol/L (ref 3.5–5.1)
Sodium: 137 mmol/L (ref 135–145)
Total Bilirubin: 0.4 mg/dL (ref 0.3–1.2)
Total Protein: 6 g/dL — ABNORMAL LOW (ref 6.5–8.1)

## 2022-07-06 LAB — C-REACTIVE PROTEIN: CRP: 0.6 mg/dL (ref <1.0)

## 2022-07-06 LAB — CBC
HCT: 30.5 % — ABNORMAL LOW (ref 36.0–46.0)
Hemoglobin: 9.8 g/dL — ABNORMAL LOW (ref 12.0–15.0)
MCH: 28.7 pg (ref 26.0–34.0)
MCHC: 32.1 g/dL (ref 30.0–36.0)
MCV: 89.4 fL (ref 80.0–100.0)
Platelets: 327 K/uL (ref 150–400)
RBC: 3.41 MIL/uL — ABNORMAL LOW (ref 3.87–5.11)
RDW: 13.3 % (ref 11.5–15.5)
WBC: 6.5 K/uL (ref 4.0–10.5)
nRBC: 0 % (ref 0.0–0.2)

## 2022-07-06 LAB — GLUCOSE, CAPILLARY
Glucose-Capillary: 101 mg/dL — ABNORMAL HIGH (ref 70–99)
Glucose-Capillary: 97 mg/dL (ref 70–99)

## 2022-07-06 LAB — PHOSPHORUS: Phosphorus: 2.9 mg/dL (ref 2.5–4.6)

## 2022-07-06 LAB — MAGNESIUM: Magnesium: 2.4 mg/dL (ref 1.7–2.4)

## 2022-07-06 MED ORDER — HYDROCOD POLI-CHLORPHE POLI ER 10-8 MG/5ML PO SUER: 5.0000 mL | Freq: Two times a day (BID) | ORAL | 0 refills | Status: DC | PRN

## 2022-07-06 MED ORDER — AMOXICILLIN-POT CLAVULANATE 875-125 MG PO TABS: 1.0000 | ORAL_TABLET | Freq: Two times a day (BID) | ORAL | 0 refills | Status: DC

## 2022-07-06 NOTE — Progress Notes (Signed)
Received Md order to discharge patient to home.  I reviewed discharge instructions, prescriptions, home meds and follow up appointments with patient and patient verbalized understanding.  MD advised patient against driving home but patient insisted on driving home herself, MD notified and aware.

## 2022-07-06 NOTE — Discharge Summary (Addendum)
Hayley Knight [id number] DOB: December 01, 1935 DOA: 07/02/2022  PCP: Evern Bio, NP  Admit date: 07/02/2022 Discharge date: 07/06/2022  Time spent: 35 minutes  Recommendations for Outpatient Follow-up:  Pcp f/u 1 weeks     Discharge Diagnoses:  Principal Problem:   CAP (community acquired pneumonia) Active Problems:   Type 2 diabetes mellitus without complications ([place])   Essential hypertension   Dyslipidemia   GERD without esophagitis   Overactive bladder   Anxiety and depression   Acute kidney injury superimposed on chronic kidney disease ([place])   Aspiration pneumonia of left lower lobe ([place])   Discharge Condition: improved  Diet recommendation: heart healthy  Filed Weights   07/03/22 0311  Weight: 85.2 kg    History of present illness:  From admission h and p Hayley Knight is a 87 y.o. Caucasian female with medical history significant for type 2 diabetes mellitus, hypertension and OSA, who presented to the emergency room with acute onset of worsening dyspnea with associated cough productive of dark green sputum which has been intermittent over the last 4 weeks.  She denies any fever or chills.  She was prescribed amoxicillin by her PCP which she took for 10 days and finished about a couple weeks ago and did not feel better.  She admits to rhinorrhea nasal congestion without earache or sore throat.  No nausea or vomiting or abdominal pain.  No chest pain or palpitations.  No dysuria, oliguria or hematuria or flank pain.  No bleeding diathesis.   Hospital Course:  Patient presents with cough and shortness of breath after failing outpatient treatment with amoxicillin and prednisone. Here she was treated with ceftriaxone and azithromycin and she reports robust improvement in symptoms, wheezing resolved, breathing comfortably on room air, ambulating without difficulty. Covid/flu negative. Will send home with augmentin to complete a 7 day course. Mild AKI resolved and patient  is tolerating PO. She insists on driving herself home despite my recommendation that we either arrange transport of family do so.   Procedures: none   Consultations: none  Discharge Exam: Vitals:   07/06/22 0414 07/06/22 0814  BP: 137/66 (!) 159/69  Pulse: 63 73  Resp: 16 16  Temp: 97.8 F (36.6 C) 98 F (36.7 C)  SpO2: 95% 93%    General: NAD Cardiovascular: RRR Respiratory: CTAB  Discharge Instructions   Discharge Instructions     Diet - low sodium heart healthy   Complete by: As directed    Increase activity slowly   Complete by: As directed       Allergies as of 07/06/2022       Reactions   Iodine Swelling   Ivp Dye [iodinated Contrast Media] Swelling   Sulfa Antibiotics Swelling        Medication List     STOP taking these medications    amoxicillin 875 MG tablet Commonly known as: AMOXIL       TAKE these medications    albuterol 108 (90 Base) MCG/ACT inhaler Commonly known as: VENTOLIN HFA Inhale 2 puffs into the lungs every 6 (six) hours as needed for wheezing or shortness of breath.   amLODipine 2.5 MG tablet Commonly known as: NORVASC Take 2.5 mg by mouth daily.   amoxicillin-clavulanate 875-125 MG tablet Commonly known as: AUGMENTIN Take 1 tablet by mouth 2 (two) times daily.   aspirin EC 81 MG tablet Take 81 mg by mouth at bedtime.   benzonatate 100 MG capsule Commonly known as: Tessalon Perles Take 1 capsule (100  mg total) by mouth 3 (three) times daily as needed for cough.   chlorpheniramine-HYDROcodone 10-8 MG/5ML Commonly known as: TUSSIONEX Take 5 mLs by mouth every 12 (twelve) hours as needed for cough.   ciprofloxacin 0.3 % ophthalmic solution Commonly known as: CILOXAN Place 1 drop into both eyes 2 (two) times daily as needed (red eye).   clopidogrel 75 MG tablet Commonly known as: PLAVIX Take 75 mg by mouth daily.   diazepam 5 MG tablet Commonly known as: VALIUM Take 5 mg by mouth every 12 (twelve) hours as  needed for anxiety.   escitalopram 20 MG tablet Commonly known as: LEXAPRO Take 20 mg by mouth daily.   famotidine 20 MG tablet Commonly known as: PEPCID Take 20 mg by mouth 2 (two) times daily.   fenofibrate 145 MG tablet Commonly known as: TRICOR Take 145 mg by mouth at bedtime.   fluticasone 0.05 % cream Commonly known as: CUTIVATE Apply 1 application. topically daily as needed for rash.   furosemide 40 MG tablet Commonly known as: LASIX Take 40 mg by mouth daily.   gabapentin 300 MG capsule Commonly known as: NEURONTIN Take 300 mg by mouth 2 (two) times daily as needed (Neck pain).   Gemtesa 75 MG Tabs Generic drug: Vibegron Take 1 tablet by mouth daily.   hydrochlorothiazide 12.5 MG tablet Commonly known as: HYDRODIURIL TAKE 1 TABLET BY MOUTH EVERY DAY IN THE MORNING   losartan 100 MG tablet Commonly known as: COZAAR Take 100 mg by mouth daily.   Melatonin 10 MG Tabs Take 10 mg by mouth at bedtime.   multivitamin capsule Take 2 capsules by mouth daily.   nitroGLYCERIN 2 % ointment Commonly known as: NITROGLYN Apply 0.5 inches topically every 6 (six) hours.   nitroGLYCERIN 0.4 MG SL tablet Commonly known as: NITROSTAT Place 0.4 mg under the tongue every 5 (five) minutes as needed for chest pain.   omega-3 acid ethyl esters 1 g capsule Commonly known as: LOVAZA Take 2 g by mouth 2 (two) times daily.   omeprazole 40 MG capsule Commonly known as: PRILOSEC Take 1 capsule (40 mg total) by mouth daily.   ONE A DAY IMMUNITY DEFENSE PO Take 2 tablets by mouth daily.   oxybutynin 10 MG 24 hr tablet Commonly known as: DITROPAN-XL Take 1 tablet (10 mg total) by mouth daily.   oxyCODONE 10 mg 12 hr tablet Commonly known as: OXYCONTIN Take 20 mg by mouth every 12 (twelve) hours.   potassium chloride 10 MEQ tablet Commonly known as: KLOR-CON TAKE 1 TABLET BY MOUTH IN THE MORNING WITH FUROSEMIDE   predniSONE 5 MG tablet Commonly known as:  DELTASONE Take 1 tablet (5 mg total) by mouth in the morning and at bedtime. What changed: Another medication with the same name was removed. Continue taking this medication, and follow the directions you see here.   rosuvastatin 20 MG tablet Commonly known as: CRESTOR Take 20 mg by mouth at bedtime.   sitaGLIPtin 100 MG tablet Commonly known as: JANUVIA Take 100 mg by mouth daily.   Vitamin D3 125 MCG (5000 UT) capsule Generic drug: Cholecalciferol Take 1 capsule (5,000 Units total) by mouth daily.       Allergies  Allergen Reactions   Iodine Swelling   Ivp Dye [Iodinated Contrast Media] Swelling   Sulfa Antibiotics Swelling    Follow-up Information     Evern Bio, NP Follow up.   Specialty: Nurse Practitioner Why: 1-2 weeks Contact information: [place]  [place] [address] [phone]                  The results of significant diagnostics from this hospitalization (including imaging, microbiology, ancillary and laboratory) are listed below for reference.    Significant Diagnostic Studies: CT Chest Wo Contrast  Result Date: 07/02/2022 CLINICAL DATA:  Increased shortness of breath EXAM: CT CHEST WITHOUT CONTRAST TECHNIQUE: Multidetector CT imaging of the chest was performed following the standard protocol without IV contrast. RADIATION DOSE REDUCTION: This exam was performed according to the departmental dose-optimization program which includes automated exposure control, adjustment of the mA and/or kV according to patient size and/or use of iterative reconstruction technique. COMPARISON:  Chest x-ray from earlier in the same day, CT from 10/21/2021. FINDINGS: Cardiovascular: Limited due to lack of IV contrast. Atherosclerotic calcifications are noted. No aneurysmal dilatation is seen. Heart is not significantly enlarged. Coronary calcifications are noted. Mediastinum/Nodes: Thoracic inlet is within normal limits. No hilar or mediastinal adenopathy is  noted. The esophagus as visualized is within normal limits. Lungs/Pleura: Lungs are well aerated bilaterally. Very mild linear density is noted in the left lung base. Some associated inspissated material in the lower lobe bronchial tree is noted new from the prior exam. No focal parenchymal nodule is seen. No infiltrate is seen. Upper Abdomen: Visualized upper abdomen shows a partially calcified hypodense lesion posteriorly in the right lobe of the liver stable in appearance from the prior exam. This is stable dating back to 2009. Gallbladder has been surgically removed. Musculoskeletal: Degenerative changes of the thoracic spine are noted. No acute rib abnormality is seen. IMPRESSION: Mild linear density in the left lower lobe medially with some associated inspissated material within the lower lobe bronchial tree. This likely represents some mild aspiration. Stable hypodense lesion with calcification the posterior aspect of the right lobe of the liver dating back to 2009. This is almost certainly benign in etiology given its long-term stability. Aortic Atherosclerosis (ICD10-I70.0). Electronically Signed   By: Inez Catalina M.D.   On: 07/02/2022 22:32   DG Chest 2 View  Result Date: 07/02/2022 CLINICAL DATA:  SOB EXAM: CHEST - 2 VIEW COMPARISON:  11/07/2021 FINDINGS: The heart size and mediastinal contours are within normal limits. There is diffuse pulmonary interstitial prominence which could be seen with atypical infection, edema or interstitial lung disease. There is no focal consolidation. No pneumothorax or pleural effusion. There are thoracic degenerative changes. IMPRESSION: Nonspecific interstitial prominence consistent with atypical infection, edema or interstitial lung disease. No focal consolidation. Electronically Signed   By: Sammie Bench M.D.   On: 07/02/2022 14:57    Microbiology: Recent Results (from the past 240 hour(s))  Resp panel by RT-PCR (RSV, Flu A&B, Covid) Anterior Nasal Swab      Status: None   Collection Time: 07/02/22 10:05 PM   Specimen: Anterior Nasal Swab  Result Value Ref Range Status   SARS Coronavirus 2 by RT PCR NEGATIVE NEGATIVE Final    Comment: (NOTE) SARS-CoV-2 target nucleic acids are NOT DETECTED.  The SARS-CoV-2 RNA is generally detectable in upper respiratory specimens during the acute phase of infection. The lowest concentration of SARS-CoV-2 viral copies this assay can detect is 138 copies/mL. A negative result does not preclude SARS-Cov-2 infection and should not be used as the sole basis for treatment or other patient management decisions. A negative result may occur with  improper specimen collection/handling, submission of specimen other than nasopharyngeal swab, presence of viral mutation(s) within the areas targeted by this assay,  and inadequate number of viral copies(<138 copies/mL). A negative result must be combined with clinical observations, patient history, and epidemiological information. The expected result is Negative.  Fact Sheet for Patients:  EntrepreneurPulse.com.au  Fact Sheet for Healthcare Providers:  IncredibleEmployment.be  This test is no t yet approved or cleared by the [place] FDA and  has been authorized for detection and/or diagnosis of SARS-CoV-2 by FDA under an Emergency Use Authorization (EUA). This EUA will remain  in effect (meaning this test can be used) for the duration of the COVID-19 declaration under Section 564(b)(1) of the Act, 21 U.S.C.section 360bbb-3(b)(1), unless the authorization is terminated  or revoked sooner.       Influenza A by PCR NEGATIVE NEGATIVE Final   Influenza B by PCR NEGATIVE NEGATIVE Final    Comment: (NOTE) The Xpert Xpress SARS-CoV-2/FLU/RSV plus assay is intended as an aid in the diagnosis of influenza from Nasopharyngeal swab specimens and should not be used as a sole basis for treatment. Nasal washings and aspirates are  unacceptable for Xpert Xpress SARS-CoV-2/FLU/RSV testing.  Fact Sheet for Patients: EntrepreneurPulse.com.au  Fact Sheet for Healthcare Providers: IncredibleEmployment.be  This test is not yet approved or cleared by the [place] FDA and has been authorized for detection and/or diagnosis of SARS-CoV-2 by FDA under an Emergency Use Authorization (EUA). This EUA will remain in effect (meaning this test can be used) for the duration of the COVID-19 declaration under Section 564(b)(1) of the Act, 21 U.S.C. section 360bbb-3(b)(1), unless the authorization is terminated or revoked.     Resp Syncytial Virus by PCR NEGATIVE NEGATIVE Final    Comment: (NOTE) Fact Sheet for Patients: EntrepreneurPulse.com.au  Fact Sheet for Healthcare Providers: IncredibleEmployment.be  This test is not yet approved or cleared by the [place] FDA and has been authorized for detection and/or diagnosis of SARS-CoV-2 by FDA under an Emergency Use Authorization (EUA). This EUA will remain in effect (meaning this test can be used) for the duration of the COVID-19 declaration under Section 564(b)(1) of the Act, 21 U.S.C. section 360bbb-3(b)(1), unless the authorization is terminated or revoked.  Performed at [facility], [address] [place], [place] [address]      Labs: Basic Metabolic Panel: Recent Labs  Lab 07/02/22 1423 07/03/22 0219 07/04/22 0517 07/05/22 0457 07/06/22 0540  NA 134* 129* 132* 138 137  K 3.8 3.4* 4.1 3.9 4.1  CL 96* 93* 100 107 105  CO2 '25 24 24 25 26  '$ GLUCOSE 129* 155* 99 91 111*  BUN 33* 31* 24* 20 15  CREATININE 1.61* 1.32* 1.20* 0.98 1.10*  CALCIUM 9.2 8.7* 8.8* 8.8* 8.7*  MG  --   --  1.7 1.6* 2.4  PHOS  --   --  3.5 3.1 2.9   Liver Function Tests: Recent Labs  Lab 07/04/22 0517 07/05/22 0457 07/06/22 0540  AST '21 18 20  '$ ALT '12 12 11  '$ ALKPHOS 33* 28* 31*  BILITOT  0.3 0.3 0.4  PROT 6.1* 5.8* 6.0*  ALBUMIN 3.2* 3.0* 3.1*   No results for input(s): "LIPASE", "AMYLASE" in the last 168 hours. No results for input(s): "AMMONIA" in the last 168 hours. CBC: Recent Labs  Lab 07/02/22 1423 07/03/22 0219 07/04/22 0517 07/05/22 0457 07/06/22 0540  WBC 8.9 9.5 6.6 6.0 6.5  HGB 11.7* 11.0* 10.2* 9.6* 9.8*  HCT 36.3 33.2* 30.8* 29.1* 30.5*  MCV 88.8 87.4 88.5 88.7 89.4  PLT 384 336 314 321 327   Cardiac Enzymes: No results  for input(s): "CKTOTAL", "CKMB", "CKMBINDEX", "TROPONINI" in the last 168 hours. BNP: BNP (last 3 results) Recent Labs    10/21/21 1928 07/02/22 1423  BNP 258.8* 15.3    ProBNP (last 3 results) No results for input(s): "PROBNP" in the last 8760 hours.  CBG: Recent Labs  Lab 07/05/22 1157 07/05/22 1603 07/05/22 2021 07/06/22 0802 07/06/22 1139  GLUCAP 136* 113* 97 101* 97       Signed:  Desma Maxim MD.  [facility] 07/06/2022, 2:35 PM

## 2022-07-06 NOTE — Care Management Important Message (Signed)
Important Message  Patient Details  Name: Hayley Knight MRN: [id number] Date of Birth: [birth year]   Medicare Important Message Given:  Yes     Dannette Barbara 07/06/2022, 11:48 AM

## 2022-07-21 ENCOUNTER — Ambulatory Visit (INDEPENDENT_AMBULATORY_CARE_PROVIDER_SITE_OTHER): Payer: Medicare Other | Admitting: Nurse Practitioner

## 2022-07-21 ENCOUNTER — Encounter: Payer: Self-pay | Admitting: Nurse Practitioner

## 2022-07-21 VITALS — BP 134/66 | HR 92 | Ht 60.0 in | Wt 194.8 lb

## 2022-07-21 DIAGNOSIS — J189 Pneumonia, unspecified organism: Secondary | ICD-10-CM

## 2022-07-21 DIAGNOSIS — R051 Acute cough: Secondary | ICD-10-CM

## 2022-07-21 DIAGNOSIS — K219 Gastro-esophageal reflux disease without esophagitis: Secondary | ICD-10-CM

## 2022-07-21 DIAGNOSIS — E119 Type 2 diabetes mellitus without complications: Secondary | ICD-10-CM | POA: Diagnosis not present

## 2022-07-21 DIAGNOSIS — I10 Essential (primary) hypertension: Secondary | ICD-10-CM

## 2022-07-21 NOTE — Patient Instructions (Signed)
1)  Chest CT without contrast to follow up from hospital PNA found only on chest CT and not on CXR 2) Follow up appt in 2 weeks

## 2022-07-21 NOTE — Progress Notes (Signed)
Established Patient Office Visit  Subjective:  Patient ID: Hayley Knight, female    DOB: 04/21/1936  Age: 87 y.o. MRN: [id number]  Chief Complaint  Patient presents with   Follow-up    4 month follow up, review lab results.    Hospital follow up, patient reports she had PNA.  Pt is not coughing and does not feel fatigued, was in the hospital for 4 days.  Pt needs repeat Chest CT - reports CXR for pneumonia.    No other concerns at this time.   Past Medical History:  Diagnosis Date   Arthritis    Diabetes mellitus without complication ([place])    Type II   Hypertension    Sleep apnea    No Cpap    Past Surgical History:  Procedure Laterality Date   ABDOMINAL HYSTERECTOMY     APPENDECTOMY     CARDIAC CATHETERIZATION     CHOLECYSTECTOMY     DILATION AND CURETTAGE OF UTERUS     MASTECTOMY Right    REPLACEMENT TOTAL KNEE BILATERAL     RIGHT/LEFT HEART CATH AND CORONARY ANGIOGRAPHY Bilateral 07/09/2021   Procedure: RIGHT/LEFT HEART CATH AND CORONARY ANGIOGRAPHY;  Surgeon: Yolonda Kida, MD;  Location: [facility];  Service: Cardiovascular;  Laterality: Bilateral;   TOOTH EXTRACTION N/A 01/02/2022   Procedure: DENTAL RESTORATION/EXTRACTIONS;  Surgeon: Diona Browner, DMD;  Location: [facility];  Service: Oral Surgery;  Laterality: N/A;    Social History   Socioeconomic History   Marital status: Widowed    Spouse name: Not on file   Number of children: Not on file   Years of education: Not on file   Highest education level: Not on file  Occupational History   Not on file  Tobacco Use   Smoking status: Never    Passive exposure: Never   Smokeless tobacco: Never  Vaping Use   Vaping Use: Never used  Substance and Sexual Activity   Alcohol use: No   Drug use: No   Sexual activity: Not on file  Other Topics Concern   Not on file  Social History Narrative   Not on file   Social Determinants of Health   Financial Resource Strain: Not on file  Food  Insecurity: No Food Insecurity (07/03/2022)   Hunger Vital Sign    Worried About Running Out of Food in the Last Year: Never true    Ran Out of Food in the Last Year: Never true  Transportation Needs: No Transportation Needs (07/03/2022)   PRAPARE - Hydrologist (Medical): No    Lack of Transportation (Non-Medical): No  Physical Activity: Not on file  Stress: Not on file  Social Connections: Not on file  Intimate Partner Violence: Not At Risk (07/03/2022)   Humiliation, Afraid, Rape, and Kick questionnaire    Fear of Current or Ex-Partner: No    Emotionally Abused: No    Physically Abused: No    Sexually Abused: No    No family history on file.  Allergies  Allergen Reactions   Iodine Swelling   Ivp Dye [Iodinated Contrast Media] Swelling   Sulfa Antibiotics Swelling    Review of Systems  Constitutional:  Positive for malaise/fatigue.  HENT: Negative.    Eyes: Negative.   Respiratory:  Positive for sputum production.   Cardiovascular: Negative.   Gastrointestinal: Negative.   Genitourinary: Negative.   Musculoskeletal:  Positive for back pain, joint pain and myalgias.  Skin: Negative.  Neurological: Negative.   Endo/Heme/Allergies: Negative.   Psychiatric/Behavioral: Negative.         Objective:   BP 134/66   Pulse 92   Ht 5' (1.524 m)   Wt 194 lb 12.8 oz (88.4 kg)   SpO2 92%   BMI 38.04 kg/m   Vitals:   07/21/22 1427  BP: 134/66  Pulse: 92  Height: 5' (1.524 m)  Weight: 194 lb 12.8 oz (88.4 kg)  SpO2: 92%  BMI (Calculated): 38.04    Physical Exam Vitals reviewed.  Constitutional:      Appearance: Normal appearance.  HENT:     Head: Normocephalic.     Nose: Nose normal.     Mouth/Throat:     Mouth: Mucous membranes are moist.  Eyes:     Pupils: Pupils are equal, round, and reactive to light.  Cardiovascular:     Rate and Rhythm: Normal rate and regular rhythm.  Pulmonary:     Effort: Pulmonary effort is normal.      Breath sounds: Rhonchi present.  Abdominal:     General: Bowel sounds are normal.     Palpations: Abdomen is soft.  Musculoskeletal:        General: Normal range of motion.     Cervical back: Normal range of motion and neck supple.  Skin:    General: Skin is warm and dry.  Neurological:     Mental Status: She is alert and oriented to person, place, and time.  Psychiatric:        Mood and Affect: Mood normal.        Behavior: Behavior normal.      No results found for any visits on 07/21/22.  Recent Results (from the past 2160 hour(s))  Basic metabolic panel     Status: Abnormal   Collection Time: 07/02/22  2:23 PM  Result Value Ref Range   Sodium 134 (L) 135 - 145 mmol/L   Potassium 3.8 3.5 - 5.1 mmol/L   Chloride 96 (L) 98 - 111 mmol/L   CO2 25 22 - 32 mmol/L   Glucose, Bld 129 (H) 70 - 99 mg/dL    Comment: Glucose reference range applies only to samples taken after fasting for at least 8 hours.   BUN 33 (H) 8 - 23 mg/dL   Creatinine, Ser 1.61 (H) 0.44 - 1.00 mg/dL   Calcium 9.2 8.9 - 10.3 mg/dL   GFR, Estimated 31 (L) >60 mL/min    Comment: (NOTE) Calculated using the CKD-EPI Creatinine Equation (2021)    Anion gap 13 5 - 15    Comment: Performed at [facility], [address] [place], [place] [address]  CBC     Status: Abnormal   Collection Time: 07/02/22  2:23 PM  Result Value Ref Range   WBC 8.9 4.0 - 10.5 K/uL   RBC 4.09 3.87 - 5.11 MIL/uL   Hemoglobin 11.7 (L) 12.0 - 15.0 g/dL   HCT 36.3 36.0 - 46.0 %   MCV 88.8 80.0 - 100.0 fL   MCH 28.6 26.0 - 34.0 pg   MCHC 32.2 30.0 - 36.0 g/dL   RDW 13.1 11.5 - 15.5 %   Platelets 384 150 - 400 K/uL   nRBC 0.0 0.0 - 0.2 %    Comment: Performed at [facility], [address], [place],  [address]  Troponin I (High Sensitivity)     Status: None   Collection Time: 07/02/22  2:23 PM  Result Value Ref Range  Troponin I (High Sensitivity) 8 <18 ng/L    Comment: (NOTE) Elevated high  sensitivity troponin I (hsTnI) values and significant  changes across serial measurements may suggest ACS but many other  chronic and acute conditions are known to elevate hsTnI results.  Refer to the "Links" section for chest pain algorithms and additional  guidance. Performed at [facility], [address] [place], [place] [address]   Brain natriuretic peptide     Status: None   Collection Time: 07/02/22  2:23 PM  Result Value Ref Range   B Natriuretic Peptide 15.3 0.0 - 100.0 pg/mL    Comment: Performed at [facility] [address] [place], [place] [address]  Resp panel by RT-PCR (RSV, Flu A&B, Covid) Anterior Nasal Swab     Status: None   Collection Time: 07/02/22 10:05 PM   Specimen: Anterior Nasal Swab  Result Value Ref Range   SARS Coronavirus 2 by RT PCR NEGATIVE NEGATIVE    Comment: (NOTE) SARS-CoV-2 target nucleic acids are NOT DETECTED.  The SARS-CoV-2 RNA is generally detectable in upper respiratory specimens during the acute phase of infection. The lowest concentration of SARS-CoV-2 viral copies this assay can detect is 138 copies/mL. A negative result does not preclude SARS-Cov-2 infection and should not be used as the sole basis for treatment or other patient management decisions. A negative result may occur with  improper specimen collection/handling, submission of specimen other than nasopharyngeal swab, presence of viral mutation(s) within the areas targeted by this assay, and inadequate number of viral copies(<138 copies/mL). A negative result must be combined with clinical observations, patient history, and epidemiological information. The expected result is Negative.  Fact Sheet for Patients:  EntrepreneurPulse.com.au  Fact Sheet for Healthcare Providers:  IncredibleEmployment.be  This test is no t yet approved or cleared by the [place] FDA and  has been authorized for detection and/or  diagnosis of SARS-CoV-2 by FDA under an Emergency Use Authorization (EUA). This EUA will remain  in effect (meaning this test can be used) for the duration of the COVID-19 declaration under Section 564(b)(1) of the Act, 21 U.S.C.section 360bbb-3(b)(1), unless the authorization is terminated  or revoked sooner.       Influenza A by PCR NEGATIVE NEGATIVE   Influenza B by PCR NEGATIVE NEGATIVE    Comment: (NOTE) The Xpert Xpress SARS-CoV-2/FLU/RSV plus assay is intended as an aid in the diagnosis of influenza from Nasopharyngeal swab specimens and should not be used as a sole basis for treatment. Nasal washings and aspirates are unacceptable for Xpert Xpress SARS-CoV-2/FLU/RSV testing.  Fact Sheet for Patients: EntrepreneurPulse.com.au  Fact Sheet for Healthcare Providers: IncredibleEmployment.be  This test is not yet approved or cleared by the [place] FDA and has been authorized for detection and/or diagnosis of SARS-CoV-2 by FDA under an Emergency Use Authorization (EUA). This EUA will remain in effect (meaning this test can be used) for the duration of the COVID-19 declaration under Section 564(b)(1) of the Act, 21 U.S.C. section 360bbb-3(b)(1), unless the authorization is terminated or revoked.     Resp Syncytial Virus by PCR NEGATIVE NEGATIVE    Comment: (NOTE) Fact Sheet for Patients: EntrepreneurPulse.com.au  Fact Sheet for Healthcare Providers: IncredibleEmployment.be  This test is not yet approved or cleared by the [place] FDA and has been authorized for detection and/or diagnosis of SARS-CoV-2 by FDA under an Emergency Use Authorization (EUA). This EUA will remain in effect (meaning this test can be used) for the duration of the  COVID-19 declaration under Section 564(b)(1) of the Act, 21 U.S.C. section 360bbb-3(b)(1), unless the authorization is terminated or revoked.  Performed  at [facility], [address] [place], [place] [id number]   Basic metabolic panel     Status: Abnormal   Collection Time: 07/03/22  2:19 AM  Result Value Ref Range   Sodium 129 (L) 135 - 145 mmol/L   Potassium 3.4 (L) 3.5 - 5.1 mmol/L   Chloride 93 (L) 98 - 111 mmol/L   CO2 24 22 - 32 mmol/L   Glucose, Bld 155 (H) 70 - 99 mg/dL    Comment: Glucose reference range applies only to samples taken after fasting for at least 8 hours.   BUN 31 (H) 8 - 23 mg/dL   Creatinine, Ser 1.32 (H) 0.44 - 1.00 mg/dL   Calcium 8.7 (L) 8.9 - 10.3 mg/dL   GFR, Estimated 40 (L) >60 mL/min    Comment: (NOTE) Calculated using the CKD-EPI Creatinine Equation (2021)    Anion gap 12 5 - 15    Comment: Performed at [facility], [address] [place], [place] [address]  CBC     Status: Abnormal   Collection Time: 07/03/22  2:19 AM  Result Value Ref Range   WBC 9.5 4.0 - 10.5 K/uL   RBC 3.80 (L) 3.87 - 5.11 MIL/uL   Hemoglobin 11.0 (L) 12.0 - 15.0 g/dL   HCT 33.2 (L) 36.0 - 46.0 %   MCV 87.4 80.0 - 100.0 fL   MCH 28.9 26.0 - 34.0 pg   MCHC 33.1 30.0 - 36.0 g/dL   RDW 13.1 11.5 - 15.5 %   Platelets 336 150 - 400 K/uL   nRBC 0.0 0.0 - 0.2 %    Comment: Performed at [facility], [address] [place], [place] [address]  Troponin I (High Sensitivity)     Status: None   Collection Time: 07/03/22  2:19 AM  Result Value Ref Range   Troponin I (High Sensitivity) 10 <18 ng/L    Comment: (NOTE) Elevated high sensitivity troponin I (hsTnI) values and significant  changes across serial measurements may suggest ACS but many other  chronic and acute conditions are known to elevate hsTnI results.  Refer to the "Links" section for chest pain algorithms and additional  guidance. Performed at [facility], [place],  [address]   Troponin I (High Sensitivity)     Status: None   Collection Time: 07/03/22  5:02 AM  Result Value Ref Range   Troponin  I (High Sensitivity) 10 <18 ng/L    Comment: (NOTE) Elevated high sensitivity troponin I (hsTnI) values and significant  changes across serial measurements may suggest ACS but many other  chronic and acute conditions are known to elevate hsTnI results.  Refer to the "Links" section for chest pain algorithms and additional  guidance. Performed at [facility], [address] [place],  [address]   Hemoglobin A1c     Status: Abnormal   Collection Time: 07/03/22  5:02 AM  Result Value Ref Range   Hgb A1c MFr Bld 6.1 (H) 4.8 - 5.6 %    Comment: (NOTE)         Prediabetes: 5.7 - 6.4         Diabetes: >6.4         Glycemic control for adults with diabetes: <7.0    Mean Plasma Glucose 128 mg/dL    Comment: (NOTE) Performed At: [facility]  [place], [place] [id number] Rush Farmer MD [id number]   Glucose, capillary     Status: Abnormal   Collection Time: 07/03/22  8:21 AM  Result Value Ref Range   Glucose-Capillary 175 (H) 70 - 99 mg/dL    Comment: Glucose reference range applies only to samples taken after fasting for at least 8 hours.  Glucose, capillary     Status: Abnormal   Collection Time: 07/03/22 12:01 PM  Result Value Ref Range   Glucose-Capillary 149 (H) 70 - 99 mg/dL    Comment: Glucose reference range applies only to samples taken after fasting for at least 8 hours.  Glucose, capillary     Status: Abnormal   Collection Time: 07/03/22  3:41 PM  Result Value Ref Range   Glucose-Capillary 110 (H) 70 - 99 mg/dL    Comment: Glucose reference range applies only to samples taken after fasting for at least 8 hours.  Glucose, capillary     Status: Abnormal   Collection Time: 07/03/22  8:36 PM  Result Value Ref Range   Glucose-Capillary 111 (H) 70 - 99 mg/dL    Comment: Glucose reference range applies only to samples taken after fasting for at least 8 hours.  CBC     Status: Abnormal   Collection Time: 07/04/22  5:17 AM  Result  Value Ref Range   WBC 6.6 4.0 - 10.5 K/uL   RBC 3.48 (L) 3.87 - 5.11 MIL/uL   Hemoglobin 10.2 (L) 12.0 - 15.0 g/dL   HCT 30.8 (L) 36.0 - 46.0 %   MCV 88.5 80.0 - 100.0 fL   MCH 29.3 26.0 - 34.0 pg   MCHC 33.1 30.0 - 36.0 g/dL   RDW 13.2 11.5 - 15.5 %   Platelets 314 150 - 400 K/uL   nRBC 0.0 0.0 - 0.2 %    Comment: Performed at [facility], [address] [place], [place] [address]  Comprehensive metabolic panel     Status: Abnormal   Collection Time: 07/04/22  5:17 AM  Result Value Ref Range   Sodium 132 (L) 135 - 145 mmol/L   Potassium 4.1 3.5 - 5.1 mmol/L   Chloride 100 98 - 111 mmol/L   CO2 24 22 - 32 mmol/L   Glucose, Bld 99 70 - 99 mg/dL    Comment: Glucose reference range applies only to samples taken after fasting for at least 8 hours.   BUN 24 (H) 8 - 23 mg/dL   Creatinine, Ser 1.20 (H) 0.44 - 1.00 mg/dL   Calcium 8.8 (L) 8.9 - 10.3 mg/dL   Total Protein 6.1 (L) 6.5 - 8.1 g/dL   Albumin 3.2 (L) 3.5 - 5.0 g/dL   AST 21 15 - 41 U/L   ALT 12 0 - 44 U/L   Alkaline Phosphatase 33 (L) 38 - 126 U/L   Total Bilirubin 0.3 0.3 - 1.2 mg/dL   GFR, Estimated 44 (L) >60 mL/min    Comment: (NOTE) Calculated using the CKD-EPI Creatinine Equation (2021)    Anion gap 8 5 - 15    Comment: Performed at [facility], [address], [place], [place] [address]  Magnesium     Status: None   Collection Time: 07/04/22  5:17 AM  Result Value Ref Range   Magnesium 1.7 1.7 - 2.4 mg/dL    Comment: Performed at [facility], [address], [place], [place] [address]  Phosphorus     Status: None   Collection Time:  07/04/22  5:17 AM  Result Value Ref Range   Phosphorus 3.5 2.5 - 4.6 mg/dL    Comment: Performed at [facility], [address] [place], [place] [address]  C-reactive protein     Status: None   Collection Time: 07/04/22  5:17 AM  Result Value Ref Range   CRP <0.5 <1.0 mg/dL    Comment: Performed at [facility] [address] [place], [place] [address]  Glucose, capillary     Status: Abnormal   Collection Time: 07/04/22  8:47 AM  Result Value Ref Range   Glucose-Capillary 100 (H) 70 - 99 mg/dL    Comment: Glucose reference range applies only to samples taken after fasting for at least 8 hours.  Glucose, capillary     Status: Abnormal   Collection Time: 07/04/22 12:05 PM  Result Value Ref Range   Glucose-Capillary 126 (H) 70 - 99 mg/dL    Comment: Glucose reference range applies only to samples taken after fasting for at least 8 hours.  Glucose, capillary     Status: Abnormal   Collection Time: 07/04/22  5:53 PM  Result Value Ref Range   Glucose-Capillary 111 (H) 70 - 99 mg/dL    Comment: Glucose reference range applies only to samples taken after fasting for at least 8 hours.  Glucose, capillary     Status: Abnormal   Collection Time: 07/04/22  7:53 PM  Result Value Ref Range   Glucose-Capillary 104 (H) 70 - 99 mg/dL    Comment: Glucose reference range applies only to samples taken after fasting for at least 8 hours.  CBC     Status: Abnormal   Collection Time: 07/05/22  4:57 AM  Result Value Ref Range   WBC 6.0 4.0 - 10.5 K/uL   RBC 3.28 (L) 3.87 - 5.11 MIL/uL   Hemoglobin 9.6 (L) 12.0 - 15.0 g/dL   HCT 29.1 (L) 36.0 - 46.0 %   MCV 88.7 80.0 - 100.0 fL   MCH 29.3 26.0 - 34.0 pg   MCHC 33.0 30.0 - 36.0 g/dL   RDW 13.2 11.5 - 15.5 %   Platelets 321 150 - 400 K/uL   nRBC 0.0 0.0 - 0.2 %    Comment: Performed at [facility], [address] [place], [place] [address]  Comprehensive metabolic panel     Status: Abnormal   Collection Time: 07/05/22  4:57 AM  Result Value Ref Range   Sodium 138 135 - 145 mmol/L   Potassium 3.9 3.5 - 5.1 mmol/L   Chloride 107 98 - 111 mmol/L   CO2 25 22 - 32 mmol/L   Glucose, Bld 91 70 - 99 mg/dL    Comment: Glucose reference range applies only to samples taken after fasting for at least 8 hours.   BUN 20 8 - 23 mg/dL   Creatinine, Ser 0.98 0.44 - 1.00 mg/dL    Calcium 8.8 (L) 8.9 - 10.3 mg/dL   Total Protein 5.8 (L) 6.5 - 8.1 g/dL   Albumin 3.0 (L) 3.5 - 5.0 g/dL   AST 18 15 - 41 U/L   ALT 12 0 - 44 U/L   Alkaline Phosphatase 28 (L) 38 - 126 U/L   Total Bilirubin 0.3 0.3 - 1.2 mg/dL   GFR, Estimated 57 (L) >60 mL/min    Comment: (NOTE) Calculated using the CKD-EPI Creatinine Equation (2021)    Anion gap 6 5 - 15    Comment: Performed at [facility],  [place],  [place] [address]  Magnesium     Status: Abnormal   Collection Time: 07/05/22  4:57 AM  Result Value Ref Range   Magnesium 1.6 (L) 1.7 - 2.4 mg/dL    Comment: Performed at [facility], [address] [place], [place] [address]  Phosphorus     Status: None   Collection Time: 07/05/22  4:57 AM  Result Value Ref Range   Phosphorus 3.1 2.5 - 4.6 mg/dL    Comment: Performed at [facility], [address] [place], [place] [address]  C-reactive protein     Status: None   Collection Time: 07/05/22  4:57 AM  Result Value Ref Range   CRP <0.5 <1.0 mg/dL    Comment: Performed at [facility], [place] [address] [place], [place] [address]  Glucose, capillary     Status: None   Collection Time: 07/05/22  8:06 AM  Result Value Ref Range   Glucose-Capillary 92 70 - 99 mg/dL    Comment: Glucose reference range applies only to samples taken after fasting for at least 8 hours.  Glucose, capillary     Status: Abnormal   Collection Time: 07/05/22 11:57 AM  Result Value Ref Range   Glucose-Capillary 136 (H) 70 - 99 mg/dL    Comment: Glucose reference range applies only to samples taken after fasting for at least 8 hours.  Glucose, capillary     Status: Abnormal   Collection Time: 07/05/22  4:03 PM  Result Value Ref Range   Glucose-Capillary 113 (H) 70 - 99 mg/dL    Comment: Glucose reference range applies only to samples taken after fasting for at least 8 hours.  Glucose, capillary     Status: None   Collection Time: 07/05/22  8:21 PM   Result Value Ref Range   Glucose-Capillary 97 70 - 99 mg/dL    Comment: Glucose reference range applies only to samples taken after fasting for at least 8 hours.  CBC     Status: Abnormal   Collection Time: 07/06/22  5:40 AM  Result Value Ref Range   WBC 6.5 4.0 - 10.5 K/uL   RBC 3.41 (L) 3.87 - 5.11 MIL/uL   Hemoglobin 9.8 (L) 12.0 - 15.0 g/dL   HCT 30.5 (L) 36.0 - 46.0 %   MCV 89.4 80.0 - 100.0 fL   MCH 28.7 26.0 - 34.0 pg   MCHC 32.1 30.0 - 36.0 g/dL   RDW 13.3 11.5 - 15.5 %   Platelets 327 150 - 400 K/uL   nRBC 0.0 0.0 - 0.2 %    Comment: Performed at [facility], [address] [place], [place] [address]  Comprehensive metabolic panel     Status: Abnormal   Collection Time: 07/06/22  5:40 AM  Result Value Ref Range   Sodium 137 135 - 145 mmol/L   Potassium 4.1 3.5 - 5.1 mmol/L   Chloride 105 98 - 111 mmol/L   CO2 26 22 - 32 mmol/L   Glucose, Bld 111 (H) 70 - 99 mg/dL    Comment: Glucose reference range applies only to samples taken after fasting for at least 8 hours.   BUN 15 8 - 23 mg/dL   Creatinine, Ser 1.10 (H) 0.44 - 1.00 mg/dL   Calcium 8.7 (L) 8.9 - 10.3 mg/dL   Total Protein 6.0 (L) 6.5 - 8.1 g/dL   Albumin 3.1 (L) 3.5 - 5.0 g/dL   AST 20 15 - 41 U/L  ALT 11 0 - 44 U/L   Alkaline Phosphatase 31 (L) 38 - 126 U/L   Total Bilirubin 0.4 0.3 - 1.2 mg/dL   GFR, Estimated 49 (L) >60 mL/min    Comment: (NOTE) Calculated using the CKD-EPI Creatinine Equation (2021)    Anion gap 6 5 - 15    Comment: Performed at [facility], [address] [place], [place] [address]  Magnesium     Status: None   Collection Time: 07/06/22  5:40 AM  Result Value Ref Range   Magnesium 2.4 1.7 - 2.4 mg/dL    Comment: Performed at [facility], [address] [place], [place] [address]  Phosphorus     Status: None   Collection Time: 07/06/22  5:40 AM  Result Value Ref Range   Phosphorus 2.9 2.5 - 4.6 mg/dL    Comment: Performed at [facility], [address] [place], [place] [address]  C-reactive protein     Status: None   Collection Time: 07/06/22  5:40 AM  Result Value Ref Range   CRP 0.6 <1.0 mg/dL    Comment: Performed at [facility], [place] [address] [place], [place] [address]  Glucose, capillary     Status: Abnormal   Collection Time: 07/06/22  8:02 AM  Result Value Ref Range   Glucose-Capillary 101 (H) 70 - 99 mg/dL    Comment: Glucose reference range applies only to samples taken after fasting for at least 8 hours.  Glucose, capillary     Status: None   Collection Time: 07/06/22 11:39 AM  Result Value Ref Range   Glucose-Capillary 97 70 - 99 mg/dL    Comment: Glucose reference range applies only to samples taken after fasting for at least 8 hours.      Assessment & Plan:   Problem List Items Addressed This Visit       Cardiovascular and Mediastinum   Benign essential hypertension - Primary     Respiratory   CAP (community acquired pneumonia)     Digestive   GERD (gastroesophageal reflux disease)     Endocrine   Type 2 diabetes mellitus without complications ([place])     Other   Cough   Relevant Orders   CT CHEST LIMITED WO CONTRAST    Return in about 2 weeks (around 08/04/2022).   Total time spent: 35 minutes  Evern Bio, NP  07/21/2022

## 2022-07-22 LAB — CBC
Hematocrit: 38.1 % (ref 34.0–46.6)
Hemoglobin: 12 g/dL (ref 11.1–15.9)
MCH: 28.4 pg (ref 26.6–33.0)
MCHC: 31.5 g/dL (ref 31.5–35.7)
MCV: 90 fL (ref 79–97)
Platelets: 433 10*3/uL (ref 150–450)
RBC: 4.23 x10E6/uL (ref 3.77–5.28)
RDW: 12.4 % (ref 11.7–15.4)
WBC: 7.7 10*3/uL (ref 3.4–10.8)

## 2022-07-27 ENCOUNTER — Ambulatory Visit (INDEPENDENT_AMBULATORY_CARE_PROVIDER_SITE_OTHER): Payer: Medicare Other

## 2022-07-27 DIAGNOSIS — R051 Acute cough: Secondary | ICD-10-CM

## 2022-08-04 ENCOUNTER — Ambulatory Visit (INDEPENDENT_AMBULATORY_CARE_PROVIDER_SITE_OTHER): Payer: Medicare Other | Admitting: Nurse Practitioner

## 2022-08-04 ENCOUNTER — Encounter: Payer: Self-pay | Admitting: Nurse Practitioner

## 2022-08-04 VITALS — BP 140/82 | HR 88 | Ht 65.0 in | Wt 195.4 lb

## 2022-08-04 DIAGNOSIS — N179 Acute kidney failure, unspecified: Secondary | ICD-10-CM | POA: Diagnosis not present

## 2022-08-04 DIAGNOSIS — D199 Benign neoplasm of mesothelial tissue, unspecified: Secondary | ICD-10-CM

## 2022-08-04 DIAGNOSIS — R160 Hepatomegaly, not elsewhere classified: Secondary | ICD-10-CM

## 2022-08-04 DIAGNOSIS — N1832 Chronic kidney disease, stage 3b: Secondary | ICD-10-CM

## 2022-08-04 MED ORDER — TRIAMCINOLONE ACETONIDE 55 MCG/ACT NA AERO: 1.0000 | INHALATION_SPRAY | Freq: Two times a day (BID) | NASAL | 2 refills | Status: DC

## 2022-08-04 MED ORDER — LEVOCETIRIZINE DIHYDROCHLORIDE 5 MG PO TABS: 5.0000 mg | ORAL_TABLET | Freq: Every evening | ORAL | 5 refills | Status: DC

## 2022-08-04 NOTE — Progress Notes (Signed)
Established Patient Office Visit  Subjective:  Patient ID: Hayley Knight, female    DOB: 1936-04-26  Age: 87 y.o. MRN: [id number]  Chief Complaint  Patient presents with   Follow-up    2 week follow up    2 week follow up, reports allergy flare up.  Taking Mucinex but not effective.  Discussed seasonal allergies.  States eyes are also tearing up.      No other concerns at this time.   Past Medical History:  Diagnosis Date   Arthritis    Diabetes mellitus without complication    Type II   Hypertension    Sleep apnea    No Cpap    Past Surgical History:  Procedure Laterality Date   ABDOMINAL HYSTERECTOMY     APPENDECTOMY     CARDIAC CATHETERIZATION     CHOLECYSTECTOMY     DILATION AND CURETTAGE OF UTERUS     MASTECTOMY Right    REPLACEMENT TOTAL KNEE BILATERAL     RIGHT/LEFT HEART CATH AND CORONARY ANGIOGRAPHY Bilateral 07/09/2021   Procedure: RIGHT/LEFT HEART CATH AND CORONARY ANGIOGRAPHY;  Surgeon: Alwyn Pea, MD;  Location: [facility];  Service: Cardiovascular;  Laterality: Bilateral;   TOOTH EXTRACTION N/A 01/02/2022   Procedure: DENTAL RESTORATION/EXTRACTIONS;  Surgeon: Ocie Doyne, DMD;  Location: [facility];  Service: Oral Surgery;  Laterality: N/A;    Social History   Socioeconomic History   Marital status: Widowed    Spouse name: Not on file   Number of children: Not on file   Years of education: Not on file   Highest education level: Not on file  Occupational History   Not on file  Tobacco Use   Smoking status: Never    Passive exposure: Never   Smokeless tobacco: Never  Vaping Use   Vaping Use: Never used  Substance and Sexual Activity   Alcohol use: No   Drug use: No   Sexual activity: Not on file  Other Topics Concern   Not on file  Social History Narrative   Not on file   Social Determinants of Health   Financial Resource Strain: Not on file  Food Insecurity: No Food Insecurity (07/03/2022)   Hunger Vital Sign     Worried About Running Out of Food in the Last Year: Never true    Ran Out of Food in the Last Year: Never true  Transportation Needs: No Transportation Needs (07/03/2022)   PRAPARE - Administrator, Civil Service (Medical): No    Lack of Transportation (Non-Medical): No  Physical Activity: Not on file  Stress: Not on file  Social Connections: Not on file  Intimate Partner Violence: Not At Risk (07/03/2022)   Humiliation, Afraid, Rape, and Kick questionnaire    Fear of Current or Ex-Partner: No    Emotionally Abused: No    Physically Abused: No    Sexually Abused: No    No family history on file.  Allergies  Allergen Reactions   Iodine Swelling   Ivp Dye [Iodinated Contrast Media] Swelling   Sulfa Antibiotics Swelling    Review of Systems  Constitutional: Negative.   HENT:  Positive for congestion.   Eyes: Negative.   Respiratory: Negative.    Cardiovascular: Negative.   Gastrointestinal: Negative.   Genitourinary: Negative.   Musculoskeletal:  Positive for back pain and neck pain.  Skin: Negative.   Neurological: Negative.   Endo/Heme/Allergies: Negative.   Psychiatric/Behavioral:  Positive for depression.  Objective:   BP (!) 140/82   Pulse 88   Ht 5\' 5"  (1.651 m)   Wt 195 lb 6.4 oz (88.6 kg)   SpO2 93%   BMI 32.52 kg/m   Vitals:   08/04/22 1300  BP: (!) 140/82  Pulse: 88  Height: 5\' 5"  (1.651 m)  Weight: 195 lb 6.4 oz (88.6 kg)  SpO2: 93%  BMI (Calculated): 32.52    Physical Exam Vitals reviewed.  Constitutional:      Appearance: Normal appearance.  HENT:     Head: Normocephalic.     Nose: Nose normal.     Mouth/Throat:     Mouth: Mucous membranes are moist.  Eyes:     Pupils: Pupils are equal, round, and reactive to light.  Cardiovascular:     Rate and Rhythm: Normal rate and regular rhythm.  Pulmonary:     Effort: Pulmonary effort is normal.     Breath sounds: Normal breath sounds.  Abdominal:     General: Bowel sounds  are normal.     Palpations: Abdomen is soft.  Musculoskeletal:        General: Tenderness present. Normal range of motion.     Cervical back: Normal range of motion and neck supple.  Skin:    General: Skin is warm and dry.  Neurological:     Mental Status: She is alert and oriented to person, place, and time.  Psychiatric:        Mood and Affect: Mood normal.        Behavior: Behavior normal.      No results found for any visits on 08/04/22.      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   Total time spent: 35 minutes  Orson Eva, NP  08/04/2022

## 2022-08-04 NOTE — Patient Instructions (Signed)
1) Adding on allergy meds, nasal spray, zaditor 2) Liver CT 3) Follow up appt in 3 months, fasting labs prior

## 2022-08-07 ENCOUNTER — Other Ambulatory Visit: Payer: Self-pay | Admitting: Nurse Practitioner

## 2022-08-07 DIAGNOSIS — I10 Essential (primary) hypertension: Secondary | ICD-10-CM

## 2022-08-12 DIAGNOSIS — G890 Central pain syndrome: Secondary | ICD-10-CM | POA: Diagnosis not present

## 2022-08-12 DIAGNOSIS — M545 Low back pain, unspecified: Secondary | ICD-10-CM | POA: Diagnosis not present

## 2022-08-12 DIAGNOSIS — M542 Cervicalgia: Secondary | ICD-10-CM | POA: Diagnosis not present

## 2022-08-12 DIAGNOSIS — Z79891 Long term (current) use of opiate analgesic: Secondary | ICD-10-CM | POA: Diagnosis not present

## 2022-08-17 ENCOUNTER — Telehealth: Payer: Self-pay

## 2022-08-17 ENCOUNTER — Other Ambulatory Visit: Payer: Self-pay | Admitting: Nurse Practitioner

## 2022-08-17 MED ORDER — CLOPIDOGREL BISULFATE 75 MG PO TABS: 75.0000 mg | ORAL_TABLET | Freq: Every day | ORAL | 0 refills | Status: DC

## 2022-08-17 NOTE — Telephone Encounter (Signed)
sent 

## 2022-08-17 NOTE — Telephone Encounter (Signed)
Patient called asking if you could send in 30 days worth of rx to  the pharamcy she said she will get there  cardiologist to send It In

## 2022-08-27 ENCOUNTER — Other Ambulatory Visit: Payer: Self-pay | Admitting: Nurse Practitioner

## 2022-08-27 DIAGNOSIS — D199 Benign neoplasm of mesothelial tissue, unspecified: Secondary | ICD-10-CM

## 2022-09-04 ENCOUNTER — Telehealth: Payer: Self-pay | Admitting: Nurse Practitioner

## 2022-09-04 DIAGNOSIS — H903 Sensorineural hearing loss, bilateral: Secondary | ICD-10-CM | POA: Diagnosis not present

## 2022-09-04 DIAGNOSIS — H6063 Unspecified chronic otitis externa, bilateral: Secondary | ICD-10-CM | POA: Diagnosis not present

## 2022-09-04 DIAGNOSIS — H6123 Impacted cerumen, bilateral: Secondary | ICD-10-CM | POA: Diagnosis not present

## 2022-09-04 NOTE — Telephone Encounter (Signed)
Patient left VM requesting that we tell Chelsa that she is having her CT scan of her liver on 5/16 at 3:30. FYI

## 2022-09-09 ENCOUNTER — Telehealth: Payer: Self-pay | Admitting: Nurse Practitioner

## 2022-09-09 ENCOUNTER — Telehealth: Payer: Self-pay

## 2022-09-09 NOTE — Telephone Encounter (Signed)
Patient called again about needing her pre-meds for her CT scan. Refer to previous telephone encounter please. Can you please do this for her since Chelsa isn't back until tomorrow and the scan is tomorrow??

## 2022-09-09 NOTE — Telephone Encounter (Signed)
Pt called and left vm regarding having a scan on liver tomorrow, said she needs to be prepped because she is allergic to the dye? Said they called her & she needs rx prednisone 50mg  (3 doses) & 50mg  benadryl (1 dose) Requesting a call back, said this is needs to be taken care of because her scan is tomorrow

## 2022-09-10 ENCOUNTER — Other Ambulatory Visit: Payer: Self-pay | Admitting: Nurse Practitioner

## 2022-09-10 ENCOUNTER — Ambulatory Visit: Admission: RE | Admit: 2022-09-10 | Payer: Medicare Other | Source: Ambulatory Visit

## 2022-09-10 MED ORDER — DIPHENHYDRAMINE HCL 50 MG PO CAPS: ORAL_CAPSULE | ORAL | 0 refills | Status: DC

## 2022-09-10 MED ORDER — PREDNISONE 50 MG PO TABS: ORAL_TABLET | ORAL | 0 refills | Status: DC

## 2022-09-10 NOTE — Telephone Encounter (Signed)
Yes she called yesterday and it was verified her MRI is on the 21st, sending it to her pharm now

## 2022-09-10 NOTE — Telephone Encounter (Signed)
Patient called again asking about gettng the 50mg  prednisone and 50mg  benadryl called for her please advise

## 2022-09-12 ENCOUNTER — Other Ambulatory Visit: Payer: Self-pay | Admitting: Internal Medicine

## 2022-09-12 MED ORDER — PREDNISONE 50 MG PO TABS: ORAL_TABLET | ORAL | 0 refills | Status: DC

## 2022-09-12 MED ORDER — DIPHENHYDRAMINE HCL 50 MG PO CAPS: ORAL_CAPSULE | ORAL | 0 refills | Status: DC

## 2022-09-15 ENCOUNTER — Ambulatory Visit
Admission: RE | Admit: 2022-09-15 | Discharge: 2022-09-15 | Disposition: A | Discharge status: Home / Self Care | Payer: Medicare Other | Source: Ambulatory Visit | Attending: Nurse Practitioner | Admitting: Nurse Practitioner

## 2022-09-15 DIAGNOSIS — I700 Atherosclerosis of aorta: Secondary | ICD-10-CM | POA: Diagnosis not present

## 2022-09-15 DIAGNOSIS — Z853 Personal history of malignant neoplasm of breast: Secondary | ICD-10-CM | POA: Insufficient documentation

## 2022-09-15 DIAGNOSIS — D199 Benign neoplasm of mesothelial tissue, unspecified: Secondary | ICD-10-CM | POA: Insufficient documentation

## 2022-09-15 DIAGNOSIS — Q6102 Congenital multiple renal cysts: Secondary | ICD-10-CM | POA: Insufficient documentation

## 2022-09-15 DIAGNOSIS — N281 Cyst of kidney, acquired: Secondary | ICD-10-CM | POA: Diagnosis not present

## 2022-09-15 LAB — POCT I-STAT CREATININE: Creatinine, Ser: 1 mg/dL (ref 0.44–1.00)

## 2022-09-15 MED ORDER — IOHEXOL 300 MG/ML  SOLN
100.0000 mL | Freq: Once | INTRAMUSCULAR | Status: AC | PRN
  Administered 2022-09-15: 100 mL via INTRAVENOUS

## 2022-09-21 ENCOUNTER — Ambulatory Visit (INDEPENDENT_AMBULATORY_CARE_PROVIDER_SITE_OTHER): Payer: Medicare Other | Admitting: Nurse Practitioner

## 2022-09-21 ENCOUNTER — Encounter: Payer: Self-pay | Admitting: Nurse Practitioner

## 2022-09-21 VITALS — BP 118/60 | HR 79 | Ht 59.5 in | Wt 189.0 lb

## 2022-09-21 DIAGNOSIS — K7689 Other specified diseases of liver: Secondary | ICD-10-CM

## 2022-09-21 NOTE — Progress Notes (Signed)
Established Patient Office Visit  Subjective:  Patient ID: Hayley Knight, female    DOB: 1935-10-28  Age: 87 y.o. MRN: [id number]  Chief Complaint  Patient presents with   Follow-up    CT Results    Patient here today for a follow up.  Reviewed CT of abdomen with patient.  No change in liver benign calcified cyst.  Patient wants referral to Dr. Trisha Mangle to discuss removal.      No other concerns at this time.   Past Medical History:  Diagnosis Date   Arthritis    Diabetes mellitus without complication (HCC)    Type II   Hypertension    Sleep apnea    No Cpap    Past Surgical History:  Procedure Laterality Date   ABDOMINAL HYSTERECTOMY     APPENDECTOMY     CARDIAC CATHETERIZATION     CHOLECYSTECTOMY     DILATION AND CURETTAGE OF UTERUS     MASTECTOMY Right    REPLACEMENT TOTAL KNEE BILATERAL     RIGHT/LEFT HEART CATH AND CORONARY ANGIOGRAPHY Bilateral 07/09/2021   Procedure: RIGHT/LEFT HEART CATH AND CORONARY ANGIOGRAPHY;  Surgeon: Alwyn Pea, MD;  Location: [facility];  Service: Cardiovascular;  Laterality: Bilateral;   TOOTH EXTRACTION N/A 01/02/2022   Procedure: DENTAL RESTORATION/EXTRACTIONS;  Surgeon: Ocie Doyne, DMD;  Location: [facility];  Service: Oral Surgery;  Laterality: N/A;    Social History   Socioeconomic History   Marital status: Widowed    Spouse name: Not on file   Number of children: Not on file   Years of education: Not on file   Highest education level: Not on file  Occupational History   Not on file  Tobacco Use   Smoking status: Never    Passive exposure: Never   Smokeless tobacco: Never  Vaping Use   Vaping Use: Never used  Substance and Sexual Activity   Alcohol use: No   Drug use: No   Sexual activity: Not on file  Other Topics Concern   Not on file  Social History Narrative   Not on file   Social Determinants of Health   Financial Resource Strain: Not on file  Food Insecurity: No Food Insecurity (07/03/2022)    Hunger Vital Sign    Worried About Running Out of Food in the Last Year: Never true    Ran Out of Food in the Last Year: Never true  Transportation Needs: No Transportation Needs (07/03/2022)   PRAPARE - Administrator, Civil Service (Medical): No    Lack of Transportation (Non-Medical): No  Physical Activity: Not on file  Stress: Not on file  Social Connections: Not on file  Intimate Partner Violence: Not At Risk (07/03/2022)   Humiliation, Afraid, Rape, and Kick questionnaire    Fear of Current or Ex-Partner: No    Emotionally Abused: No    Physically Abused: No    Sexually Abused: No    No family history on file.  Allergies  Allergen Reactions   Iodine Swelling   Ivp Dye [Iodinated Contrast Media] Swelling   Sulfa Antibiotics Swelling    Review of Systems  Constitutional: Negative.   HENT: Negative.    Eyes: Negative.   Respiratory: Negative.    Cardiovascular: Negative.   Gastrointestinal: Negative.   Genitourinary: Negative.   Musculoskeletal:  Positive for back pain, joint pain and myalgias.  Skin: Negative.   Neurological: Negative.   Endo/Heme/Allergies: Negative.   Psychiatric/Behavioral:  The patient  is nervous/anxious.        Objective:   BP 118/60   Pulse 79   Ht 4' 11.5" (1.511 m)   Wt 189 lb (85.7 kg)   SpO2 96%   BMI 37.53 kg/m   Vitals:   09/21/22 1354  BP: 118/60  Pulse: 79  Height: 4' 11.5" (1.511 m)  Weight: 189 lb (85.7 kg)  SpO2: 96%  BMI (Calculated): 37.55    Physical Exam Vitals and nursing note reviewed.  Constitutional:      Appearance: Normal appearance.  HENT:     Head: Normocephalic.     Nose: Nose normal.     Mouth/Throat:     Mouth: Mucous membranes are moist.  Eyes:     Pupils: Pupils are equal, round, and reactive to light.  Cardiovascular:     Rate and Rhythm: Normal rate and regular rhythm.  Pulmonary:     Effort: Pulmonary effort is normal.     Breath sounds: Normal breath sounds.  Abdominal:      General: Bowel sounds are normal.     Palpations: Abdomen is soft.  Musculoskeletal:        General: Swelling and tenderness present. Normal range of motion.     Cervical back: Normal range of motion and neck supple.  Skin:    General: Skin is warm and dry.  Neurological:     Mental Status: She is alert and oriented to person, place, and time.  Psychiatric:        Mood and Affect: Mood normal.        Behavior: Behavior normal.      No results found for any visits on 09/21/22.      Assessment & Plan:  1) Gen Surg referral - pt insisted on Gen Surg referral for benign liver cyst 2) Keep already sched appt in future Problem List Items Addressed This Visit   None   No follow-ups on file.   Total time spent: 35 minutes  Orson Eva, NP  09/21/2022   This document may have been prepared by [facility] Voice Recognition software and as such may include unintentional dictation errors.

## 2022-09-23 ENCOUNTER — Other Ambulatory Visit: Payer: Self-pay | Admitting: Nurse Practitioner

## 2022-10-01 ENCOUNTER — Other Ambulatory Visit: Payer: Self-pay | Admitting: Nurse Practitioner

## 2022-10-05 ENCOUNTER — Ambulatory Visit: Payer: Medicare Other | Admitting: Urology

## 2022-10-07 DIAGNOSIS — G890 Central pain syndrome: Secondary | ICD-10-CM | POA: Diagnosis not present

## 2022-10-07 DIAGNOSIS — M542 Cervicalgia: Secondary | ICD-10-CM | POA: Diagnosis not present

## 2022-10-07 DIAGNOSIS — M545 Low back pain, unspecified: Secondary | ICD-10-CM | POA: Diagnosis not present

## 2022-10-07 DIAGNOSIS — Z79891 Long term (current) use of opiate analgesic: Secondary | ICD-10-CM | POA: Diagnosis not present

## 2022-10-16 ENCOUNTER — Other Ambulatory Visit: Payer: Self-pay | Admitting: Nurse Practitioner

## 2022-10-26 DIAGNOSIS — Z955 Presence of coronary angioplasty implant and graft: Secondary | ICD-10-CM | POA: Diagnosis not present

## 2022-10-26 DIAGNOSIS — R0602 Shortness of breath: Secondary | ICD-10-CM | POA: Diagnosis not present

## 2022-10-26 DIAGNOSIS — I2089 Other forms of angina pectoris: Secondary | ICD-10-CM | POA: Diagnosis not present

## 2022-10-26 DIAGNOSIS — K219 Gastro-esophageal reflux disease without esophagitis: Secondary | ICD-10-CM | POA: Diagnosis not present

## 2022-10-26 DIAGNOSIS — I214 Non-ST elevation (NSTEMI) myocardial infarction: Secondary | ICD-10-CM | POA: Diagnosis not present

## 2022-10-26 DIAGNOSIS — N183 Chronic kidney disease, stage 3 unspecified: Secondary | ICD-10-CM | POA: Diagnosis not present

## 2022-10-26 DIAGNOSIS — I10 Essential (primary) hypertension: Secondary | ICD-10-CM | POA: Diagnosis not present

## 2022-10-26 DIAGNOSIS — E119 Type 2 diabetes mellitus without complications: Secondary | ICD-10-CM | POA: Diagnosis not present

## 2022-10-26 DIAGNOSIS — I25110 Atherosclerotic heart disease of native coronary artery with unstable angina pectoris: Secondary | ICD-10-CM | POA: Diagnosis not present

## 2022-10-26 DIAGNOSIS — N1831 Chronic kidney disease, stage 3a: Secondary | ICD-10-CM | POA: Diagnosis not present

## 2022-10-28 ENCOUNTER — Telehealth: Payer: Self-pay | Admitting: Nurse Practitioner

## 2022-10-28 NOTE — Telephone Encounter (Signed)
Patient called to see if Chelsa would be here on 7/12 for her appointment.

## 2022-11-06 ENCOUNTER — Ambulatory Visit: Payer: Medicare Other | Admitting: Nurse Practitioner

## 2022-11-09 ENCOUNTER — Other Ambulatory Visit: Payer: Medicare Other

## 2022-11-09 DIAGNOSIS — I10 Essential (primary) hypertension: Secondary | ICD-10-CM

## 2022-11-09 DIAGNOSIS — E119 Type 2 diabetes mellitus without complications: Secondary | ICD-10-CM | POA: Diagnosis not present

## 2022-11-09 DIAGNOSIS — E785 Hyperlipidemia, unspecified: Secondary | ICD-10-CM | POA: Diagnosis not present

## 2022-11-10 LAB — LIPID PANEL
Chol/HDL Ratio: 2.9 ratio (ref 0.0–4.4)
Cholesterol, Total: 101 mg/dL (ref 100–199)
HDL: 35 mg/dL — ABNORMAL LOW (ref >39)
LDL Chol Calc (NIH): 42 mg/dL (ref 0–99)
Triglycerides: 137 mg/dL (ref 0–149)
VLDL Cholesterol Cal: 24 mg/dL (ref 5–40)

## 2022-11-10 LAB — CBC WITH DIFFERENTIAL
Basophils Absolute: 0.1 10*3/uL (ref 0.0–0.2)
Basos: 1 %
EOS (ABSOLUTE): 0.4 10*3/uL (ref 0.0–0.4)
Eos: 5 %
Hematocrit: 38.2 % (ref 34.0–46.6)
Hemoglobin: 12.3 g/dL (ref 11.1–15.9)
Immature Grans (Abs): 0 10*3/uL (ref 0.0–0.1)
Immature Granulocytes: 1 %
Lymphocytes Absolute: 2.5 10*3/uL (ref 0.7–3.1)
Lymphs: 31 %
MCH: 30.1 pg (ref 26.6–33.0)
MCHC: 32.2 g/dL (ref 31.5–35.7)
MCV: 94 fL (ref 79–97)
Monocytes Absolute: 0.6 10*3/uL (ref 0.1–0.9)
Monocytes: 7 %
Neutrophils Absolute: 4.6 10*3/uL (ref 1.4–7.0)
Neutrophils: 55 %
RBC: 4.08 x10E6/uL (ref 3.77–5.28)
RDW: 13.1 % (ref 11.7–15.4)
WBC: 8.2 10*3/uL (ref 3.4–10.8)

## 2022-11-10 LAB — CMP14+EGFR
ALT: 13 IU/L (ref 0–32)
AST: 22 IU/L (ref 0–40)
Albumin: 4.7 g/dL (ref 3.7–4.7)
Alkaline Phosphatase: 54 IU/L (ref 44–121)
BUN/Creatinine Ratio: 22 (ref 12–28)
BUN: 29 mg/dL — ABNORMAL HIGH (ref 8–27)
Bilirubin Total: 0.3 mg/dL (ref 0.0–1.2)
CO2: 27 mmol/L (ref 20–29)
Calcium: 10.2 mg/dL (ref 8.7–10.3)
Chloride: 87 mmol/L — ABNORMAL LOW (ref 96–106)
Creatinine, Ser: 1.31 mg/dL — ABNORMAL HIGH (ref 0.57–1.00)
Globulin, Total: 2.4 g/dL (ref 1.5–4.5)
Glucose: 98 mg/dL (ref 70–99)
Potassium: 4.7 mmol/L (ref 3.5–5.2)
Sodium: 134 mmol/L (ref 134–144)
Total Protein: 7.1 g/dL (ref 6.0–8.5)
eGFR: 40 mL/min/{1.73_m2} — ABNORMAL LOW (ref >59)

## 2022-11-10 LAB — TSH: TSH: 3.67 u[IU]/mL (ref 0.450–4.500)

## 2022-11-10 LAB — HEMOGLOBIN A1C
Est. average glucose Bld gHb Est-mCnc: 123 mg/dL
Hgb A1c MFr Bld: 5.9 % — ABNORMAL HIGH (ref 4.8–5.6)

## 2022-11-13 ENCOUNTER — Encounter: Payer: Self-pay | Admitting: Cardiology

## 2022-11-13 ENCOUNTER — Ambulatory Visit (INDEPENDENT_AMBULATORY_CARE_PROVIDER_SITE_OTHER): Payer: Medicare Other | Admitting: Cardiology

## 2022-11-13 VITALS — BP 120/72 | HR 79 | Ht 59.5 in | Wt 201.0 lb

## 2022-11-13 DIAGNOSIS — K219 Gastro-esophageal reflux disease without esophagitis: Secondary | ICD-10-CM | POA: Diagnosis not present

## 2022-11-13 DIAGNOSIS — E785 Hyperlipidemia, unspecified: Secondary | ICD-10-CM

## 2022-11-13 DIAGNOSIS — E119 Type 2 diabetes mellitus without complications: Secondary | ICD-10-CM

## 2022-11-13 DIAGNOSIS — I10 Essential (primary) hypertension: Secondary | ICD-10-CM | POA: Diagnosis not present

## 2022-11-13 NOTE — Progress Notes (Signed)
Established Patient Office Visit  Subjective:  Patient ID: Hayley Knight, female    DOB: 16-Apr-1936  Age: 87 y.o. MRN: [id number]  Chief Complaint  Patient presents with   Follow-up    3 month lab results    Patient in office for 3 month follow up. Patient doing well. No complaints today. Recent fasting labs, kidney function decreased. Patient states she has not been drinking an adequate amount of water. Hgb A1c improved. LDL well controlled. Continue all medications.     No other concerns at this time.   Past Medical History:  Diagnosis Date   Arthritis    Diabetes mellitus without complication (HCC)    Type II   Hypertension    Sleep apnea    No Cpap    Past Surgical History:  Procedure Laterality Date   ABDOMINAL HYSTERECTOMY     APPENDECTOMY     CARDIAC CATHETERIZATION     CHOLECYSTECTOMY     DILATION AND CURETTAGE OF UTERUS     MASTECTOMY Right    REPLACEMENT TOTAL KNEE BILATERAL     RIGHT/LEFT HEART CATH AND CORONARY ANGIOGRAPHY Bilateral 07/09/2021   Procedure: RIGHT/LEFT HEART CATH AND CORONARY ANGIOGRAPHY;  Surgeon: Alwyn Pea, MD;  Location: [facility];  Service: Cardiovascular;  Laterality: Bilateral;   TOOTH EXTRACTION N/A 01/02/2022   Procedure: DENTAL RESTORATION/EXTRACTIONS;  Surgeon: Ocie Doyne, DMD;  Location: [facility] OR;  Service: Oral Surgery;  Laterality: N/A;    Social History   Socioeconomic History   Marital status: Widowed    Spouse name: Not on file   Number of children: Not on file   Years of education: Not on file   Highest education level: Not on file  Occupational History   Not on file  Tobacco Use   Smoking status: Never    Passive exposure: Never   Smokeless tobacco: Never  Vaping Use   Vaping status: Never Used  Substance and Sexual Activity   Alcohol use: No   Drug use: No   Sexual activity: Not on file  Other Topics Concern   Not on file  Social History Narrative   Not on file   Social  Determinants of Health   Financial Resource Strain: Not on file  Food Insecurity: No Food Insecurity (07/03/2022)   Hunger Vital Sign    Worried About Running Out of Food in the Last Year: Never true    Ran Out of Food in the Last Year: Never true  Transportation Needs: No Transportation Needs (07/03/2022)   PRAPARE - Administrator, Civil Service (Medical): No    Lack of Transportation (Non-Medical): No  Physical Activity: Not on file  Stress: Not on file  Social Connections: Not on file  Intimate Partner Violence: Not At Risk (07/03/2022)   Humiliation, Afraid, Rape, and Kick questionnaire    Fear of Current or Ex-Partner: No    Emotionally Abused: No    Physically Abused: No    Sexually Abused: No    History reviewed. No pertinent family history.  Allergies  Allergen Reactions   Iodine Swelling   Ivp Dye [Iodinated Contrast Media] Swelling   Sulfa Antibiotics Swelling    Review of Systems  Constitutional: Negative.   HENT: Negative.    Eyes: Negative.   Respiratory: Negative.  Negative for shortness of breath.   Cardiovascular: Negative.  Negative for chest pain.  Gastrointestinal: Negative.  Negative for abdominal pain, constipation and diarrhea.  Genitourinary: Negative.  Musculoskeletal:  Negative for joint pain and myalgias.  Skin: Negative.   Neurological: Negative.  Negative for dizziness and headaches.  Endo/Heme/Allergies: Negative.   All other systems reviewed and are negative.      Objective:   BP 120/72   Pulse 79   Ht 4' 11.5" (1.511 m)   Wt 201 lb (91.2 kg)   SpO2 93%   BMI 39.92 kg/m   Vitals:   11/13/22 1418  BP: 120/72  Pulse: 79  Height: 4' 11.5" (1.511 m)  Weight: 201 lb (91.2 kg)  SpO2: 93%  BMI (Calculated): 39.93    Physical Exam Vitals and nursing note reviewed.  Constitutional:      Appearance: Normal appearance. She is normal weight.  HENT:     Head: Normocephalic and atraumatic.     Nose: Nose normal.      Mouth/Throat:     Mouth: Mucous membranes are moist.  Eyes:     Extraocular Movements: Extraocular movements intact.     Conjunctiva/sclera: Conjunctivae normal.     Pupils: Pupils are equal, round, and reactive to light.  Cardiovascular:     Rate and Rhythm: Normal rate and regular rhythm.     Pulses: Normal pulses.     Heart sounds: Normal heart sounds.  Pulmonary:     Effort: Pulmonary effort is normal.     Breath sounds: Normal breath sounds.  Abdominal:     General: Abdomen is flat. Bowel sounds are normal.     Palpations: Abdomen is soft.  Musculoskeletal:        General: Normal range of motion.     Cervical back: Normal range of motion.  Skin:    General: Skin is warm and dry.  Neurological:     General: No focal deficit present.     Mental Status: She is alert and oriented to person, place, and time.  Psychiatric:        Mood and Affect: Mood normal.        Behavior: Behavior normal.        Thought Content: Thought content normal.        Judgment: Judgment normal.      No results found for any visits on 11/13/22.  Recent Results (from the past 2160 hour(s))  I-STAT creatinine     Status: None   Collection Time: 09/15/22  3:31 PM  Result Value Ref Range   Creatinine, Ser 1.00 0.44 - 1.00 mg/dL  Hemoglobin B1Y     Status: Abnormal   Collection Time: 11/09/22  3:55 PM  Result Value Ref Range   Hgb A1c MFr Bld 5.9 (H) 4.8 - 5.6 %    Comment:          Prediabetes: 5.7 - 6.4          Diabetes: >6.4          Glycemic control for adults with diabetes: <7.0    Est. average glucose Bld gHb Est-mCnc 123 mg/dL  TSH     Status: None   Collection Time: 11/09/22  3:55 PM  Result Value Ref Range   TSH 3.670 0.450 - 4.500 uIU/mL  CMP14+EGFR     Status: Abnormal   Collection Time: 11/09/22  3:55 PM  Result Value Ref Range   Glucose 98 70 - 99 mg/dL   BUN 29 (H) 8 - 27 mg/dL   Creatinine, Ser 7.82 (H) 0.57 - 1.00 mg/dL   eGFR 40 (L) >95 AO/ZHY/8.65   BUN/Creatinine  Ratio 22  12 - 28   Sodium 134 134 - 144 mmol/L   Potassium 4.7 3.5 - 5.2 mmol/L   Chloride 87 (L) 96 - 106 mmol/L   CO2 27 20 - 29 mmol/L   Calcium 10.2 8.7 - 10.3 mg/dL   Total Protein 7.1 6.0 - 8.5 g/dL   Albumin 4.7 3.7 - 4.7 g/dL   Globulin, Total 2.4 1.5 - 4.5 g/dL   Bilirubin Total 0.3 0.0 - 1.2 mg/dL   Alkaline Phosphatase 54 44 - 121 IU/L   AST 22 0 - 40 IU/L   ALT 13 0 - 32 IU/L  Lipid panel     Status: Abnormal   Collection Time: 11/09/22  3:55 PM  Result Value Ref Range   Cholesterol, Total 101 100 - 199 mg/dL   Triglycerides 161 0 - 149 mg/dL   HDL 35 (L) >09 mg/dL   VLDL Cholesterol Cal 24 5 - 40 mg/dL   LDL Chol Calc (NIH) 42 0 - 99 mg/dL   Chol/HDL Ratio 2.9 0.0 - 4.4 ratio    Comment:                                   T. Chol/HDL Ratio                                             Men  Women                               1/2 Avg.Risk  3.4    3.3                                   Avg.Risk  5.0    4.4                                2X Avg.Risk  9.6    7.1                                3X Avg.Risk 23.4   11.0   CBC With Differential     Status: None   Collection Time: 11/09/22  3:55 PM  Result Value Ref Range   WBC 8.2 3.4 - 10.8 x10E3/uL   RBC 4.08 3.77 - 5.28 x10E6/uL   Hemoglobin 12.3 11.1 - 15.9 g/dL   Hematocrit 60.4 54.0 - 46.6 %   MCV 94 79 - 97 fL   MCH 30.1 26.6 - 33.0 pg   MCHC 32.2 31.5 - 35.7 g/dL   RDW 98.1 19.1 - 47.8 %   Neutrophils 55 Not Estab. %   Lymphs 31 Not Estab. %   Monocytes 7 Not Estab. %   Eos 5 Not Estab. %   Basos 1 Not Estab. %   Neutrophils Absolute 4.6 1.4 - 7.0 x10E3/uL   Lymphocytes Absolute 2.5 0.7 - 3.1 x10E3/uL   Monocytes Absolute 0.6 0.1 - 0.9 x10E3/uL   EOS (ABSOLUTE) 0.4 0.0 - 0.4 x10E3/uL   Basophils Absolute 0.1 0.0 - 0.2 x10E3/uL   Immature Granulocytes 1 Not Estab. %  Immature Grans (Abs) 0.0 0.0 - 0.1 x10E3/uL    Comment: **Effective November 23, 2022, profile [id number] CBC/Differential**   (No Platelet) will be  made non-orderable. [facility] Offers:   [id number] CBC With Differential/Platelet       Assessment & Plan:  Continue all medications. Increase water intake.  Fasting labs prior to next visit.   Problem List Items Addressed This Visit       Cardiovascular and Mediastinum   Benign essential hypertension - Primary   Relevant Orders   TSH   CMP14+EGFR   CBC With Diff/Platelet     Digestive   GERD without esophagitis     Endocrine   Diabetes mellitus type 2, uncomplicated (HCC)   Relevant Orders   Hemoglobin A1c   TSH   CMP14+EGFR   CBC With Diff/Platelet     Other   Dyslipidemia   Relevant Orders   TSH   CMP14+EGFR   Lipid Profile    Return in about 3 months (around 02/13/2023) for with fasting labs prior.   Total time spent: 25 minutes  [employer], NP  11/13/2022   This document may have been prepared by Dragon Voice Recognition software and as such may include unintentional dictation errors.

## 2022-11-15 ENCOUNTER — Other Ambulatory Visit: Payer: Self-pay | Admitting: Nurse Practitioner

## 2022-11-25 ENCOUNTER — Other Ambulatory Visit: Payer: Self-pay | Admitting: Nurse Practitioner

## 2022-11-25 DIAGNOSIS — K219 Gastro-esophageal reflux disease without esophagitis: Secondary | ICD-10-CM

## 2022-11-30 DIAGNOSIS — M545 Low back pain, unspecified: Secondary | ICD-10-CM | POA: Diagnosis not present

## 2022-11-30 DIAGNOSIS — G890 Central pain syndrome: Secondary | ICD-10-CM | POA: Diagnosis not present

## 2022-11-30 DIAGNOSIS — M542 Cervicalgia: Secondary | ICD-10-CM | POA: Diagnosis not present

## 2022-11-30 DIAGNOSIS — Z79891 Long term (current) use of opiate analgesic: Secondary | ICD-10-CM | POA: Diagnosis not present

## 2022-12-02 ENCOUNTER — Other Ambulatory Visit: Payer: Self-pay

## 2022-12-02 MED ORDER — PREDNISONE 5 MG PO TABS: 5.0000 mg | ORAL_TABLET | Freq: Two times a day (BID) | ORAL | 1 refills | Status: DC

## 2022-12-11 ENCOUNTER — Ambulatory Visit: Payer: Medicare Other | Admitting: Cardiology

## 2022-12-15 ENCOUNTER — Other Ambulatory Visit: Payer: Self-pay

## 2022-12-15 ENCOUNTER — Telehealth: Payer: Self-pay | Admitting: Nurse Practitioner

## 2022-12-15 NOTE — Telephone Encounter (Signed)
Patient left VM stating she would like a call back.

## 2022-12-25 ENCOUNTER — Ambulatory Visit: Payer: Medicare Other | Admitting: Family

## 2022-12-30 ENCOUNTER — Ambulatory Visit (INDEPENDENT_AMBULATORY_CARE_PROVIDER_SITE_OTHER): Payer: Medicare Other | Admitting: Family

## 2022-12-30 ENCOUNTER — Encounter: Payer: Self-pay | Admitting: Family

## 2022-12-30 DIAGNOSIS — E1165 Type 2 diabetes mellitus with hyperglycemia: Secondary | ICD-10-CM | POA: Diagnosis not present

## 2022-12-30 DIAGNOSIS — N1832 Chronic kidney disease, stage 3b: Secondary | ICD-10-CM

## 2022-12-30 DIAGNOSIS — R21 Rash and other nonspecific skin eruption: Secondary | ICD-10-CM | POA: Diagnosis not present

## 2022-12-30 DIAGNOSIS — K7689 Other specified diseases of liver: Secondary | ICD-10-CM | POA: Diagnosis not present

## 2022-12-30 DIAGNOSIS — D489 Neoplasm of uncertain behavior, unspecified: Secondary | ICD-10-CM | POA: Diagnosis not present

## 2022-12-30 MED ORDER — MOUNJARO 2.5 MG/0.5ML ~~LOC~~ SOAJ: 2.5000 mg | SUBCUTANEOUS | 0 refills | Status: DC

## 2022-12-30 NOTE — Progress Notes (Signed)
Established Patient Office Visit  Subjective:  Patient ID: Hayley Knight, female    DOB: May 03, 1935  Age: 87 y.o. MRN: [id number]  Chief Complaint  Patient presents with   Follow-up    Patient is here today with a few things to ask about.   She asks for a referral to [facility] - Dr. Servando Snare. - needs referral - did a scan at the hospital, found a cyst on her liver - asked her to get set up with GI asap.   Also asks to be set up with Dr Gwen Pounds at [facility]. She has a rash that wont go away and multiple moles on her chest wall that often bleed because of friction.   Finally, she asks with her recently labs if we could get to help control her prediabetes and weight.  She says that Chelsa was previously going to start her on something depending on her labs at follow up.   No other concerns at this time.   Past Medical History:  Diagnosis Date   Anxiety 05/09/2018   Arthritis    Aspiration pneumonia of left lower lobe (HCC) 07/04/2022   CAP (community acquired pneumonia) 07/02/2022   Cough 10/21/2021   Diabetes mellitus without complication (HCC)    Type II   Dyspnea 07/09/2021   Fatigue due to exposure 06/15/2022   Hypertension    Hyponatremia 10/21/2021   Sleep apnea    No Cpap   SOB (shortness of breath) 10/21/2021   Viral upper respiratory tract infection 06/15/2022    Past Surgical History:  Procedure Laterality Date   ABDOMINAL HYSTERECTOMY     APPENDECTOMY     CARDIAC CATHETERIZATION     CHOLECYSTECTOMY     DILATION AND CURETTAGE OF UTERUS     MASTECTOMY Right    REPLACEMENT TOTAL KNEE BILATERAL     RIGHT/LEFT HEART CATH AND CORONARY ANGIOGRAPHY Bilateral 07/09/2021   Procedure: RIGHT/LEFT HEART CATH AND CORONARY ANGIOGRAPHY;  Surgeon: Alwyn Pea, MD;  Location: [facility] INVASIVE CV LAB;  Service: Cardiovascular;  Laterality: Bilateral;   TOOTH EXTRACTION N/A 01/02/2022   Procedure: DENTAL RESTORATION/EXTRACTIONS;  Surgeon: Ocie Doyne, DMD;   Location: [facility] OR;  Service: Oral Surgery;  Laterality: N/A;    Social History   Socioeconomic History   Marital status: Widowed    Spouse name: Not on file   Number of children: Not on file   Years of education: Not on file   Highest education level: Not on file  Occupational History   Not on file  Tobacco Use   Smoking status: Never    Passive exposure: Never   Smokeless tobacco: Never  Vaping Use   Vaping status: Never Used  Substance and Sexual Activity   Alcohol use: No   Drug use: No   Sexual activity: Not on file  Other Topics Concern   Not on file  Social History Narrative   Not on file   Social Determinants of Health   Financial Resource Strain: Not on file  Food Insecurity: No Food Insecurity (07/03/2022)   Hunger Vital Sign    Worried About Running Out of Food in the Last Year: Never true    Ran Out of Food in the Last Year: Never true  Transportation Needs: No Transportation Needs (07/03/2022)   PRAPARE - Administrator, Civil Service (Medical): No    Lack of Transportation (Non-Medical): No  Physical Activity: Not on file  Stress: Not on file  Social  Connections: Not on file  Intimate Partner Violence: Not At Risk (07/03/2022)   Humiliation, Afraid, Rape, and Kick questionnaire    Fear of Current or Ex-Partner: No    Emotionally Abused: No    Physically Abused: No    Sexually Abused: No    History reviewed. No pertinent family history.  Allergies  Allergen Reactions   Iodine Swelling   Ivp Dye [Iodinated Contrast Media] Swelling   Sulfa Antibiotics Swelling    Review of Systems  Skin:  Positive for rash.       Multiple moles to chest wall.        Objective:   BP 138/62   Pulse 76   Ht 4\' 11"  (1.499 m)   Wt 200 lb 12.8 oz (91.1 kg)   SpO2 97%   BMI 40.56 kg/m   Vitals:   12/30/22 1401  BP: 138/62  Pulse: 76  Height: 4\' 11"  (1.499 m)  Weight: 200 lb 12.8 oz (91.1 kg)  SpO2: 97%  BMI (Calculated): 40.53    Physical  Exam Vitals and nursing note reviewed.  Constitutional:      Appearance: Normal appearance. She is normal weight.  HENT:     Head: Normocephalic.  Eyes:     Extraocular Movements: Extraocular movements intact.     Conjunctiva/sclera: Conjunctivae normal.     Pupils: Pupils are equal, round, and reactive to light.  Cardiovascular:     Rate and Rhythm: Normal rate.  Pulmonary:     Effort: Pulmonary effort is normal.  Neurological:     General: No focal deficit present.     Mental Status: She is alert and oriented to person, place, and time. Mental status is at baseline.  Psychiatric:        Mood and Affect: Mood normal.        Behavior: Behavior normal.        Thought Content: Thought content normal.        Judgment: Judgment normal.      No results found for any visits on 12/30/22.  Recent Results (from the past 2160 hour(s))  Hemoglobin A1c     Status: Abnormal   Collection Time: 11/09/22  3:55 PM  Result Value Ref Range   Hgb A1c MFr Bld 5.9 (H) 4.8 - 5.6 %    Comment:          Prediabetes: 5.7 - 6.4          Diabetes: >6.4          Glycemic control for adults with diabetes: <7.0    Est. average glucose Bld gHb Est-mCnc 123 mg/dL  TSH     Status: None   Collection Time: 11/09/22  3:55 PM  Result Value Ref Range   TSH 3.670 0.450 - 4.500 uIU/mL  CMP14+EGFR     Status: Abnormal   Collection Time: 11/09/22  3:55 PM  Result Value Ref Range   Glucose 98 70 - 99 mg/dL   BUN 29 (H) 8 - 27 mg/dL   Creatinine, Ser 1.61 (H) 0.57 - 1.00 mg/dL   eGFR 40 (L) >09 UE/AVW/0.98   BUN/Creatinine Ratio 22 12 - 28   Sodium 134 134 - 144 mmol/L   Potassium 4.7 3.5 - 5.2 mmol/L   Chloride 87 (L) 96 - 106 mmol/L   CO2 27 20 - 29 mmol/L   Calcium 10.2 8.7 - 10.3 mg/dL   Total Protein 7.1 6.0 - 8.5 g/dL   Albumin 4.7 3.7 - 4.7 g/dL  Globulin, Total 2.4 1.5 - 4.5 g/dL   Bilirubin Total 0.3 0.0 - 1.2 mg/dL   Alkaline Phosphatase 54 44 - 121 IU/L   AST 22 0 - 40 IU/L   ALT 13 0 -  32 IU/L  Lipid panel     Status: Abnormal   Collection Time: 11/09/22  3:55 PM  Result Value Ref Range   Cholesterol, Total 101 100 - 199 mg/dL   Triglycerides 213 0 - 149 mg/dL   HDL 35 (L) >08 mg/dL   VLDL Cholesterol Cal 24 5 - 40 mg/dL   LDL Chol Calc (NIH) 42 0 - 99 mg/dL   Chol/HDL Ratio 2.9 0.0 - 4.4 ratio    Comment:                                   T. Chol/HDL Ratio                                             Men  Women                               1/2 Avg.Risk  3.4    3.3                                   Avg.Risk  5.0    4.4                                2X Avg.Risk  9.6    7.1                                3X Avg.Risk 23.4   11.0   CBC With Differential     Status: None   Collection Time: 11/09/22  3:55 PM  Result Value Ref Range   WBC 8.2 3.4 - 10.8 x10E3/uL   RBC 4.08 3.77 - 5.28 x10E6/uL   Hemoglobin 12.3 11.1 - 15.9 g/dL   Hematocrit 65.7 84.6 - 46.6 %   MCV 94 79 - 97 fL   MCH 30.1 26.6 - 33.0 pg   MCHC 32.2 31.5 - 35.7 g/dL   RDW 96.2 95.2 - 84.1 %   Neutrophils 55 Not Estab. %   Lymphs 31 Not Estab. %   Monocytes 7 Not Estab. %   Eos 5 Not Estab. %   Basos 1 Not Estab. %   Neutrophils Absolute 4.6 1.4 - 7.0 x10E3/uL   Lymphocytes Absolute 2.5 0.7 - 3.1 x10E3/uL   Monocytes Absolute 0.6 0.1 - 0.9 x10E3/uL   EOS (ABSOLUTE) 0.4 0.0 - 0.4 x10E3/uL   Basophils Absolute 0.1 0.0 - 0.2 x10E3/uL   Immature Granulocytes 1 Not Estab. %   Immature Grans (Abs) 0.0 0.0 - 0.1 x10E3/uL    Comment: **Effective November 23, 2022, profile [id number] CBC/Differential**   (No Platelet) will be made non-orderable. [facility] Offers:   [id number] CBC With Differential/Platelet        Assessment & Plan:   Problem List Items Addressed This Visit       Active Problems  Type 2 diabetes mellitus with hyperglycemia (HCC)   Relevant Medications   tirzepatide (MOUNJARO) 2.5 MG/0.5ML Pen   CKD stage 3b, GFR 30-44 ml/min (HCC)    Patient stable.  Well controlled with current  therapy.   Continue current meds.       Other Visit Diagnoses     Liver cyst       sending referral to [facility].  will defer to them for further care.   Relevant Orders   Ambulatory referral to Gastroenterology   Rash and nonspecific skin eruption       Sending referral to [facility].   Relevant Orders   Ambulatory referral to Dermatology   Neoplasm of uncertain behavior       Sending referral to [facility]   Relevant Orders   Ambulatory referral to Dermatology       Return as previously scheduled.   Total time spent: 20 minutes  Miki Kins, FNP  12/30/2022   This document may have been prepared by [facility] Voice Recognition software and as such may include unintentional dictation errors.

## 2022-12-30 NOTE — Assessment & Plan Note (Signed)
Patient stable.  Well controlled with current therapy.   Continue current meds.  

## 2023-01-18 DIAGNOSIS — K649 Unspecified hemorrhoids: Secondary | ICD-10-CM | POA: Diagnosis not present

## 2023-01-18 DIAGNOSIS — K529 Noninfective gastroenteritis and colitis, unspecified: Secondary | ICD-10-CM | POA: Diagnosis not present

## 2023-01-18 DIAGNOSIS — D1803 Hemangioma of intra-abdominal structures: Secondary | ICD-10-CM | POA: Diagnosis not present

## 2023-01-18 DIAGNOSIS — Z9049 Acquired absence of other specified parts of digestive tract: Secondary | ICD-10-CM | POA: Diagnosis not present

## 2023-01-21 ENCOUNTER — Other Ambulatory Visit: Payer: Self-pay

## 2023-01-21 ENCOUNTER — Other Ambulatory Visit: Payer: Self-pay | Admitting: Family

## 2023-01-21 MED ORDER — HYDROCHLOROTHIAZIDE 12.5 MG PO TABS: 12.5000 mg | ORAL_TABLET | Freq: Every day | ORAL | 1 refills | Status: DC

## 2023-01-25 ENCOUNTER — Ambulatory Visit (INDEPENDENT_AMBULATORY_CARE_PROVIDER_SITE_OTHER): Payer: Medicare Other | Admitting: Cardiology

## 2023-01-25 ENCOUNTER — Encounter: Payer: Self-pay | Admitting: Cardiology

## 2023-01-25 ENCOUNTER — Ambulatory Visit: Payer: Medicare Other | Admitting: Cardiology

## 2023-01-25 ENCOUNTER — Other Ambulatory Visit: Payer: Self-pay

## 2023-01-25 VITALS — BP 100/58 | HR 68 | Ht 59.5 in | Wt 197.2 lb

## 2023-01-25 DIAGNOSIS — I10 Essential (primary) hypertension: Secondary | ICD-10-CM

## 2023-01-25 DIAGNOSIS — E785 Hyperlipidemia, unspecified: Secondary | ICD-10-CM | POA: Diagnosis not present

## 2023-01-25 DIAGNOSIS — E1165 Type 2 diabetes mellitus with hyperglycemia: Secondary | ICD-10-CM | POA: Diagnosis not present

## 2023-01-25 DIAGNOSIS — N1832 Chronic kidney disease, stage 3b: Secondary | ICD-10-CM | POA: Diagnosis not present

## 2023-01-25 NOTE — Progress Notes (Signed)
Established Patient Office Visit  Subjective:  Patient ID: Hayley Knight, female    DOB: 21-Dec-1935  Age: 87 y.o. MRN: [id number]  Chief Complaint  Patient presents with   Follow-up    3 month follow up, discuss kidneys.    Patient in office for 3 month follow up. Patient started on Mounjuro on 12/30/22, states it gave her diarrhea so she stopped it. Patient has not had fasting lab work. Will return for fasting labs. Patient requesting a different medication for diabetes and weight loss. Will discuss at visit in one month, after lab work.     No other concerns at this time.   Past Medical History:  Diagnosis Date   Anxiety 05/09/2018   Arthritis    Aspiration pneumonia of left lower lobe (HCC) 07/04/2022   CAP (community acquired pneumonia) 07/02/2022   Cough 10/21/2021   Diabetes mellitus without complication (HCC)    Type II   Dyspnea 07/09/2021   Fatigue due to exposure 06/15/2022   Hypertension    Hyponatremia 10/21/2021   Sleep apnea    No Cpap   SOB (shortness of breath) 10/21/2021   Viral upper respiratory tract infection 06/15/2022    Past Surgical History:  Procedure Laterality Date   ABDOMINAL HYSTERECTOMY     APPENDECTOMY     CARDIAC CATHETERIZATION     CHOLECYSTECTOMY     DILATION AND CURETTAGE OF UTERUS     MASTECTOMY Right    REPLACEMENT TOTAL KNEE BILATERAL     RIGHT/LEFT HEART CATH AND CORONARY ANGIOGRAPHY Bilateral 07/09/2021   Procedure: RIGHT/LEFT HEART CATH AND CORONARY ANGIOGRAPHY;  Surgeon: Alwyn Pea, MD;  Location: [facility] INVASIVE CV LAB;  Service: Cardiovascular;  Laterality: Bilateral;   TOOTH EXTRACTION N/A 01/02/2022   Procedure: DENTAL RESTORATION/EXTRACTIONS;  Surgeon: Ocie Doyne, DMD;  Location: [facility] OR;  Service: Oral Surgery;  Laterality: N/A;    Social History   Socioeconomic History   Marital status: Widowed    Spouse name: Not on file   Number of children: Not on file   Years of education: Not on file   Highest  education level: Not on file  Occupational History   Not on file  Tobacco Use   Smoking status: Never    Passive exposure: Never   Smokeless tobacco: Never  Vaping Use   Vaping status: Never Used  Substance and Sexual Activity   Alcohol use: No   Drug use: No   Sexual activity: Not on file  Other Topics Concern   Not on file  Social History Narrative   Not on file   Social Determinants of Health   Financial Resource Strain: Not on file  Food Insecurity: No Food Insecurity (07/03/2022)   Hunger Vital Sign    Worried About Running Out of Food in the Last Year: Never true    Ran Out of Food in the Last Year: Never true  Transportation Needs: No Transportation Needs (07/03/2022)   PRAPARE - Administrator, Civil Service (Medical): No    Lack of Transportation (Non-Medical): No  Physical Activity: Not on file  Stress: Not on file  Social Connections: Not on file  Intimate Partner Violence: Not At Risk (07/03/2022)   Humiliation, Afraid, Rape, and Kick questionnaire    Fear of Current or Ex-Partner: No    Emotionally Abused: No    Physically Abused: No    Sexually Abused: No    No family history on file.  Allergies  Allergen Reactions  Iodine Swelling   Ivp Dye [Iodinated Contrast Media] Swelling   Sulfa Antibiotics Swelling    Review of Systems  Constitutional: Negative.   HENT: Negative.    Eyes: Negative.   Respiratory: Negative.  Negative for shortness of breath.   Cardiovascular: Negative.  Negative for chest pain.  Gastrointestinal: Negative.  Negative for abdominal pain, constipation and diarrhea.  Genitourinary: Negative.   Musculoskeletal:  Negative for joint pain and myalgias.  Skin: Negative.   Neurological: Negative.  Negative for dizziness and headaches.  Endo/Heme/Allergies: Negative.   All other systems reviewed and are negative.      Objective:   BP (!) 100/58   Pulse 68   Ht 4' 11.5" (1.511 m)   Wt 197 lb 3.2 oz (89.4 kg)   SpO2  96%   BMI 39.16 kg/m   Vitals:   01/25/23 1431  BP: (!) 100/58  Pulse: 68  Height: 4' 11.5" (1.511 m)  Weight: 197 lb 3.2 oz (89.4 kg)  SpO2: 96%  BMI (Calculated): 39.18    Physical Exam Vitals and nursing note reviewed.  Constitutional:      Appearance: Normal appearance. She is normal weight.  HENT:     Head: Normocephalic and atraumatic.     Nose: Nose normal.     Mouth/Throat:     Mouth: Mucous membranes are moist.  Eyes:     Extraocular Movements: Extraocular movements intact.     Conjunctiva/sclera: Conjunctivae normal.     Pupils: Pupils are equal, round, and reactive to light.  Cardiovascular:     Rate and Rhythm: Normal rate and regular rhythm.     Pulses: Normal pulses.     Heart sounds: Normal heart sounds.  Pulmonary:     Effort: Pulmonary effort is normal.     Breath sounds: Normal breath sounds.  Abdominal:     General: Abdomen is flat. Bowel sounds are normal.     Palpations: Abdomen is soft.  Musculoskeletal:        General: Normal range of motion.     Cervical back: Normal range of motion.  Skin:    General: Skin is warm and dry.  Neurological:     General: No focal deficit present.     Mental Status: She is alert and oriented to person, place, and time.  Psychiatric:        Mood and Affect: Mood normal.        Behavior: Behavior normal.        Thought Content: Thought content normal.        Judgment: Judgment normal.      No results found for any visits on 01/25/23.  Recent Results (from the past 2160 hour(s))  Hemoglobin A1c     Status: Abnormal   Collection Time: 11/09/22  3:55 PM  Result Value Ref Range   Hgb A1c MFr Bld 5.9 (H) 4.8 - 5.6 %    Comment:          Prediabetes: 5.7 - 6.4          Diabetes: >6.4          Glycemic control for adults with diabetes: <7.0    Est. average glucose Bld gHb Est-mCnc 123 mg/dL  TSH     Status: None   Collection Time: 11/09/22  3:55 PM  Result Value Ref Range   TSH 3.670 0.450 - 4.500 uIU/mL   CMP14+EGFR     Status: Abnormal   Collection Time: 11/09/22  3:55 PM  Result Value Ref Range   Glucose 98 70 - 99 mg/dL   BUN 29 (H) 8 - 27 mg/dL   Creatinine, Ser 2.95 (H) 0.57 - 1.00 mg/dL   eGFR 40 (L) >18 AC/ZYS/0.63   BUN/Creatinine Ratio 22 12 - 28   Sodium 134 134 - 144 mmol/L   Potassium 4.7 3.5 - 5.2 mmol/L   Chloride 87 (L) 96 - 106 mmol/L   CO2 27 20 - 29 mmol/L   Calcium 10.2 8.7 - 10.3 mg/dL   Total Protein 7.1 6.0 - 8.5 g/dL   Albumin 4.7 3.7 - 4.7 g/dL   Globulin, Total 2.4 1.5 - 4.5 g/dL   Bilirubin Total 0.3 0.0 - 1.2 mg/dL   Alkaline Phosphatase 54 44 - 121 IU/L   AST 22 0 - 40 IU/L   ALT 13 0 - 32 IU/L  Lipid panel     Status: Abnormal   Collection Time: 11/09/22  3:55 PM  Result Value Ref Range   Cholesterol, Total 101 100 - 199 mg/dL   Triglycerides 016 0 - 149 mg/dL   HDL 35 (L) >01 mg/dL   VLDL Cholesterol Cal 24 5 - 40 mg/dL   LDL Chol Calc (NIH) 42 0 - 99 mg/dL   Chol/HDL Ratio 2.9 0.0 - 4.4 ratio    Comment:                                   T. Chol/HDL Ratio                                             Men  Women                               1/2 Avg.Risk  3.4    3.3                                   Avg.Risk  5.0    4.4                                2X Avg.Risk  9.6    7.1                                3X Avg.Risk 23.4   11.0   CBC With Differential     Status: None   Collection Time: 11/09/22  3:55 PM  Result Value Ref Range   WBC 8.2 3.4 - 10.8 x10E3/uL   RBC 4.08 3.77 - 5.28 x10E6/uL   Hemoglobin 12.3 11.1 - 15.9 g/dL   Hematocrit 09.3 23.5 - 46.6 %   MCV 94 79 - 97 fL   MCH 30.1 26.6 - 33.0 pg   MCHC 32.2 31.5 - 35.7 g/dL   RDW 57.3 22.0 - 25.4 %   Neutrophils 55 Not Estab. %   Lymphs 31 Not Estab. %   Monocytes 7 Not Estab. %   Eos 5 Not Estab. %   Basos 1 Not Estab. %   Neutrophils Absolute 4.6 1.4 - 7.0 x10E3/uL  Lymphocytes Absolute 2.5 0.7 - 3.1 x10E3/uL   Monocytes Absolute 0.6 0.1 - 0.9 x10E3/uL   EOS (ABSOLUTE) 0.4 0.0  - 0.4 x10E3/uL   Basophils Absolute 0.1 0.0 - 0.2 x10E3/uL   Immature Granulocytes 1 Not Estab. %   Immature Grans (Abs) 0.0 0.0 - 0.1 x10E3/uL    Comment: **Effective November 23, 2022, profile [id number] CBC/Differential**   (No Platelet) will be made non-orderable. [facility] Offers:   [id number] CBC With Differential/Platelet       Assessment & Plan:  Fasting labs prior to next visit.  Continue to work on diet and exercise for weight loss.   Problem List Items Addressed This Visit       Cardiovascular and Mediastinum   Benign essential hypertension - Primary     Endocrine   Type 2 diabetes mellitus with hyperglycemia (HCC)     Genitourinary   CKD stage 3b, GFR 30-44 ml/min (HCC)     Other   Dyslipidemia    No follow-ups on file.   Total time spent: 25 minutes  [employer], NP  01/25/2023   This document may have been prepared by [employer] Voice Recognition software and as such may include unintentional dictation errors.

## 2023-01-26 ENCOUNTER — Ambulatory Visit: Payer: Medicare Other | Admitting: Family

## 2023-01-26 MED ORDER — POTASSIUM CHLORIDE ER 10 MEQ PO TBCR: 10.0000 meq | EXTENDED_RELEASE_TABLET | Freq: Once | ORAL | 0 refills | Status: DC

## 2023-01-27 ENCOUNTER — Ambulatory Visit: Payer: Medicare Other | Admitting: Dermatology

## 2023-01-28 ENCOUNTER — Ambulatory Visit: Payer: Medicare Other | Admitting: Family

## 2023-01-29 ENCOUNTER — Ambulatory Visit: Payer: Medicare Other | Admitting: Family

## 2023-02-01 DIAGNOSIS — M542 Cervicalgia: Secondary | ICD-10-CM | POA: Diagnosis not present

## 2023-02-01 DIAGNOSIS — Z79891 Long term (current) use of opiate analgesic: Secondary | ICD-10-CM | POA: Diagnosis not present

## 2023-02-01 DIAGNOSIS — M791 Myalgia, unspecified site: Secondary | ICD-10-CM | POA: Diagnosis not present

## 2023-02-01 DIAGNOSIS — M545 Low back pain, unspecified: Secondary | ICD-10-CM | POA: Diagnosis not present

## 2023-02-08 ENCOUNTER — Telehealth: Payer: Self-pay | Admitting: Family

## 2023-02-08 NOTE — Telephone Encounter (Signed)
Patient called in c/o dysuria, urinary frequency, off balance, back pain - can we call in something? Thinks she has a UTI. Says she cannot drive.  [facility] [address]

## 2023-02-09 ENCOUNTER — Other Ambulatory Visit: Payer: Self-pay | Admitting: Cardiology

## 2023-02-09 ENCOUNTER — Other Ambulatory Visit: Payer: Self-pay

## 2023-02-09 DIAGNOSIS — I10 Essential (primary) hypertension: Secondary | ICD-10-CM

## 2023-02-09 MED ORDER — CIPROFLOXACIN HCL 500 MG PO TABS: 500.0000 mg | ORAL_TABLET | Freq: Two times a day (BID) | ORAL | 0 refills | Status: AC

## 2023-02-09 MED ORDER — LOSARTAN POTASSIUM 100 MG PO TABS
100.0000 mg | ORAL_TABLET | Freq: Every day | ORAL | 1 refills | Status: DC
Start: 2023-02-09 — End: 2023-06-09

## 2023-02-09 NOTE — Telephone Encounter (Signed)
Patient left another VM that her urine is dark and cloudy today and she needs something called in. Please advise.

## 2023-02-11 ENCOUNTER — Emergency Department (HOSPITAL_COMMUNITY)
Admission: EM | Admit: 2023-02-11 | Discharge: 2023-02-11 | Disposition: A | Discharge status: Home / Self Care | Payer: Medicare Other | Attending: Emergency Medicine | Admitting: Emergency Medicine

## 2023-02-11 ENCOUNTER — Encounter (HOSPITAL_COMMUNITY): Payer: Self-pay

## 2023-02-11 ENCOUNTER — Emergency Department (HOSPITAL_COMMUNITY): Payer: Medicare Other

## 2023-02-11 ENCOUNTER — Other Ambulatory Visit: Payer: Self-pay

## 2023-02-11 DIAGNOSIS — W01198A Fall on same level from slipping, tripping and stumbling with subsequent striking against other object, initial encounter: Secondary | ICD-10-CM | POA: Insufficient documentation

## 2023-02-11 DIAGNOSIS — Z471 Aftercare following joint replacement surgery: Secondary | ICD-10-CM | POA: Diagnosis not present

## 2023-02-11 DIAGNOSIS — I6782 Cerebral ischemia: Secondary | ICD-10-CM | POA: Diagnosis not present

## 2023-02-11 DIAGNOSIS — S0990XA Unspecified injury of head, initial encounter: Secondary | ICD-10-CM | POA: Insufficient documentation

## 2023-02-11 DIAGNOSIS — S8001XA Contusion of right knee, initial encounter: Secondary | ICD-10-CM | POA: Diagnosis not present

## 2023-02-11 DIAGNOSIS — S8991XA Unspecified injury of right lower leg, initial encounter: Secondary | ICD-10-CM | POA: Diagnosis present

## 2023-02-11 DIAGNOSIS — Z7982 Long term (current) use of aspirin: Secondary | ICD-10-CM | POA: Insufficient documentation

## 2023-02-11 DIAGNOSIS — M25561 Pain in right knee: Secondary | ICD-10-CM | POA: Diagnosis not present

## 2023-02-11 DIAGNOSIS — G9389 Other specified disorders of brain: Secondary | ICD-10-CM | POA: Diagnosis not present

## 2023-02-11 DIAGNOSIS — Z96651 Presence of right artificial knee joint: Secondary | ICD-10-CM | POA: Diagnosis not present

## 2023-02-11 DIAGNOSIS — W19XXXA Unspecified fall, initial encounter: Secondary | ICD-10-CM

## 2023-02-11 MED ORDER — ACETAMINOPHEN 500 MG PO TABS
1000.0000 mg | ORAL_TABLET | Freq: Once | ORAL | Status: AC
  Administered 2023-02-11: 1000 mg via ORAL
  Filled 2023-02-11: qty 2

## 2023-02-11 NOTE — ED Notes (Signed)
Patient transported to CT 

## 2023-02-11 NOTE — Progress Notes (Signed)
Orthopedic Tech Progress Note Patient Details:  BERNECE GALL 10-18-1935 [id number] Level 2 Trauma. Not needed Patient ID: INIYA MATZEK, female   DOB: December 23, 1935, 87 y.o.   MRN: [id number]  Lovett Calender 02/11/2023, 12:44 PM

## 2023-02-11 NOTE — ED Provider Notes (Signed)
[place] EMERGENCY DEPARTMENT AT [facility] Provider Note   CSN: [id number] Arrival date & time: 02/11/23  4098     History  Chief Complaint  Patient presents with   Hayley Knight    SADIA FARONE is a 87 y.o. female.  She is a family member of the patient in the hospital.  She is brought down by staff after a trip and fall in which she struck her right knee and her head.  There was no loss of consciousness.  She is on Plavix.  She was activated as a level 2 trauma.  She denies any headache numbness weakness difficulty breathing abdominal pain.  She said she did not take her Plavix today.  The history is provided by the patient.  Fall This is a new problem. The current episode started less than 1 hour ago. The problem has not changed since onset.Pertinent negatives include no chest pain, no abdominal pain, no headaches and no shortness of breath. Nothing aggravates the symptoms. Nothing relieves the symptoms. She has tried nothing for the symptoms. The treatment provided no relief.       Home Medications Prior to Admission medications   Medication Sig Start Date End Date Taking? Authorizing Provider  clopidogrel (PLAVIX) 75 MG tablet TAKE 1 TABLET BY MOUTH EVERY DAY 09/10/22  Yes Boswell, Gareth Morgan, NP  albuterol (VENTOLIN HFA) 108 (90 Base) MCG/ACT inhaler Inhale 2 puffs into the lungs every 6 (six) hours as needed for wheezing or shortness of breath. 06/10/22   Orson Eva, NP  amLODipine (NORVASC) 2.5 MG tablet TAKE 1 TABLET BY MOUTH DAILY FOR ELEVATED BLOOD PRESSURE 10/03/22   Miki Kins, FNP  aspirin 81 MG EC tablet Take 81 mg by mouth at bedtime. 03/08/15   [provider]  Cholecalciferol (VITAMIN D3) 125 MCG (5000 UT) CAPS Take 1 capsule (5,000 Units total) by mouth daily. 01/02/22   Ocie Doyne, DMD  ciprofloxacin (CIPRO) 500 MG tablet Take 1 tablet (500 mg total) by mouth 2 (two) times daily for 3 days. 02/09/23 02/12/23  Scoggins, Amber, NP  diazepam  (VALIUM) 5 MG tablet Take 5 mg by mouth every 12 (twelve) hours as needed for anxiety.    [provider]  diphenhydrAMINE (BENADRYL) 50 MG capsule Take 1 capsule prior to MRI 09/12/22   Sherron Monday, MD  escitalopram (LEXAPRO) 20 MG tablet TAKE 1 TABLET BY MOUTH EVERY DAY 09/23/22   Orson Eva, NP  fenofibrate (TRICOR) 145 MG tablet Take 145 mg by mouth at bedtime.    [provider]  furosemide (LASIX) 40 MG tablet TAKE 1 TABLET DAILY WITH POTASSIUM (NOTE INCREASED DOSAGE) 08/10/22   Orson Eva, NP  gabapentin (NEURONTIN) 300 MG capsule Take 300 mg by mouth 2 (two) times daily as needed (Neck pain).    [provider]  hydrochlorothiazide (HYDRODIURIL) 12.5 MG tablet Take 1 tablet (12.5 mg total) by mouth daily. 01/21/23   Miki Kins, FNP  levocetirizine (XYZAL ALLERGY 24HR) 5 MG tablet Take 1 tablet (5 mg total) by mouth every evening. 08/04/22   Orson Eva, NP  losartan (COZAAR) 100 MG tablet Take 1 tablet (100 mg total) by mouth daily. 02/09/23   Scoggins, Amber, NP  Melatonin 10 MG TABS Take 10 mg by mouth at bedtime. 07/13/21   [provider]  Multiple Vitamin (MULTIVITAMIN) capsule Take 2 capsules by mouth daily.    [provider]  nitroGLYCERIN (NITROSTAT) 0.4 MG SL tablet Place 0.4 mg under the  tongue every 5 (five) minutes as needed for chest pain. 07/13/21 11/13/22  [provider]  omega-3 acid ethyl esters (LOVAZA) 1 G capsule Take 2 g by mouth 2 (two) times daily.    [provider]  omeprazole (PRILOSEC) 40 MG capsule TAKE 1 CAPSULE (40 MG TOTAL) BY MOUTH DAILY. 11/26/22   Scoggins, Amber, NP  oxyCODONE (OXYCONTIN) 10 mg 12 hr tablet Take 20 mg by mouth every 12 (twelve) hours.    [provider]  potassium chloride (KLOR-CON) 10 MEQ tablet Take 1 tablet (10 mEq total) by mouth once for 1 dose. 01/26/23 01/26/23  Scoggins, Amber, NP  sitaGLIPtin (JANUVIA) 100 MG tablet Take 100 mg by mouth daily.     [provider]  triamcinolone (NASACORT) 55 MCG/ACT AERO nasal inhaler Place 1 spray into the nose 2 (two) times daily. 08/04/22 08/04/23  Orson Eva, NP      Allergies    Iodine, Ivp dye [iodinated contrast media], and Sulfa antibiotics    Review of Systems   Review of Systems  Constitutional:  Negative for fever.  Eyes:  Negative for visual disturbance.  Respiratory:  Negative for shortness of breath.   Cardiovascular:  Negative for chest pain.  Gastrointestinal:  Negative for abdominal pain.  Musculoskeletal:  Negative for back pain and neck pain.  Neurological:  Negative for headaches.    Physical Exam Updated Vital Signs BP (!) 108/56 (BP Location: Left Arm)   Pulse 64   Temp 98.3 F (36.8 C)   Resp 18   Ht 4' 11.5" (1.511 m)   Wt 85.7 kg   SpO2 97%   BMI 37.53 kg/m  Physical Exam Vitals and nursing note reviewed.  Constitutional:      General: She is not in acute distress.    Appearance: Normal appearance. She is well-developed.  HENT:     Head: Normocephalic and atraumatic.  Eyes:     Conjunctiva/sclera: Conjunctivae normal.  Cardiovascular:     Rate and Rhythm: Normal rate and regular rhythm.     Heart sounds: No murmur heard. Pulmonary:     Effort: Pulmonary effort is normal. No respiratory distress.     Breath sounds: Normal breath sounds.  Abdominal:     Palpations: Abdomen is soft.     Tenderness: There is no abdominal tenderness. There is no guarding or rebound.  Musculoskeletal:        General: Tenderness present.     Cervical back: Neck supple. No tenderness.     Comments: has some mild tenderness of her right knee.  Full range of motion of upper and lower extremities without any pain or limitations.  Neck and back nontender.  Skin:    General: Skin is warm and dry.     Capillary Refill: Capillary refill takes less than 2 seconds.  Neurological:     General: No focal deficit present.     Mental Status: She is alert and oriented to  person, place, and time.     Sensory: No sensory deficit.     Motor: No weakness.     ED Results / Procedures / Treatments   Labs (all labs ordered are listed, but only abnormal results are displayed) Labs Reviewed - No data to display  EKG None  Radiology DG Knee Complete 4 Views Right  Result Date: 02/11/2023 CLINICAL DATA:  Right knee pain after fall. EXAM: RIGHT KNEE - COMPLETE 4+ VIEW COMPARISON:  June 29, 2005. FINDINGS: Right femoral and tibial components are  well situated. No fracture or dislocation is noted. Soft tissue calcifications are noted anteriorly of benign etiology. IMPRESSION: Status post right total knee arthroplasty. No acute abnormality seen. Electronically Signed   By: Lupita Raider M.D.   On: 02/11/2023 12:37   CT Head Wo Contrast  Result Date: 02/11/2023 CLINICAL DATA:  Provided history: Head trauma, minor. Fall (with head trauma). On blood thinners. EXAM: CT HEAD WITHOUT CONTRAST TECHNIQUE: Contiguous axial images were obtained from the base of the skull through the vertex without intravenous contrast. RADIATION DOSE REDUCTION: This exam was performed according to the departmental dose-optimization program which includes automated exposure control, adjustment of the mA and/or kV according to patient size and/or use of iterative reconstruction technique. COMPARISON:  Head CT 12/30/2012. FINDINGS: Brain: Generalized cerebral atrophy. Patchy and ill-defined hypoattenuation within the cerebral white matter, nonspecific but compatible with mild chronic small vessel ischemic disease. 4 mm calcific focus overlying the mid to anterior right frontal lobe, unchanged from the prior head CT of 12/30/2012. This may reflect a dural calcification or small calcified meningioma. There is no acute intracranial hemorrhage. No demarcated cortical infarct. No extra-axial fluid collection. No midline shift. Vascular: No hyperdense vessel.  Atherosclerotic calcifications. Skull: No  calvarial fracture or aggressive osseous lesion. Sinuses/Orbits: No mass or acute finding within the imaged orbits. Minimal mucosal thickening within the bilateral ethmoid and sphenoid sinuses. IMPRESSION: 1. No evidence of an acute intracranial abnormality. 2. Mild chronic small vessel ischemic changes within the cerebral white matter. 3. 4 mm calcific focus overlying the mid-to-anterior right frontal lobe, unchanged from the prior head CT of 12/30/2012. This may reflect a dural calcification or small calcified meningioma. 4. Cerebral atrophy. 5. Minimal ethmoid and sphenoid sinus mucosal thickening. Electronically Signed   By: Jackey Loge D.O.   On: 02/11/2023 11:20    Procedures Procedures    Medications Ordered in ED Medications  acetaminophen (TYLENOL) tablet 1,000 mg (1,000 mg Oral Given 02/11/23 1024)    ED Course/ Medical Decision Making/ A&P Clinical Course as of 02/12/23 1030  Thu Feb 11, 2023  1026 X-rays of right knee show hardware intact no gross fracture.  CT head does not show any acute bleed.  Awaiting radiology reading.  Patient requesting some pain medication for her knee pain.  Tylenol ordered.  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 [MB]  1306 Reviewed results of workup with patient.  She said she needs a wheelchair to get help to go get her family member.  She does not want an Ace wrap or knee immobilizer. [MB]    Clinical Course User Index [MB] Terrilee Files, MD                                 Medical Decision Making Amount and/or Complexity of Data Reviewed Radiology: ordered.  Risk OTC drugs.   This patient complains of fall head injury right knee pain; this involves an extensive number of treatment Options and is a complaint that carries with it a high risk of complications and morbidity. The differential includes fracture, dislocation, intracranial bleed, contusion I ordered  medication Tylenol and reviewed PMP when indicated. I ordered imaging studies which included head CT and right knee x-ray and I independently    visualized and interpreted imaging which showed no acute traumatic findings Additional history obtained from rapid response  nurse Previous records obtained and reviewed in epic including prior PCP notes I Cardiac monitoring reviewed, sinus rhythm Social determinants considered, no significant barriers Critical Interventions: None  After the interventions stated above, I reevaluated the patient and found patient awake alert no distress Admission and further testing considered, no indications for admission.  She feels she can manage her symptoms at home and has her pain medicine at home.  She is asking for wheelchair assistance if she can go pick up her family member from surgical day         Final Clinical Impression(s) / ED Diagnoses Final diagnoses:  Fall, initial encounter  Injury of head, initial encounter  Contusion of right knee, initial encounter    Rx / DC Orders ED Discharge Orders     None         Terrilee Files, MD 02/12/23 1032

## 2023-02-11 NOTE — ED Notes (Signed)
Trauma Response Nurse Documentation  Hayley Knight is a 87 y.o. female arriving to [facility] ED via EMS  On clopidogrel 75 mg daily. Trauma was activated as a Level 2 based on the following trauma criteria Elderly patients > 65 with head trauma on anti-coagulation (excluding ASA).  Patient cleared for CT by Dr. Charm Barges. Pt transported to CT with trauma response nurse present to monitor. RN remained with the patient throughout their absence from the department for clinical observation. GCS 15.  History   Past Medical History:  Diagnosis Date   Anxiety 05/09/2018   Arthritis    Aspiration pneumonia of left lower lobe (HCC) 07/04/2022   CAP (community acquired pneumonia) 07/02/2022   Cough 10/21/2021   Diabetes mellitus without complication (HCC)    Type II   Dyspnea 07/09/2021   Fatigue due to exposure 06/15/2022   Hypertension    Hyponatremia 10/21/2021   Sleep apnea    No Cpap   SOB (shortness of breath) 10/21/2021   Viral upper respiratory tract infection 06/15/2022     Past Surgical History:  Procedure Laterality Date   ABDOMINAL HYSTERECTOMY     APPENDECTOMY     CARDIAC CATHETERIZATION     CHOLECYSTECTOMY     DILATION AND CURETTAGE OF UTERUS     MASTECTOMY Right    REPLACEMENT TOTAL KNEE BILATERAL     RIGHT/LEFT HEART CATH AND CORONARY ANGIOGRAPHY Bilateral 07/09/2021   Procedure: RIGHT/LEFT HEART CATH AND CORONARY ANGIOGRAPHY;  Surgeon: Alwyn Pea, MD;  Location: [facility] INVASIVE CV LAB;  Service: Cardiovascular;  Laterality: Bilateral;   TOOTH EXTRACTION N/A 01/02/2022   Procedure: DENTAL RESTORATION/EXTRACTIONS;  Surgeon: Ocie Doyne, DMD;  Location: [facility] OR;  Service: Oral Surgery;  Laterality: N/A;     Initial Focused Assessment (If applicable, or please see trauma documentation): Patient A&Ox4, GCS 15, PERR 3 Airway intact, bilateral breath sounds No acute complaints  CT's Completed:   CT Head   Interventions:  CT Head  Plan for disposition:   Discharge home   Event Summary: Per reports 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 Patient was returning to the waiting room when she tripped and fell, hitting her knee and head. This event was witnessed by a nurse in pre-op. Patient was escorted to the ED by pre-op nursing staff. Imaging was performed and revealed no intracranial injury. Patient comfortable returning upstairs to wait for daughter.  Bedside handoff with ED RN Dahlia Client.    Jill Side Victor Granados  Trauma Response RN  Please call TRN at [phone] for further assistance.

## 2023-02-11 NOTE — Discharge Instructions (Signed)
You were seen in the emergency department for evaluation of injuries to your head and knee after a fall.  You had a CAT scan of your head and x-rays of your knee that did not show any acute traumatic findings.  Please use ice to the affected areas and Tylenol as needed for pain.  Follow-up with your regular doctor.  Return to the emergency department if any worsening or concerning symptoms

## 2023-02-11 NOTE — ED Triage Notes (Signed)
Pt presents s/p trip and fall with head strike. No LOC. Taking plavix last dose last night. A+Ox4. Denies headache/CP/SOB.

## 2023-02-11 NOTE — Progress Notes (Signed)
Orthopedic Tech Progress Note Patient Details:  Hayley Knight August 12, 1935 [id number] Level 2 Trauma. Not needed Patient ID: SWATI GRANBERRY, female   DOB: 08/02/1935, 87 y.o.   MRN: [id number]  Lovett Calender 02/11/2023, 9:53 AM

## 2023-02-11 NOTE — ED Notes (Signed)
Pt ambulatory with slow but steady gait. Able to bend and straighten knee. States she is very comfortable driving home and that she is at her baseline. Wheeled out to [employer] where her car is parked. PACU updated so pt's daughter can meet her there.

## 2023-02-14 NOTE — Plan of Care (Signed)
CHL Tonsillectomy/Adenoidectomy, Postoperative PEDS care plan entered in error.

## 2023-02-15 ENCOUNTER — Ambulatory Visit: Payer: Medicare Other | Admitting: Cardiology

## 2023-02-19 ENCOUNTER — Other Ambulatory Visit: Payer: Medicare Other

## 2023-02-19 DIAGNOSIS — E785 Hyperlipidemia, unspecified: Secondary | ICD-10-CM

## 2023-02-19 DIAGNOSIS — E119 Type 2 diabetes mellitus without complications: Secondary | ICD-10-CM | POA: Diagnosis not present

## 2023-02-19 DIAGNOSIS — I10 Essential (primary) hypertension: Secondary | ICD-10-CM | POA: Diagnosis not present

## 2023-02-20 LAB — CMP14+EGFR
ALT: 14 [IU]/L (ref 0–32)
AST: 24 [IU]/L (ref 0–40)
Albumin: 4.4 g/dL (ref 3.7–4.7)
Alkaline Phosphatase: 51 [IU]/L (ref 44–121)
BUN/Creatinine Ratio: 12 (ref 12–28)
BUN: 14 mg/dL (ref 8–27)
Bilirubin Total: 0.7 mg/dL (ref 0.0–1.2)
CO2: 26 mmol/L (ref 20–29)
Calcium: 9.5 mg/dL (ref 8.7–10.3)
Chloride: 87 mmol/L — ABNORMAL LOW (ref 96–106)
Creatinine, Ser: 1.15 mg/dL — ABNORMAL HIGH (ref 0.57–1.00)
Globulin, Total: 2.1 g/dL (ref 1.5–4.5)
Glucose: 96 mg/dL (ref 70–99)
Potassium: 4.5 mmol/L (ref 3.5–5.2)
Sodium: 131 mmol/L — ABNORMAL LOW (ref 134–144)
Total Protein: 6.5 g/dL (ref 6.0–8.5)
eGFR: 47 mL/min/{1.73_m2} — ABNORMAL LOW (ref >59)

## 2023-02-20 LAB — CBC WITH DIFF/PLATELET
Basophils Absolute: 0.1 10*3/uL (ref 0.0–0.2)
Basos: 1 %
EOS (ABSOLUTE): 0.2 10*3/uL (ref 0.0–0.4)
Eos: 3 %
Hematocrit: 31.4 % — ABNORMAL LOW (ref 34.0–46.6)
Hemoglobin: 9.8 g/dL — ABNORMAL LOW (ref 11.1–15.9)
Immature Grans (Abs): 0.1 10*3/uL (ref 0.0–0.1)
Immature Granulocytes: 1 %
Lymphocytes Absolute: 2.4 10*3/uL (ref 0.7–3.1)
Lymphs: 27 %
MCH: 29.6 pg (ref 26.6–33.0)
MCHC: 31.2 g/dL — ABNORMAL LOW (ref 31.5–35.7)
MCV: 95 fL (ref 79–97)
Monocytes Absolute: 0.7 10*3/uL (ref 0.1–0.9)
Monocytes: 7 %
Neutrophils Absolute: 5.6 10*3/uL (ref 1.4–7.0)
Neutrophils: 61 %
Platelets: 556 10*3/uL — ABNORMAL HIGH (ref 150–450)
RBC: 3.31 x10E6/uL — ABNORMAL LOW (ref 3.77–5.28)
RDW: 13 % (ref 11.7–15.4)
WBC: 9.1 10*3/uL (ref 3.4–10.8)

## 2023-02-20 LAB — LIPID PANEL
Chol/HDL Ratio: 2.6 ratio (ref 0.0–4.4)
Cholesterol, Total: 103 mg/dL (ref 100–199)
HDL: 39 mg/dL — ABNORMAL LOW (ref >39)
LDL Chol Calc (NIH): 41 mg/dL (ref 0–99)
Triglycerides: 128 mg/dL (ref 0–149)
VLDL Cholesterol Cal: 23 mg/dL (ref 5–40)

## 2023-02-20 LAB — TSH: TSH: 1.17 u[IU]/mL (ref 0.450–4.500)

## 2023-02-20 LAB — HEMOGLOBIN A1C
Est. average glucose Bld gHb Est-mCnc: 120 mg/dL
Hgb A1c MFr Bld: 5.8 % — ABNORMAL HIGH (ref 4.8–5.6)

## 2023-02-22 ENCOUNTER — Encounter: Payer: Self-pay | Admitting: Cardiology

## 2023-02-22 ENCOUNTER — Ambulatory Visit (INDEPENDENT_AMBULATORY_CARE_PROVIDER_SITE_OTHER): Payer: Medicare Other | Admitting: Cardiology

## 2023-02-22 VITALS — BP 142/66 | HR 83 | Ht 59.5 in | Wt 191.0 lb

## 2023-02-22 DIAGNOSIS — E66812 Obesity, class 2: Secondary | ICD-10-CM | POA: Insufficient documentation

## 2023-02-22 DIAGNOSIS — Z6837 Body mass index (BMI) 37.0-37.9, adult: Secondary | ICD-10-CM

## 2023-02-22 DIAGNOSIS — I10 Essential (primary) hypertension: Secondary | ICD-10-CM

## 2023-02-22 DIAGNOSIS — Z6836 Body mass index (BMI) 36.0-36.9, adult: Secondary | ICD-10-CM | POA: Insufficient documentation

## 2023-02-22 DIAGNOSIS — E785 Hyperlipidemia, unspecified: Secondary | ICD-10-CM | POA: Diagnosis not present

## 2023-02-22 DIAGNOSIS — M25561 Pain in right knee: Secondary | ICD-10-CM | POA: Diagnosis not present

## 2023-02-22 DIAGNOSIS — E1165 Type 2 diabetes mellitus with hyperglycemia: Secondary | ICD-10-CM | POA: Diagnosis not present

## 2023-02-22 MED ORDER — PREDNISONE 50 MG PO TABS: ORAL_TABLET | ORAL | 0 refills | Status: DC

## 2023-02-22 NOTE — Progress Notes (Signed)
Established Patient Office Visit  Subjective:  Patient ID: Hayley Knight, female    DOB: [birth year]  Age: 87 y.o. MRN: [id number]  Chief Complaint  Patient presents with   Follow-up    4 week follow up    Patient in office for 4 week follow up, discuss recent lab results. Patient was visiting her daughter at the hospital on 10/17 where she fell while walking down the hall. Ct of the head and xray of right knee showed no acute changes. Patient comes in today complaining of ongoing right knee pain, edema, ecchymosis. Will order a CT of the knee to further evaluate her injury. Patient had right knee replacement over 20 years ago with no issues prior to falling. Patient has a contrast dye allergy, will pre-treat with prednisone and benadryl.     No other concerns at this time.   Past Medical History:  Diagnosis Date   Anxiety 05/09/2018   Arthritis    Aspiration pneumonia of left lower lobe (HCC) 07/04/2022   CAP (community acquired pneumonia) 07/02/2022   Cough 10/21/2021   Diabetes mellitus without complication (HCC)    Type II   Dyspnea 07/09/2021   Fatigue due to exposure 06/15/2022   Hypertension    Hyponatremia 10/21/2021   Sleep apnea    No Cpap   SOB (shortness of breath) 10/21/2021   Viral upper respiratory tract infection 06/15/2022    Past Surgical History:  Procedure Laterality Date   ABDOMINAL HYSTERECTOMY     APPENDECTOMY     CARDIAC CATHETERIZATION     CHOLECYSTECTOMY     DILATION AND CURETTAGE OF UTERUS     MASTECTOMY Right    REPLACEMENT TOTAL KNEE BILATERAL     RIGHT/LEFT HEART CATH AND CORONARY ANGIOGRAPHY Bilateral 07/09/2021   Procedure: RIGHT/LEFT HEART CATH AND CORONARY ANGIOGRAPHY;  Surgeon: Alwyn Pea, MD;  Location: [facility] INVASIVE CV LAB;  Service: Cardiovascular;  Laterality: Bilateral;   TOOTH EXTRACTION N/A 01/02/2022   Procedure: DENTAL RESTORATION/EXTRACTIONS;  Surgeon: Ocie Doyne, DMD;  Location: [facility] OR;  Service: Oral Surgery;   Laterality: N/A;    Social History   Socioeconomic History   Marital status: Widowed    Spouse name: Not on file   Number of children: Not on file   Years of education: Not on file   Highest education level: Not on file  Occupational History   Not on file  Tobacco Use   Smoking status: Never    Passive exposure: Never   Smokeless tobacco: Never  Vaping Use   Vaping status: Never Used  Substance and Sexual Activity   Alcohol use: No   Drug use: No   Sexual activity: Not on file  Other Topics Concern   Not on file  Social History Narrative   Not on file   Social Determinants of Health   Financial Resource Strain: Not on file  Food Insecurity: No Food Insecurity (07/03/2022)   Hunger Vital Sign    Worried About Running Out of Food in the Last Year: Never true    Ran Out of Food in the Last Year: Never true  Transportation Needs: No Transportation Needs (07/03/2022)   PRAPARE - Administrator, Civil Service (Medical): No    Lack of Transportation (Non-Medical): No  Physical Activity: Not on file  Stress: Not on file  Social Connections: Not on file  Intimate Partner Violence: Not At Risk (07/03/2022)   Humiliation, Afraid, Rape, and Kick questionnaire  Fear of Current or Ex-Partner: No    Emotionally Abused: No    Physically Abused: No    Sexually Abused: No    History reviewed. No pertinent family history.  Allergies  Allergen Reactions   Iodine Swelling   Ivp Dye [Iodinated Contrast Media] Swelling   Sulfa Antibiotics Swelling    Review of Systems  Constitutional: Negative.   HENT: Negative.    Eyes: Negative.   Respiratory: Negative.  Negative for shortness of breath.   Cardiovascular: Negative.  Negative for chest pain.  Gastrointestinal: Negative.  Negative for abdominal pain, constipation and diarrhea.  Genitourinary: Negative.   Musculoskeletal:  Positive for joint pain. Negative for myalgias.  Skin: Negative.   Neurological: Negative.   Negative for dizziness and headaches.  Endo/Heme/Allergies: Negative.   All other systems reviewed and are negative.      Objective:   BP (!) 142/66   Pulse 83   Ht 4' 11.5" (1.511 m)   Wt 191 lb (86.6 kg)   SpO2 98%   BMI 37.93 kg/m   Vitals:   02/22/23 1343  BP: (!) 142/66  Pulse: 83  Height: 4' 11.5" (1.511 m)  Weight: 191 lb (86.6 kg)  SpO2: 98%  BMI (Calculated): 37.95    Physical Exam Vitals and nursing note reviewed.  Constitutional:      Appearance: Normal appearance. She is normal weight.  HENT:     Head: Normocephalic and atraumatic.     Nose: Nose normal.     Mouth/Throat:     Mouth: Mucous membranes are moist.  Eyes:     Extraocular Movements: Extraocular movements intact.     Conjunctiva/sclera: Conjunctivae normal.     Pupils: Pupils are equal, round, and reactive to light.  Cardiovascular:     Rate and Rhythm: Normal rate and regular rhythm.     Pulses: Normal pulses.     Heart sounds: Normal heart sounds.  Pulmonary:     Effort: Pulmonary effort is normal.     Breath sounds: Normal breath sounds.  Abdominal:     General: Abdomen is flat. Bowel sounds are normal.     Palpations: Abdomen is soft.  Musculoskeletal:        General: Normal range of motion.     Cervical back: Normal range of motion.     Right knee: Swelling, ecchymosis and bony tenderness present.  Skin:    General: Skin is warm and dry.  Neurological:     General: No focal deficit present.     Mental Status: She is alert and oriented to person, place, and time.  Psychiatric:        Mood and Affect: Mood normal.        Behavior: Behavior normal.        Thought Content: Thought content normal.        Judgment: Judgment normal.      No results found for any visits on 02/22/23.  Recent Results (from the past 2160 hour(s))  CBC With Diff/Platelet     Status: Abnormal   Collection Time: 02/19/23  2:24 PM  Result Value Ref Range   WBC 9.1 3.4 - 10.8 x10E3/uL   RBC 3.31  (L) 3.77 - 5.28 x10E6/uL   Hemoglobin 9.8 (L) 11.1 - 15.9 g/dL   Hematocrit 16.1 (L) 09.6 - 46.6 %   MCV 95 79 - 97 fL   MCH 29.6 26.6 - 33.0 pg   MCHC 31.2 (L) 31.5 - 35.7 g/dL   RDW  13.0 11.7 - 15.4 %   Platelets 556 (H) 150 - 450 x10E3/uL   Neutrophils 61 Not Estab. %   Lymphs 27 Not Estab. %   Monocytes 7 Not Estab. %   Eos 3 Not Estab. %   Basos 1 Not Estab. %   Neutrophils Absolute 5.6 1.4 - 7.0 x10E3/uL   Lymphocytes Absolute 2.4 0.7 - 3.1 x10E3/uL   Monocytes Absolute 0.7 0.1 - 0.9 x10E3/uL   EOS (ABSOLUTE) 0.2 0.0 - 0.4 x10E3/uL   Basophils Absolute 0.1 0.0 - 0.2 x10E3/uL   Immature Granulocytes 1 Not Estab. %   Immature Grans (Abs) 0.1 0.0 - 0.1 x10E3/uL  Lipid Profile     Status: Abnormal   Collection Time: 02/19/23  2:24 PM  Result Value Ref Range   Cholesterol, Total 103 100 - 199 mg/dL   Triglycerides 536 0 - 149 mg/dL   HDL 39 (L) >64 mg/dL   VLDL Cholesterol Cal 23 5 - 40 mg/dL   LDL Chol Calc (NIH) 41 0 - 99 mg/dL   Chol/HDL Ratio 2.6 0.0 - 4.4 ratio    Comment:                                   T. Chol/HDL Ratio                                             Men  Women                               1/2 Avg.Risk  3.4    3.3                                   Avg.Risk  5.0    4.4                                2X Avg.Risk  9.6    7.1                                3X Avg.Risk 23.4   11.0   CMP14+EGFR     Status: Abnormal   Collection Time: 02/19/23  2:24 PM  Result Value Ref Range   Glucose 96 70 - 99 mg/dL   BUN 14 8 - 27 mg/dL   Creatinine, Ser 4.03 (H) 0.57 - 1.00 mg/dL   eGFR 47 (L) >47 QQ/VZD/6.38   BUN/Creatinine Ratio 12 12 - 28   Sodium 131 (L) 134 - 144 mmol/L   Potassium 4.5 3.5 - 5.2 mmol/L   Chloride 87 (L) 96 - 106 mmol/L   CO2 26 20 - 29 mmol/L   Calcium 9.5 8.7 - 10.3 mg/dL   Total Protein 6.5 6.0 - 8.5 g/dL   Albumin 4.4 3.7 - 4.7 g/dL   Globulin, Total 2.1 1.5 - 4.5 g/dL   Bilirubin Total 0.7 0.0 - 1.2 mg/dL   Alkaline Phosphatase 51  44 - 121 IU/L   AST 24 0 - 40 IU/L   ALT 14 0 - 32 IU/L  TSH  Status: None   Collection Time: 02/19/23  2:24 PM  Result Value Ref Range   TSH 1.170 0.450 - 4.500 uIU/mL  Hemoglobin A1c     Status: Abnormal   Collection Time: 02/19/23  2:24 PM  Result Value Ref Range   Hgb A1c MFr Bld 5.8 (H) 4.8 - 5.6 %    Comment:          Prediabetes: 5.7 - 6.4          Diabetes: >6.4          Glycemic control for adults with diabetes: <7.0    Est. average glucose Bld gHb Est-mCnc 120 mg/dL      Assessment & Plan:  CT scan right knee Prednisone and benadryl for pre-treatment for dye allergy.   Problem List Items Addressed This Visit       Cardiovascular and Mediastinum   Benign essential hypertension     Endocrine   Type 2 diabetes mellitus with hyperglycemia (HCC)     Other   Dyslipidemia   Class 2 severe obesity due to excess calories with serious comorbidity and body mass index (BMI) of 37.0 to 37.9 in adult [facility])   Acute pain of right knee - Primary   Relevant Orders   CT EXTREMITY LOWER RIGHT W CONTRAST    Return in about 2 weeks (around 03/08/2023) for after CT scan .   Total time spent: 25 minutes  [employer], NP  02/22/2023   This document may have been prepared by Dragon Voice Recognition software and as such may include unintentional dictation errors.

## 2023-03-01 ENCOUNTER — Ambulatory Visit
Admission: RE | Admit: 2023-03-01 | Discharge: 2023-03-01 | Disposition: A | Discharge status: Home / Self Care | Payer: Medicare Other | Source: Ambulatory Visit | Attending: Cardiology | Admitting: Cardiology

## 2023-03-01 DIAGNOSIS — Z96651 Presence of right artificial knee joint: Secondary | ICD-10-CM | POA: Diagnosis not present

## 2023-03-01 DIAGNOSIS — S8001XA Contusion of right knee, initial encounter: Secondary | ICD-10-CM | POA: Diagnosis not present

## 2023-03-01 DIAGNOSIS — M25561 Pain in right knee: Secondary | ICD-10-CM | POA: Insufficient documentation

## 2023-03-04 ENCOUNTER — Other Ambulatory Visit: Payer: Medicare Other

## 2023-03-04 DIAGNOSIS — I10 Essential (primary) hypertension: Secondary | ICD-10-CM

## 2023-03-04 DIAGNOSIS — E1165 Type 2 diabetes mellitus with hyperglycemia: Secondary | ICD-10-CM

## 2023-03-04 DIAGNOSIS — E669 Obesity, unspecified: Secondary | ICD-10-CM

## 2023-03-08 ENCOUNTER — Encounter: Payer: Self-pay | Admitting: Cardiology

## 2023-03-08 ENCOUNTER — Ambulatory Visit (INDEPENDENT_AMBULATORY_CARE_PROVIDER_SITE_OTHER): Payer: Medicare Other | Admitting: Cardiology

## 2023-03-08 VITALS — BP 106/60 | HR 110 | Ht 59.0 in | Wt 191.0 lb

## 2023-03-08 DIAGNOSIS — Z0000 Encounter for general adult medical examination without abnormal findings: Secondary | ICD-10-CM

## 2023-03-08 DIAGNOSIS — M25561 Pain in right knee: Secondary | ICD-10-CM | POA: Diagnosis not present

## 2023-03-08 MED ORDER — PREDNISONE 5 MG PO TABS: 5.0000 mg | ORAL_TABLET | Freq: Two times a day (BID) | ORAL | 3 refills | Status: DC

## 2023-03-08 NOTE — Progress Notes (Unsigned)
Established Patient Office Visit  Subjective:  Patient ID: Hayley Knight, female    DOB: [birth year]  Age: 87 y.o. MRN: [id number]  Chief Complaint  Patient presents with   Annual Exam    AWV    Patient in office for annual medicare wellness visit. No acute complaints.     No other concerns at this time.   Past Medical History:  Diagnosis Date   Anxiety 05/09/2018   Arthritis    Aspiration pneumonia of left lower lobe (HCC) 07/04/2022   CAP (community acquired pneumonia) 07/02/2022   Cough 10/21/2021   Diabetes mellitus without complication (HCC)    Type II   Dyspnea 07/09/2021   Fatigue due to exposure 06/15/2022   Hypertension    Hyponatremia 10/21/2021   Sleep apnea    No Cpap   SOB (shortness of breath) 10/21/2021   Viral upper respiratory tract infection 06/15/2022    Past Surgical History:  Procedure Laterality Date   ABDOMINAL HYSTERECTOMY     APPENDECTOMY     CARDIAC CATHETERIZATION     CHOLECYSTECTOMY     DILATION AND CURETTAGE OF UTERUS     MASTECTOMY Right    REPLACEMENT TOTAL KNEE BILATERAL     RIGHT/LEFT HEART CATH AND CORONARY ANGIOGRAPHY Bilateral 07/09/2021   Procedure: RIGHT/LEFT HEART CATH AND CORONARY ANGIOGRAPHY;  Surgeon: Alwyn Pea, MD;  Location: [facility];  Service: Cardiovascular;  Laterality: Bilateral;   TOOTH EXTRACTION N/A 01/02/2022   Procedure: DENTAL RESTORATION/EXTRACTIONS;  Surgeon: Ocie Doyne, DMD;  Location: [facility];  Service: Oral Surgery;  Laterality: N/A;    Social History   Socioeconomic History   Marital status: Widowed    Spouse name: Not on file   Number of children: Not on file   Years of education: Not on file   Highest education level: Not on file  Occupational History   Not on file  Tobacco Use   Smoking status: Never    Passive exposure: Never   Smokeless tobacco: Never  Vaping Use   Vaping status: Never Used  Substance and Sexual Activity   Alcohol use: No   Drug use: No    Sexual activity: Not on file  Other Topics Concern   Not on file  Social History Narrative   Not on file   Social Determinants of Health   Financial Resource Strain: Not on file  Food Insecurity: No Food Insecurity (07/03/2022)   Hunger Vital Sign    Worried About Running Out of Food in the Last Year: Never true    Ran Out of Food in the Last Year: Never true  Transportation Needs: No Transportation Needs (07/03/2022)   PRAPARE - Administrator, Civil Service (Medical): No    Lack of Transportation (Non-Medical): No  Physical Activity: Not on file  Stress: Not on file  Social Connections: Not on file  Intimate Partner Violence: Not At Risk (07/03/2022)   Humiliation, Afraid, Rape, and Kick questionnaire    Fear of Current or Ex-Partner: No    Emotionally Abused: No    Physically Abused: No    Sexually Abused: No    History reviewed. No pertinent family history.  Allergies  Allergen Reactions   Iodine Swelling   Ivp Dye [Iodinated Contrast Media] Swelling   Sulfa Antibiotics Swelling    Review of Systems  Constitutional: Negative.   HENT: Negative.    Eyes: Negative.   Respiratory: Negative.  Negative for shortness of breath.  Cardiovascular: Negative.  Negative for chest pain.  Gastrointestinal: Negative.  Negative for abdominal pain, constipation and diarrhea.  Genitourinary: Negative.   Musculoskeletal:  Positive for back pain and joint pain. Negative for myalgias.  Skin: Negative.   Neurological: Negative.  Negative for dizziness and headaches.  Endo/Heme/Allergies: Negative.   All other systems reviewed and are negative.      Objective:   BP 106/60   Pulse (!) 110   Ht 4\' 11"  (1.499 m)   Wt 191 lb (86.6 kg)   SpO2 92%   BMI 38.58 kg/m   Vitals:   03/08/23 1322  BP: 106/60  Pulse: (!) 110  Height: 4\' 11"  (1.499 m)  Weight: 191 lb (86.6 kg)  SpO2: 92%  BMI (Calculated): 38.56    Physical Exam Vitals and nursing note reviewed.   Constitutional:      Appearance: Normal appearance. She is normal weight.  HENT:     Head: Normocephalic and atraumatic.     Nose: Nose normal.     Mouth/Throat:     Mouth: Mucous membranes are moist.  Eyes:     Extraocular Movements: Extraocular movements intact.     Conjunctiva/sclera: Conjunctivae normal.     Pupils: Pupils are equal, round, and reactive to light.  Cardiovascular:     Rate and Rhythm: Normal rate and regular rhythm.     Pulses: Normal pulses.     Heart sounds: Normal heart sounds.  Pulmonary:     Effort: Pulmonary effort is normal.     Breath sounds: Normal breath sounds.  Abdominal:     General: Abdomen is flat. Bowel sounds are normal.     Palpations: Abdomen is soft.  Musculoskeletal:        General: Normal range of motion.     Cervical back: Normal range of motion.  Skin:    General: Skin is warm and dry.  Neurological:     General: No focal deficit present.     Mental Status: She is alert and oriented to person, place, and time.  Psychiatric:        Mood and Affect: Mood normal.        Behavior: Behavior normal.        Thought Content: Thought content normal.        Judgment: Judgment normal.      No results found for any visits on 03/08/23.  Recent Results (from the past 2160 hour(s))  CBC With Diff/Platelet     Status: Abnormal   Collection Time: 02/19/23  2:24 PM  Result Value Ref Range   WBC 9.1 3.4 - 10.8 x10E3/uL   RBC 3.31 (L) 3.77 - 5.28 x10E6/uL   Hemoglobin 9.8 (L) 11.1 - 15.9 g/dL   Hematocrit 40.9 (L) 81.1 - 46.6 %   MCV 95 79 - 97 fL   MCH 29.6 26.6 - 33.0 pg   MCHC 31.2 (L) 31.5 - 35.7 g/dL   RDW 91.4 78.2 - 95.6 %   Platelets 556 (H) 150 - 450 x10E3/uL   Neutrophils 61 Not Estab. %   Lymphs 27 Not Estab. %   Monocytes 7 Not Estab. %   Eos 3 Not Estab. %   Basos 1 Not Estab. %   Neutrophils Absolute 5.6 1.4 - 7.0 x10E3/uL   Lymphocytes Absolute 2.4 0.7 - 3.1 x10E3/uL   Monocytes Absolute 0.7 0.1 - 0.9 x10E3/uL   EOS  (ABSOLUTE) 0.2 0.0 - 0.4 x10E3/uL   Basophils Absolute 0.1 0.0 - 0.2 x10E3/uL  Immature Granulocytes 1 Not Estab. %   Immature Grans (Abs) 0.1 0.0 - 0.1 x10E3/uL  Lipid Profile     Status: Abnormal   Collection Time: 02/19/23  2:24 PM  Result Value Ref Range   Cholesterol, Total 103 100 - 199 mg/dL   Triglycerides 102 0 - 149 mg/dL   HDL 39 (L) >72 mg/dL   VLDL Cholesterol Cal 23 5 - 40 mg/dL   LDL Chol Calc (NIH) 41 0 - 99 mg/dL   Chol/HDL Ratio 2.6 0.0 - 4.4 ratio    Comment:                                   T. Chol/HDL Ratio                                             Men  Women                               1/2 Avg.Risk  3.4    3.3                                   Avg.Risk  5.0    4.4                                2X Avg.Risk  9.6    7.1                                3X Avg.Risk 23.4   11.0   CMP14+EGFR     Status: Abnormal   Collection Time: 02/19/23  2:24 PM  Result Value Ref Range   Glucose 96 70 - 99 mg/dL   BUN 14 8 - 27 mg/dL   Creatinine, Ser 5.36 (H) 0.57 - 1.00 mg/dL   eGFR 47 (L) >64 QI/HKV/4.25   BUN/Creatinine Ratio 12 12 - 28   Sodium 131 (L) 134 - 144 mmol/L   Potassium 4.5 3.5 - 5.2 mmol/L   Chloride 87 (L) 96 - 106 mmol/L   CO2 26 20 - 29 mmol/L   Calcium 9.5 8.7 - 10.3 mg/dL   Total Protein 6.5 6.0 - 8.5 g/dL   Albumin 4.4 3.7 - 4.7 g/dL   Globulin, Total 2.1 1.5 - 4.5 g/dL   Bilirubin Total 0.7 0.0 - 1.2 mg/dL   Alkaline Phosphatase 51 44 - 121 IU/L   AST 24 0 - 40 IU/L   ALT 14 0 - 32 IU/L  TSH     Status: None   Collection Time: 02/19/23  2:24 PM  Result Value Ref Range   TSH 1.170 0.450 - 4.500 uIU/mL  Hemoglobin A1c     Status: Abnormal   Collection Time: 02/19/23  2:24 PM  Result Value Ref Range   Hgb A1c MFr Bld 5.8 (H) 4.8 - 5.6 %    Comment:          Prediabetes: 5.7 - 6.4          Diabetes: >6.4  Glycemic control for adults with diabetes: <7.0    Est. average glucose Bld gHb Est-mCnc 120 mg/dL      Assessment & Plan:    Problem List Items Addressed This Visit       Other   Acute pain of right knee - Primary   Relevant Orders   Ambulatory referral to Orthopedic Surgery    Return in about 3 months (around 06/08/2023).   Total time spent: 30 minutes  Ninnie Fein, NP  03/08/2023   This document may have been prepared by Dragon Voice Recognition software and as such may include unintentional dictation errors.

## 2023-03-09 ENCOUNTER — Telehealth: Payer: Self-pay

## 2023-03-09 NOTE — Telephone Encounter (Signed)
Pt called stating she is need and abx called in because she is sick, we need to call pt and find out symptoms

## 2023-03-10 ENCOUNTER — Telehealth: Payer: Self-pay

## 2023-03-10 NOTE — Telephone Encounter (Signed)
Patient called with head congestion getting worse. Wants antibiotic called in. Please advise.

## 2023-03-11 ENCOUNTER — Other Ambulatory Visit: Payer: Self-pay | Admitting: Cardiology

## 2023-03-11 ENCOUNTER — Ambulatory Visit: Payer: Medicare Other | Admitting: Dermatology

## 2023-03-11 MED ORDER — AZITHROMYCIN 250 MG PO TABS: ORAL_TABLET | ORAL | 0 refills | Status: AC

## 2023-04-05 DIAGNOSIS — M542 Cervicalgia: Secondary | ICD-10-CM | POA: Diagnosis not present

## 2023-04-05 DIAGNOSIS — M545 Low back pain, unspecified: Secondary | ICD-10-CM | POA: Diagnosis not present

## 2023-04-05 DIAGNOSIS — Z96651 Presence of right artificial knee joint: Secondary | ICD-10-CM | POA: Diagnosis not present

## 2023-04-05 DIAGNOSIS — S8001XA Contusion of right knee, initial encounter: Secondary | ICD-10-CM | POA: Diagnosis not present

## 2023-04-05 DIAGNOSIS — M25561 Pain in right knee: Secondary | ICD-10-CM | POA: Diagnosis not present

## 2023-04-05 DIAGNOSIS — G890 Central pain syndrome: Secondary | ICD-10-CM | POA: Diagnosis not present

## 2023-04-07 DIAGNOSIS — Z79891 Long term (current) use of opiate analgesic: Secondary | ICD-10-CM | POA: Diagnosis not present

## 2023-04-14 ENCOUNTER — Other Ambulatory Visit: Payer: Self-pay

## 2023-04-15 MED ORDER — FUROSEMIDE 40 MG PO TABS: 40.0000 mg | ORAL_TABLET | Freq: Every day | ORAL | 1 refills | Status: DC

## 2023-04-20 ENCOUNTER — Other Ambulatory Visit: Payer: Self-pay

## 2023-04-22 ENCOUNTER — Other Ambulatory Visit: Payer: Self-pay | Admitting: Cardiology

## 2023-04-22 ENCOUNTER — Other Ambulatory Visit: Payer: Self-pay

## 2023-04-23 ENCOUNTER — Other Ambulatory Visit: Payer: Self-pay

## 2023-04-23 ENCOUNTER — Other Ambulatory Visit: Payer: Self-pay | Admitting: Internal Medicine

## 2023-04-23 DIAGNOSIS — E1165 Type 2 diabetes mellitus with hyperglycemia: Secondary | ICD-10-CM

## 2023-04-23 MED ORDER — SITAGLIPTIN PHOSPHATE 100 MG PO TABS: 100.0000 mg | ORAL_TABLET | Freq: Every day | ORAL | 3 refills | Status: DC

## 2023-04-28 ENCOUNTER — Other Ambulatory Visit: Payer: Self-pay | Admitting: Cardiology

## 2023-04-30 DIAGNOSIS — Z79891 Long term (current) use of opiate analgesic: Secondary | ICD-10-CM | POA: Diagnosis not present

## 2023-05-15 ENCOUNTER — Other Ambulatory Visit: Payer: Self-pay | Admitting: Cardiology

## 2023-05-15 DIAGNOSIS — K219 Gastro-esophageal reflux disease without esophagitis: Secondary | ICD-10-CM

## 2023-05-31 ENCOUNTER — Other Ambulatory Visit: Payer: Self-pay

## 2023-05-31 DIAGNOSIS — J069 Acute upper respiratory infection, unspecified: Secondary | ICD-10-CM

## 2023-05-31 MED ORDER — OMEGA-3-ACID ETHYL ESTERS 1 G PO CAPS: 2.0000 g | ORAL_CAPSULE | Freq: Two times a day (BID) | ORAL | 1 refills | Status: DC

## 2023-05-31 MED ORDER — FENOFIBRATE 145 MG PO TABS: 145.0000 mg | ORAL_TABLET | Freq: Every day | ORAL | 1 refills | Status: DC

## 2023-05-31 MED ORDER — ALBUTEROL SULFATE HFA 108 (90 BASE) MCG/ACT IN AERS: 2.0000 | INHALATION_SPRAY | Freq: Four times a day (QID) | RESPIRATORY_TRACT | 2 refills | Status: DC | PRN

## 2023-06-07 ENCOUNTER — Encounter: Payer: Self-pay | Admitting: Cardiology

## 2023-06-07 ENCOUNTER — Ambulatory Visit (INDEPENDENT_AMBULATORY_CARE_PROVIDER_SITE_OTHER): Payer: Medicare Other | Admitting: Cardiology

## 2023-06-07 VITALS — BP 120/62 | HR 81 | Ht 59.0 in | Wt 190.0 lb

## 2023-06-07 DIAGNOSIS — E785 Hyperlipidemia, unspecified: Secondary | ICD-10-CM

## 2023-06-07 DIAGNOSIS — E1165 Type 2 diabetes mellitus with hyperglycemia: Secondary | ICD-10-CM | POA: Diagnosis not present

## 2023-06-07 DIAGNOSIS — G890 Central pain syndrome: Secondary | ICD-10-CM | POA: Diagnosis not present

## 2023-06-07 DIAGNOSIS — I10 Essential (primary) hypertension: Secondary | ICD-10-CM

## 2023-06-07 DIAGNOSIS — Z1329 Encounter for screening for other suspected endocrine disorder: Secondary | ICD-10-CM | POA: Diagnosis not present

## 2023-06-07 DIAGNOSIS — M542 Cervicalgia: Secondary | ICD-10-CM | POA: Diagnosis not present

## 2023-06-07 MED ORDER — LEVOCETIRIZINE DIHYDROCHLORIDE 5 MG PO TABS: 5.0000 mg | ORAL_TABLET | Freq: Every evening | ORAL | 5 refills | Status: DC

## 2023-06-07 NOTE — Progress Notes (Signed)
 Established Patient Office Visit  Subjective:  Patient ID: Hayley Knight, female    DOB: 11/11/1935  Age: 88 y.o. MRN: [id number]  Chief Complaint  Patient presents with   Follow-up    3 Months Follow Up    Patient in office for 3 month follow up. Patient fasting today for lab work. Reports feeling well over all.  Patient requesting a medication for weight loss. Will get fasting lab work today to determine options.     No other concerns at this time.   Past Medical History:  Diagnosis Date   Anxiety 05/09/2018   Arthritis    Aspiration pneumonia of left lower lobe (HCC) 07/04/2022   CAP (community acquired pneumonia) 07/02/2022   Cough 10/21/2021   Diabetes mellitus without complication (HCC)    Type II   Dyspnea 07/09/2021   Fatigue due to exposure 06/15/2022   Hypertension    Hyponatremia 10/21/2021   Sleep apnea    No Cpap   SOB (shortness of breath) 10/21/2021   Viral upper respiratory tract infection 06/15/2022    Past Surgical History:  Procedure Laterality Date   ABDOMINAL HYSTERECTOMY     APPENDECTOMY     CARDIAC CATHETERIZATION     CHOLECYSTECTOMY     DILATION AND CURETTAGE OF UTERUS     MASTECTOMY Right    REPLACEMENT TOTAL KNEE BILATERAL     RIGHT/LEFT HEART CATH AND CORONARY ANGIOGRAPHY Bilateral 07/09/2021   Procedure: RIGHT/LEFT HEART CATH AND CORONARY ANGIOGRAPHY;  Surgeon: Antonette Batters, MD;  Location: [facility];  Service: Cardiovascular;  Laterality: Bilateral;   TOOTH EXTRACTION N/A 01/02/2022   Procedure: DENTAL RESTORATION/EXTRACTIONS;  Surgeon: Ascencion Lava, DMD;  Location: [facility];  Service: Oral Surgery;  Laterality: N/A;    Social History   Socioeconomic History   Marital status: Widowed    Spouse name: Not on file   Number of children: Not on file   Years of education: Not on file   Highest education level: Not on file  Occupational History   Not on file  Tobacco Use   Smoking status: Never    Passive exposure:  Never   Smokeless tobacco: Never  Vaping Use   Vaping status: Never Used  Substance and Sexual Activity   Alcohol use: No   Drug use: No   Sexual activity: Not on file  Other Topics Concern   Not on file  Social History Narrative   Not on file   Social Drivers of Health   Financial Resource Strain: Not on file  Food Insecurity: No Food Insecurity (07/03/2022)   Hunger Vital Sign    Worried About Running Out of Food in the Last Year: Never true    Ran Out of Food in the Last Year: Never true  Transportation Needs: No Transportation Needs (07/03/2022)   PRAPARE - Administrator, Civil Service (Medical): No    Lack of Transportation (Non-Medical): No  Physical Activity: Not on file  Stress: Not on file  Social Connections: Not on file  Intimate Partner Violence: Not At Risk (07/03/2022)   Humiliation, Afraid, Rape, and Kick questionnaire    Fear of Current or Ex-Partner: No    Emotionally Abused: No    Physically Abused: No    Sexually Abused: No    History reviewed. No pertinent family history.  Allergies  Allergen Reactions   Iodine Swelling   Ivp Dye [Iodinated Contrast Media] Swelling   Sulfa Antibiotics Swelling  Outpatient Medications Prior to Visit  Medication Sig   albuterol  (VENTOLIN  HFA) 108 (90 Base) MCG/ACT inhaler Inhale 2 puffs into the lungs every 6 (six) hours as needed for wheezing or shortness of breath.   amLODipine  (NORVASC ) 2.5 MG tablet TAKE 1 TABLET BY MOUTH DAILY FOR ELEVATED BLOOD PRESSURE   aspirin  81 MG EC tablet Take 81 mg by mouth at bedtime.   Cholecalciferol  (VITAMIN D3) 125 MCG (5000 UT) CAPS Take 1 capsule (5,000 Units total) by mouth daily.   clopidogrel  (PLAVIX ) 75 MG tablet TAKE 1 TABLET BY MOUTH EVERY DAY   diazepam  (VALIUM ) 5 MG tablet Take 5 mg by mouth every 12 (twelve) hours as needed for anxiety.   escitalopram  (LEXAPRO ) 20 MG tablet TAKE 1 TABLET BY MOUTH EVERY DAY   fenofibrate  (TRICOR ) 145 MG tablet Take 1 tablet  (145 mg total) by mouth at bedtime.   furosemide  (LASIX ) 40 MG tablet Take 1 tablet (40 mg total) by mouth daily.   gabapentin  (NEURONTIN ) 300 MG capsule Take 300 mg by mouth 2 (two) times daily as needed (Neck pain).   hydrochlorothiazide  (HYDRODIURIL ) 12.5 MG tablet Take 1 tablet (12.5 mg total) by mouth daily.   losartan  (COZAAR ) 100 MG tablet Take 1 tablet (100 mg total) by mouth daily.   Melatonin 10 MG TABS Take 10 mg by mouth at bedtime.   Multiple Vitamin (MULTIVITAMIN) capsule Take 2 capsules by mouth daily.   nitroGLYCERIN  (NITROSTAT ) 0.4 MG SL tablet Place 0.4 mg under the tongue every 5 (five) minutes as needed for chest pain.   omega-3 acid ethyl esters (LOVAZA ) 1 g capsule Take 2 capsules (2 g total) by mouth 2 (two) times daily.   omeprazole  (PRILOSEC) 40 MG capsule TAKE 1 CAPSULE (40 MG TOTAL) BY MOUTH DAILY.   oxyCODONE  (OXYCONTIN ) 10 mg 12 hr tablet Take 20 mg by mouth every 12 (twelve) hours.   potassium chloride  (KLOR-CON ) 10 MEQ tablet Take 1 tablet (10 mEq total) by mouth daily.   predniSONE  (DELTASONE ) 5 MG tablet Take 1 tablet (5 mg total) by mouth 2 (two) times daily with a meal.   sitaGLIPtin  (JANUVIA ) 100 MG tablet Take 1 tablet (100 mg total) by mouth daily.   [DISCONTINUED] levocetirizine (XYZAL  ALLERGY 24HR) 5 MG tablet Take 1 tablet (5 mg total) by mouth every evening.   No facility-administered medications prior to visit.    Review of Systems  Constitutional: Negative.   HENT: Negative.    Eyes: Negative.   Respiratory: Negative.  Negative for shortness of breath.   Cardiovascular: Negative.  Negative for chest pain.  Gastrointestinal: Negative.  Negative for abdominal pain, constipation and diarrhea.  Genitourinary: Negative.   Musculoskeletal:  Negative for joint pain and myalgias.  Skin: Negative.   Neurological: Negative.  Negative for dizziness and headaches.  Endo/Heme/Allergies: Negative.   All other systems reviewed and are negative.       Objective:   BP 120/62   Pulse 81   Ht 4\' 11"  (1.499 m)   Wt 190 lb (86.2 kg)   SpO2 94%   BMI 38.38 kg/m   Vitals:   06/07/23 1409  BP: 120/62  Pulse: 81  Height: 4\' 11"  (1.499 m)  Weight: 190 lb (86.2 kg)  SpO2: 94%  BMI (Calculated): 38.35    Physical Exam Vitals and nursing note reviewed.  Constitutional:      Appearance: Normal appearance. She is normal weight.  HENT:     Head: Normocephalic and atraumatic.  Nose: Nose normal.     Mouth/Throat:     Mouth: Mucous membranes are moist.  Eyes:     Extraocular Movements: Extraocular movements intact.     Conjunctiva/sclera: Conjunctivae normal.     Pupils: Pupils are equal, round, and reactive to light.  Cardiovascular:     Rate and Rhythm: Normal rate and regular rhythm.     Pulses: Normal pulses.     Heart sounds: Normal heart sounds.  Pulmonary:     Effort: Pulmonary effort is normal.     Breath sounds: Normal breath sounds.  Abdominal:     General: Abdomen is flat. Bowel sounds are normal.     Palpations: Abdomen is soft.  Musculoskeletal:        General: Normal range of motion.     Cervical back: Normal range of motion.  Skin:    General: Skin is warm and dry.  Neurological:     General: No focal deficit present.     Mental Status: She is alert and oriented to person, place, and time.  Psychiatric:        Mood and Affect: Mood normal.        Behavior: Behavior normal.        Thought Content: Thought content normal.        Judgment: Judgment normal.      No results found for any visits on 06/07/23.  No results found for this or any previous visit (from the past 2160 hours).    Assessment & Plan:  Fasting lab work today. GLP1 injectable pending lab results.   Problem List Items Addressed This Visit       Cardiovascular and Mediastinum   Benign essential hypertension - Primary   Relevant Orders   CMP14+EGFR     Endocrine   Type 2 diabetes mellitus with hyperglycemia (HCC)    Relevant Orders   Hemoglobin A1c   CMP14+EGFR     Other   Dyslipidemia   Relevant Orders   Lipid Profile   Other Visit Diagnoses       Thyroid  disorder screening       Relevant Orders   TSH       Return in about 4 months (around 10/05/2023) for with fasting labs prior.   Total time spent: 25 minutes  [employer], NP  06/07/2023   This document may have been prepared by Dragon Voice Recognition software and as such may include unintentional dictation errors.

## 2023-06-08 LAB — TSH: TSH: 2.27 u[IU]/mL (ref 0.450–4.500)

## 2023-06-08 LAB — CMP14+EGFR
ALT: 13 [IU]/L (ref 0–32)
AST: 22 [IU]/L (ref 0–40)
Albumin: 4.3 g/dL (ref 3.7–4.7)
Alkaline Phosphatase: 46 [IU]/L (ref 44–121)
BUN/Creatinine Ratio: 16 (ref 12–28)
BUN: 19 mg/dL (ref 8–27)
Bilirubin Total: 0.5 mg/dL (ref 0.0–1.2)
CO2: 27 mmol/L (ref 20–29)
Calcium: 9.4 mg/dL (ref 8.7–10.3)
Chloride: 87 mmol/L — ABNORMAL LOW (ref 96–106)
Creatinine, Ser: 1.17 mg/dL — ABNORMAL HIGH (ref 0.57–1.00)
Globulin, Total: 2.2 g/dL (ref 1.5–4.5)
Glucose: 98 mg/dL (ref 70–99)
Potassium: 3.9 mmol/L (ref 3.5–5.2)
Sodium: 130 mmol/L — ABNORMAL LOW (ref 134–144)
Total Protein: 6.5 g/dL (ref 6.0–8.5)
eGFR: 45 mL/min/{1.73_m2} — ABNORMAL LOW (ref >59)

## 2023-06-08 LAB — HEMOGLOBIN A1C
Est. average glucose Bld gHb Est-mCnc: 123 mg/dL
Hgb A1c MFr Bld: 5.9 % — ABNORMAL HIGH (ref 4.8–5.6)

## 2023-06-08 LAB — LIPID PANEL
Chol/HDL Ratio: 3.4 {ratio} (ref 0.0–4.4)
Cholesterol, Total: 112 mg/dL (ref 100–199)
HDL: 33 mg/dL — ABNORMAL LOW (ref >39)
LDL Chol Calc (NIH): 55 mg/dL (ref 0–99)
Triglycerides: 135 mg/dL (ref 0–149)
VLDL Cholesterol Cal: 24 mg/dL (ref 5–40)

## 2023-06-09 ENCOUNTER — Other Ambulatory Visit: Payer: Self-pay | Admitting: Family

## 2023-06-09 ENCOUNTER — Other Ambulatory Visit: Payer: Self-pay | Admitting: Cardiology

## 2023-06-09 DIAGNOSIS — I10 Essential (primary) hypertension: Secondary | ICD-10-CM

## 2023-06-14 ENCOUNTER — Ambulatory Visit: Payer: Medicare Other | Admitting: Dermatology

## 2023-06-22 DIAGNOSIS — K219 Gastro-esophageal reflux disease without esophagitis: Secondary | ICD-10-CM | POA: Diagnosis not present

## 2023-06-22 DIAGNOSIS — E119 Type 2 diabetes mellitus without complications: Secondary | ICD-10-CM | POA: Diagnosis not present

## 2023-06-22 DIAGNOSIS — Z955 Presence of coronary angioplasty implant and graft: Secondary | ICD-10-CM | POA: Diagnosis not present

## 2023-06-22 DIAGNOSIS — I214 Non-ST elevation (NSTEMI) myocardial infarction: Secondary | ICD-10-CM | POA: Diagnosis not present

## 2023-06-22 DIAGNOSIS — N183 Chronic kidney disease, stage 3 unspecified: Secondary | ICD-10-CM | POA: Diagnosis not present

## 2023-06-22 DIAGNOSIS — I2089 Other forms of angina pectoris: Secondary | ICD-10-CM | POA: Diagnosis not present

## 2023-06-22 DIAGNOSIS — N1831 Chronic kidney disease, stage 3a: Secondary | ICD-10-CM | POA: Diagnosis not present

## 2023-06-22 DIAGNOSIS — R0602 Shortness of breath: Secondary | ICD-10-CM | POA: Diagnosis not present

## 2023-06-22 DIAGNOSIS — I25110 Atherosclerotic heart disease of native coronary artery with unstable angina pectoris: Secondary | ICD-10-CM | POA: Diagnosis not present

## 2023-06-23 ENCOUNTER — Other Ambulatory Visit: Payer: Self-pay | Admitting: Cardiology

## 2023-07-14 DIAGNOSIS — Z79891 Long term (current) use of opiate analgesic: Secondary | ICD-10-CM | POA: Diagnosis not present

## 2023-08-02 ENCOUNTER — Encounter: Payer: Self-pay | Admitting: Cardiology

## 2023-08-02 ENCOUNTER — Ambulatory Visit (INDEPENDENT_AMBULATORY_CARE_PROVIDER_SITE_OTHER): Admitting: Cardiology

## 2023-08-02 VITALS — BP 108/60 | HR 72 | Ht 60.0 in | Wt 188.0 lb

## 2023-08-02 DIAGNOSIS — Z0130 Encounter for examination of blood pressure without abnormal findings: Secondary | ICD-10-CM

## 2023-08-02 DIAGNOSIS — E1165 Type 2 diabetes mellitus with hyperglycemia: Secondary | ICD-10-CM

## 2023-08-02 DIAGNOSIS — J069 Acute upper respiratory infection, unspecified: Secondary | ICD-10-CM | POA: Diagnosis not present

## 2023-08-02 MED ORDER — AMOXICILLIN-POT CLAVULANATE 875-125 MG PO TABS
1.0000 | ORAL_TABLET | Freq: Two times a day (BID) | ORAL | 0 refills | Status: AC
Start: 2023-08-02 — End: 2023-08-09

## 2023-08-02 MED ORDER — MOUNJARO 2.5 MG/0.5ML ~~LOC~~ SOAJ: 2.5000 mg | SUBCUTANEOUS | 3 refills | Status: DC

## 2023-08-02 MED ORDER — FLUTICASONE PROPIONATE 50 MCG/ACT NA SUSP: 1.0000 | Freq: Every day | NASAL | 2 refills | Status: AC

## 2023-08-02 MED ORDER — LEVOCETIRIZINE DIHYDROCHLORIDE 5 MG PO TABS: 5.0000 mg | ORAL_TABLET | Freq: Every evening | ORAL | 5 refills | Status: DC

## 2023-08-02 NOTE — Progress Notes (Signed)
 Established Patient Office Visit  Subjective:  Patient ID: Hayley Knight, female    DOB: 1936-04-18  Age: 88 y.o. MRN: [id number]  Chief Complaint  Patient presents with   Acute Visit    Sore Throat    Patient in office for an acute visit, complaining of a sore throat, noticed a swollen lymph node yesterday on left side of face. Also complaining of congestion, and cough, diarrhea. Will send in Xyzal, Augmentin, and Flonase. Patient also requesting a Glp1 for weight loss. Patient diabetic, will send in [place]. Patient understands to stop Januvia when she starts Mounjaro.   Sore Throat  This is a new problem. The current episode started in the past 7 days. The problem has been unchanged. There has been no fever. Associated symptoms include congestion, coughing, diarrhea and swollen glands. Pertinent negatives include no abdominal pain, headaches or shortness of breath.    No other concerns at this time.   Past Medical History:  Diagnosis Date   Anxiety 05/09/2018   Arthritis    Aspiration pneumonia of left lower lobe (HCC) 07/04/2022   CAP (community acquired pneumonia) 07/02/2022   Cough 10/21/2021   Diabetes mellitus without complication (HCC)    Type II   Dyspnea 07/09/2021   Fatigue due to exposure 06/15/2022   Hypertension    Hyponatremia 10/21/2021   Sleep apnea    No Cpap   SOB (shortness of breath) 10/21/2021   Viral upper respiratory tract infection 06/15/2022    Past Surgical History:  Procedure Laterality Date   ABDOMINAL HYSTERECTOMY     APPENDECTOMY     CARDIAC CATHETERIZATION     CHOLECYSTECTOMY     DILATION AND CURETTAGE OF UTERUS     MASTECTOMY Right    REPLACEMENT TOTAL KNEE BILATERAL     RIGHT/LEFT HEART CATH AND CORONARY ANGIOGRAPHY Bilateral 07/09/2021   Procedure: RIGHT/LEFT HEART CATH AND CORONARY ANGIOGRAPHY;  Surgeon: Alwyn Pea, MD;  Location: [facility];  Service: Cardiovascular;  Laterality: Bilateral;   TOOTH  EXTRACTION N/A 01/02/2022   Procedure: DENTAL RESTORATION/EXTRACTIONS;  Surgeon: Ocie Doyne, DMD;  Location: [facility] OR;  Service: Oral Surgery;  Laterality: N/A;    Social History   Socioeconomic History   Marital status: Widowed    Spouse name: Not on file   Number of children: Not on file   Years of education: Not on file   Highest education level: Not on file  Occupational History   Not on file  Tobacco Use   Smoking status: Never    Passive exposure: Never   Smokeless tobacco: Never  Vaping Use   Vaping status: Never Used  Substance and Sexual Activity   Alcohol use: No   Drug use: No   Sexual activity: Not on file  Other Topics Concern   Not on file  Social History Narrative   Not on file   Social Drivers of Health   Financial Resource Strain: Not on file  Food Insecurity: No Food Insecurity (07/03/2022)   Hunger Vital Sign    Worried About Running Out of Food in the Last Year: Never true    Ran Out of Food in the Last Year: Never true  Transportation Needs: No Transportation Needs (07/03/2022)   PRAPARE - Administrator, Civil Service (Medical): No    Lack of Transportation (Non-Medical): No  Physical Activity: Not on file  Stress: Not on file  Social Connections: Not on file  Intimate Partner Violence: Not At  Risk (07/03/2022)   Humiliation, Afraid, Rape, and Kick questionnaire    Fear of Current or Ex-Partner: No    Emotionally Abused: No    Physically Abused: No    Sexually Abused: No    History reviewed. No pertinent family history.  Allergies  Allergen Reactions   Iodine Swelling   Ivp Dye [Iodinated Contrast Media] Swelling   Sulfa Antibiotics Swelling    Outpatient Medications Prior to Visit  Medication Sig   [DISCONTINUED] amoxicillin-clavulanate (AUGMENTIN) 875-125 MG tablet Take 1 tablet by mouth 2 (two) times daily.   albuterol (VENTOLIN HFA) 108 (90 Base) MCG/ACT inhaler Inhale 2 puffs into the lungs every 6 (six) hours as needed for  wheezing or shortness of breath.   amLODipine (NORVASC) 2.5 MG tablet TAKE 1 TABLET BY MOUTH DAILY FOR ELEVATED BLOOD PRESSURE   aspirin 81 MG EC tablet Take 81 mg by mouth at bedtime.   Cholecalciferol (VITAMIN D3) 125 MCG (5000 UT) CAPS Take 1 capsule (5,000 Units total) by mouth daily.   clopidogrel (PLAVIX) 75 MG tablet TAKE 1 TABLET BY MOUTH EVERY DAY   diazepam (VALIUM) 5 MG tablet Take 5 mg by mouth every 12 (twelve) hours as needed for anxiety.   escitalopram (LEXAPRO) 20 MG tablet TAKE 1 TABLET BY MOUTH EVERY DAY   fenofibrate (TRICOR) 145 MG tablet Take 1 tablet (145 mg total) by mouth at bedtime.   furosemide (LASIX) 40 MG tablet Take 1 tablet (40 mg total) by mouth daily.   gabapentin (NEURONTIN) 300 MG capsule Take 300 mg by mouth 2 (two) times daily as needed (Neck pain).   hydrochlorothiazide (HYDRODIURIL) 12.5 MG tablet TAKE 1 TABLET BY MOUTH EVERY DAY   losartan (COZAAR) 100 MG tablet TAKE 1 TABLET BY MOUTH EVERY DAY   Melatonin 10 MG TABS Take 10 mg by mouth at bedtime.   Multiple Vitamin (MULTIVITAMIN) capsule Take 2 capsules by mouth daily.   nitroGLYCERIN (NITROSTAT) 0.4 MG SL tablet Place 0.4 mg under the tongue every 5 (five) minutes as needed for chest pain.   omega-3 acid ethyl esters (LOVAZA) 1 g capsule TAKE 2 CAPSULES BY MOUTH 2 TIMES DAILY.   omeprazole (PRILOSEC) 40 MG capsule TAKE 1 CAPSULE (40 MG TOTAL) BY MOUTH DAILY.   oxyCODONE (OXYCONTIN) 10 mg 12 hr tablet Take 20 mg by mouth every 12 (twelve) hours.   potassium chloride (KLOR-CON) 10 MEQ tablet Take 1 tablet (10 mEq total) by mouth daily.   predniSONE (DELTASONE) 5 MG tablet Take 1 tablet (5 mg total) by mouth 2 (two) times daily with a meal.   sitaGLIPtin (JANUVIA) 100 MG tablet Take 1 tablet (100 mg total) by mouth daily.   [DISCONTINUED] levocetirizine (XYZAL ALLERGY 24HR) 5 MG tablet Take 1 tablet (5 mg total) by mouth every evening.   No facility-administered medications prior to visit.    Review  of Systems  Constitutional: Negative.   HENT:  Positive for congestion and sinus pain.   Eyes: Negative.   Respiratory:  Positive for cough. Negative for shortness of breath.   Cardiovascular: Negative.  Negative for chest pain.  Gastrointestinal:  Positive for diarrhea. Negative for abdominal pain and constipation.  Genitourinary: Negative.   Musculoskeletal:  Negative for joint pain and myalgias.  Skin: Negative.   Neurological: Negative.  Negative for dizziness and headaches.  Endo/Heme/Allergies: Negative.   All other systems reviewed and are negative.      Objective:   BP 108/60   Pulse 72   Ht 5' (  1.524 m)   Wt 188 lb (85.3 kg)   SpO2 95%   BMI 36.72 kg/m   Vitals:   08/02/23 1356  BP: 108/60  Pulse: 72  Height: 5' (1.524 m)  Weight: 188 lb (85.3 kg)  SpO2: 95%  BMI (Calculated): 36.72    Physical Exam Vitals and nursing note reviewed.  Constitutional:      Appearance: Normal appearance. She is normal weight.  HENT:     Head: Normocephalic and atraumatic.     Nose: Nose normal.     Mouth/Throat:     Mouth: Mucous membranes are moist.  Eyes:     Extraocular Movements: Extraocular movements intact.     Conjunctiva/sclera: Conjunctivae normal.     Pupils: Pupils are equal, round, and reactive to light.  Cardiovascular:     Rate and Rhythm: Normal rate and regular rhythm.     Pulses: Normal pulses.     Heart sounds: Normal heart sounds.  Pulmonary:     Effort: Pulmonary effort is normal.     Breath sounds: Normal breath sounds.  Abdominal:     General: Abdomen is flat. Bowel sounds are normal.     Palpations: Abdomen is soft.  Musculoskeletal:        General: Normal range of motion.     Cervical back: Normal range of motion.  Skin:    General: Skin is warm and dry.  Neurological:     General: No focal deficit present.     Mental Status: She is alert and oriented to person, place, and time.  Psychiatric:        Mood and Affect: Mood normal.         Behavior: Behavior normal.        Thought Content: Thought content normal.        Judgment: Judgment normal.      No results found for any visits on 08/02/23.  Recent Results (from the past 2160 hours)  Lipid Profile     Status: Abnormal   Collection Time: 06/07/23  2:23 PM  Result Value Ref Range   Cholesterol, Total 112 100 - 199 mg/dL   Triglycerides 161 0 - 149 mg/dL   HDL 33 (L) >09 mg/dL   VLDL Cholesterol Cal 24 5 - 40 mg/dL   LDL Chol Calc (NIH) 55 0 - 99 mg/dL   Chol/HDL Ratio 3.4 0.0 - 4.4 ratio    Comment:                                   T. Chol/HDL Ratio                                             Men  Women                               1/2 Avg.Risk  3.4    3.3                                   Avg.Risk  5.0    4.4  2X Avg.Risk  9.6    7.1                                3X Avg.Risk 23.4   11.0   Hemoglobin A1c     Status: Abnormal   Collection Time: 06/07/23  2:23 PM  Result Value Ref Range   Hgb A1c MFr Bld 5.9 (H) 4.8 - 5.6 %    Comment:          Prediabetes: 5.7 - 6.4          Diabetes: >6.4          Glycemic control for adults with diabetes: <7.0    Est. average glucose Bld gHb Est-mCnc 123 mg/dL  [id number]     Status: Abnormal   Collection Time: 06/07/23  2:23 PM  Result Value Ref Range   Glucose 98 70 - 99 mg/dL   BUN 19 8 - 27 mg/dL   Creatinine, Ser 5.28 (H) 0.57 - 1.00 mg/dL   eGFR 45 (L) >41 LK/GMW/1.02   BUN/Creatinine Ratio 16 12 - 28   Sodium 130 (L) 134 - 144 mmol/L   Potassium 3.9 3.5 - 5.2 mmol/L   Chloride 87 (L) 96 - 106 mmol/L   CO2 27 20 - 29 mmol/L   Calcium 9.4 8.7 - 10.3 mg/dL   Total Protein 6.5 6.0 - 8.5 g/dL   Albumin 4.3 3.7 - 4.7 g/dL   Globulin, Total 2.2 1.5 - 4.5 g/dL   Bilirubin Total 0.5 0.0 - 1.2 mg/dL   Alkaline Phosphatase 46 44 - 121 IU/L   AST 22 0 - 40 IU/L   ALT 13 0 - 32 IU/L  TSH     Status: None   Collection Time: 06/07/23  2:23 PM  Result Value Ref Range   TSH 2.270  0.450 - 4.500 uIU/mL      Assessment & Plan:  Augmentin Xyzal Floanse Push fluids Mounjaro for weight loss and DM  Problem List Items Addressed This Visit       Respiratory   Acute URI - Primary     Endocrine   Type 2 diabetes mellitus with hyperglycemia (HCC)   Relevant Medications   tirzepatide (MOUNJARO) 2.5 MG/0.5ML Pen    Return if symptoms worsen or fail to improve, for as schedulec.   Total time spent: 25 minutes  [employer], NP  08/02/2023   This document may have been prepared by Dragon Voice Recognition software and as such may include unintentional dictation errors.

## 2023-08-12 ENCOUNTER — Other Ambulatory Visit: Payer: Self-pay | Admitting: Cardiology

## 2023-08-13 ENCOUNTER — Inpatient Hospital Stay
Admission: EM | Admit: 2023-08-13 | Discharge: 2023-08-16 | DRG: 394 | Disposition: A | Discharge status: Home Health | Attending: Internal Medicine | Admitting: Internal Medicine

## 2023-08-13 ENCOUNTER — Other Ambulatory Visit: Payer: Self-pay

## 2023-08-13 DIAGNOSIS — Z882 Allergy status to sulfonamides status: Secondary | ICD-10-CM | POA: Diagnosis not present

## 2023-08-13 DIAGNOSIS — Z7902 Long term (current) use of antithrombotics/antiplatelets: Secondary | ICD-10-CM

## 2023-08-13 DIAGNOSIS — M1990 Unspecified osteoarthritis, unspecified site: Secondary | ICD-10-CM | POA: Diagnosis not present

## 2023-08-13 DIAGNOSIS — Z90710 Acquired absence of both cervix and uterus: Secondary | ICD-10-CM

## 2023-08-13 DIAGNOSIS — E860 Dehydration: Secondary | ICD-10-CM | POA: Diagnosis present

## 2023-08-13 DIAGNOSIS — I129 Hypertensive chronic kidney disease with stage 1 through stage 4 chronic kidney disease, or unspecified chronic kidney disease: Secondary | ICD-10-CM | POA: Diagnosis present

## 2023-08-13 DIAGNOSIS — E1122 Type 2 diabetes mellitus with diabetic chronic kidney disease: Secondary | ICD-10-CM | POA: Diagnosis present

## 2023-08-13 DIAGNOSIS — Z7984 Long term (current) use of oral hypoglycemic drugs: Secondary | ICD-10-CM | POA: Diagnosis not present

## 2023-08-13 DIAGNOSIS — T383X5A Adverse effect of insulin and oral hypoglycemic [antidiabetic] drugs, initial encounter: Secondary | ICD-10-CM | POA: Diagnosis present

## 2023-08-13 DIAGNOSIS — I10 Essential (primary) hypertension: Secondary | ICD-10-CM | POA: Diagnosis present

## 2023-08-13 DIAGNOSIS — Z794 Long term (current) use of insulin: Secondary | ICD-10-CM | POA: Diagnosis not present

## 2023-08-13 DIAGNOSIS — N182 Chronic kidney disease, stage 2 (mild): Secondary | ICD-10-CM | POA: Diagnosis present

## 2023-08-13 DIAGNOSIS — I2510 Atherosclerotic heart disease of native coronary artery without angina pectoris: Secondary | ICD-10-CM | POA: Diagnosis not present

## 2023-08-13 DIAGNOSIS — R6889 Other general symptoms and signs: Secondary | ICD-10-CM | POA: Diagnosis not present

## 2023-08-13 DIAGNOSIS — E876 Hypokalemia: Secondary | ICD-10-CM | POA: Diagnosis not present

## 2023-08-13 DIAGNOSIS — E871 Hypo-osmolality and hyponatremia: Secondary | ICD-10-CM | POA: Diagnosis not present

## 2023-08-13 DIAGNOSIS — D75838 Other thrombocytosis: Secondary | ICD-10-CM | POA: Diagnosis present

## 2023-08-13 DIAGNOSIS — E66812 Obesity, class 2: Secondary | ICD-10-CM | POA: Diagnosis present

## 2023-08-13 DIAGNOSIS — Z6836 Body mass index (BMI) 36.0-36.9, adult: Secondary | ICD-10-CM | POA: Diagnosis present

## 2023-08-13 DIAGNOSIS — R197 Diarrhea, unspecified: Secondary | ICD-10-CM | POA: Diagnosis not present

## 2023-08-13 DIAGNOSIS — Z6838 Body mass index (BMI) 38.0-38.9, adult: Secondary | ICD-10-CM

## 2023-08-13 DIAGNOSIS — Z91041 Radiographic dye allergy status: Secondary | ICD-10-CM | POA: Diagnosis not present

## 2023-08-13 DIAGNOSIS — Z79899 Other long term (current) drug therapy: Secondary | ICD-10-CM | POA: Diagnosis not present

## 2023-08-13 DIAGNOSIS — Z7952 Long term (current) use of systemic steroids: Secondary | ICD-10-CM

## 2023-08-13 DIAGNOSIS — K521 Toxic gastroenteritis and colitis: Secondary | ICD-10-CM | POA: Diagnosis not present

## 2023-08-13 DIAGNOSIS — R631 Polydipsia: Secondary | ICD-10-CM | POA: Diagnosis not present

## 2023-08-13 DIAGNOSIS — M542 Cervicalgia: Secondary | ICD-10-CM | POA: Diagnosis not present

## 2023-08-13 DIAGNOSIS — Z7982 Long term (current) use of aspirin: Secondary | ICD-10-CM

## 2023-08-13 DIAGNOSIS — Z7985 Long-term (current) use of injectable non-insulin antidiabetic drugs: Secondary | ICD-10-CM | POA: Diagnosis not present

## 2023-08-13 DIAGNOSIS — G4733 Obstructive sleep apnea (adult) (pediatric): Secondary | ICD-10-CM | POA: Diagnosis present

## 2023-08-13 DIAGNOSIS — Z96653 Presence of artificial knee joint, bilateral: Secondary | ICD-10-CM | POA: Diagnosis present

## 2023-08-13 HISTORY — DX: Toxic gastroenteritis and colitis: K52.1

## 2023-08-13 LAB — COMPREHENSIVE METABOLIC PANEL WITH GFR
ALT: 15 U/L (ref 0–44)
AST: 29 U/L (ref 15–41)
Albumin: 4.1 g/dL (ref 3.5–5.0)
Alkaline Phosphatase: 41 U/L (ref 38–126)
Anion gap: 11 (ref 5–15)
BUN: 14 mg/dL (ref 8–23)
CO2: 25 mmol/L (ref 22–32)
Calcium: 9.1 mg/dL (ref 8.9–10.3)
Chloride: 83 mmol/L — ABNORMAL LOW (ref 98–111)
Creatinine, Ser: 1.07 mg/dL — ABNORMAL HIGH (ref 0.44–1.00)
GFR, Estimated: 51 mL/min — ABNORMAL LOW (ref >60)
Glucose, Bld: 99 mg/dL (ref 70–99)
Potassium: 3.1 mmol/L — ABNORMAL LOW (ref 3.5–5.1)
Sodium: 119 mmol/L — CL (ref 135–145)
Total Bilirubin: 0.9 mg/dL (ref 0.0–1.2)
Total Protein: 6.9 g/dL (ref 6.5–8.1)

## 2023-08-13 LAB — GLUCOSE, CAPILLARY
Glucose-Capillary: 101 mg/dL — ABNORMAL HIGH (ref 70–99)
Glucose-Capillary: 103 mg/dL — ABNORMAL HIGH (ref 70–99)

## 2023-08-13 LAB — CBC
HCT: 32.3 % — ABNORMAL LOW (ref 36.0–46.0)
Hemoglobin: 11.1 g/dL — ABNORMAL LOW (ref 12.0–15.0)
MCH: 29.8 pg (ref 26.0–34.0)
MCHC: 34.4 g/dL (ref 30.0–36.0)
MCV: 86.6 fL (ref 80.0–100.0)
Platelets: 411 10*3/uL — ABNORMAL HIGH (ref 150–400)
RBC: 3.73 MIL/uL — ABNORMAL LOW (ref 3.87–5.11)
RDW: 12.8 % (ref 11.5–15.5)
WBC: 6.2 10*3/uL (ref 4.0–10.5)
nRBC: 0 % (ref 0.0–0.2)

## 2023-08-13 LAB — SODIUM: Sodium: 125 mmol/L — ABNORMAL LOW (ref 135–145)

## 2023-08-13 LAB — LIPASE, BLOOD: Lipase: 30 U/L (ref 11–51)

## 2023-08-13 LAB — PHOSPHORUS: Phosphorus: 2.9 mg/dL (ref 2.5–4.6)

## 2023-08-13 LAB — TSH: TSH: 1.125 u[IU]/mL (ref 0.350–4.500)

## 2023-08-13 LAB — MAGNESIUM: Magnesium: 2.4 mg/dL (ref 1.7–2.4)

## 2023-08-13 LAB — OSMOLALITY: Osmolality: 257 mosm/kg — ABNORMAL LOW (ref 275–295)

## 2023-08-13 MED ORDER — OXYCODONE HCL ER 10 MG PO T12A
20.0000 mg | EXTENDED_RELEASE_TABLET | Freq: Two times a day (BID) | ORAL | Status: DC
  Administered 2023-08-13 – 2023-08-16 (×6): 20 mg via ORAL
  Filled 2023-08-13 (×6): qty 2

## 2023-08-13 MED ORDER — LORATADINE 10 MG PO TABS
10.0000 mg | ORAL_TABLET | Freq: Every day | ORAL | Status: DC
  Administered 2023-08-13 – 2023-08-16 (×4): 10 mg via ORAL
  Filled 2023-08-13 (×4): qty 1

## 2023-08-13 MED ORDER — CLOPIDOGREL BISULFATE 75 MG PO TABS
75.0000 mg | ORAL_TABLET | Freq: Every day | ORAL | Status: DC
  Administered 2023-08-13 – 2023-08-16 (×4): 75 mg via ORAL
  Filled 2023-08-13 (×4): qty 1

## 2023-08-13 MED ORDER — DIPHENOXYLATE-ATROPINE 2.5-0.025 MG PO TABS
1.0000 | ORAL_TABLET | Freq: Four times a day (QID) | ORAL | Status: DC | PRN
  Administered 2023-08-14 (×2): 1 via ORAL
  Filled 2023-08-13 (×2): qty 1

## 2023-08-13 MED ORDER — PANTOPRAZOLE SODIUM 40 MG PO TBEC
40.0000 mg | DELAYED_RELEASE_TABLET | Freq: Every day | ORAL | Status: DC
  Administered 2023-08-13 – 2023-08-16 (×4): 40 mg via ORAL
  Filled 2023-08-13 (×4): qty 1

## 2023-08-13 MED ORDER — ASPIRIN 81 MG PO TBEC
81.0000 mg | DELAYED_RELEASE_TABLET | Freq: Every day | ORAL | Status: DC
  Administered 2023-08-13 – 2023-08-15 (×3): 81 mg via ORAL
  Filled 2023-08-13 (×3): qty 1

## 2023-08-13 MED ORDER — MELATONIN 5 MG PO TABS
10.0000 mg | ORAL_TABLET | Freq: Every evening | ORAL | Status: DC | PRN
  Administered 2023-08-13 – 2023-08-15 (×2): 10 mg via ORAL
  Filled 2023-08-13 (×2): qty 2

## 2023-08-13 MED ORDER — POTASSIUM CHLORIDE CRYS ER 20 MEQ PO TBCR
40.0000 meq | EXTENDED_RELEASE_TABLET | Freq: Once | ORAL | Status: AC
  Administered 2023-08-13: 40 meq via ORAL
  Filled 2023-08-13: qty 2

## 2023-08-13 MED ORDER — ESCITALOPRAM OXALATE 10 MG PO TABS
20.0000 mg | ORAL_TABLET | Freq: Every day | ORAL | Status: DC
  Administered 2023-08-13 – 2023-08-16 (×4): 20 mg via ORAL
  Filled 2023-08-13 (×4): qty 2

## 2023-08-13 MED ORDER — POTASSIUM CHLORIDE 20 MEQ PO PACK
40.0000 meq | PACK | Freq: Once | ORAL | Status: AC
  Administered 2023-08-13: 40 meq via ORAL
  Filled 2023-08-13: qty 2

## 2023-08-13 MED ORDER — ENOXAPARIN SODIUM 60 MG/0.6ML IJ SOSY: 45.0000 mg | PREFILLED_SYRINGE | INTRAMUSCULAR | Status: DC

## 2023-08-13 MED ORDER — MELATONIN 10 MG PO TABS: 10.0000 mg | ORAL_TABLET | Freq: Every day | ORAL | Status: DC

## 2023-08-13 MED ORDER — ENOXAPARIN SODIUM 60 MG/0.6ML IJ SOSY
42.5000 mg | PREFILLED_SYRINGE | INTRAMUSCULAR | Status: DC
  Administered 2023-08-13 – 2023-08-15 (×3): 42.5 mg via SUBCUTANEOUS
  Filled 2023-08-13 (×3): qty 0.6

## 2023-08-13 MED ORDER — INSULIN ASPART 100 UNIT/ML IJ SOLN
0.0000 [IU] | Freq: Three times a day (TID) | INTRAMUSCULAR | Status: DC
Start: 2023-08-13 — End: 2023-08-16

## 2023-08-13 MED ORDER — ONDANSETRON HCL 4 MG/2ML IJ SOLN
4.0000 mg | Freq: Four times a day (QID) | INTRAMUSCULAR | Status: DC | PRN
Start: 2023-08-13 — End: 2023-08-16

## 2023-08-13 MED ORDER — PREDNISONE 10 MG PO TABS
5.0000 mg | ORAL_TABLET | Freq: Two times a day (BID) | ORAL | Status: DC
Start: 2023-08-13 — End: 2023-08-16
  Administered 2023-08-13 – 2023-08-16 (×6): 5 mg via ORAL
  Filled 2023-08-13 (×6): qty 1

## 2023-08-13 MED ORDER — FENOFIBRATE 54 MG PO TABS
54.0000 mg | ORAL_TABLET | Freq: Every day | ORAL | Status: DC
  Administered 2023-08-13 – 2023-08-16 (×4): 54 mg via ORAL
  Filled 2023-08-13 (×4): qty 1

## 2023-08-13 MED ORDER — NITROGLYCERIN 0.4 MG SL SUBL: 0.4000 mg | SUBLINGUAL_TABLET | SUBLINGUAL | Status: DC | PRN

## 2023-08-13 MED ORDER — FLUTICASONE PROPIONATE 50 MCG/ACT NA SUSP
1.0000 | Freq: Every day | NASAL | Status: DC
  Administered 2023-08-14 – 2023-08-16 (×3): 1 via NASAL
  Filled 2023-08-13 (×2): qty 16

## 2023-08-13 MED ORDER — SODIUM CHLORIDE 0.9 % IV BOLUS
1000.0000 mL | Freq: Once | INTRAVENOUS | Status: AC
  Administered 2023-08-13: 1000 mL via INTRAVENOUS

## 2023-08-13 MED ORDER — GABAPENTIN 300 MG PO CAPS: 300.0000 mg | ORAL_CAPSULE | Freq: Two times a day (BID) | ORAL | Status: DC | PRN

## 2023-08-13 MED ORDER — DIAZEPAM 5 MG PO TABS: 5.0000 mg | ORAL_TABLET | Freq: Two times a day (BID) | ORAL | Status: DC | PRN

## 2023-08-13 MED ORDER — ONDANSETRON HCL 4 MG PO TABS: 4.0000 mg | ORAL_TABLET | Freq: Four times a day (QID) | ORAL | Status: DC | PRN

## 2023-08-13 MED ORDER — ALBUTEROL SULFATE (2.5 MG/3ML) 0.083% IN NEBU: 2.5000 mg | INHALATION_SOLUTION | Freq: Four times a day (QID) | RESPIRATORY_TRACT | Status: DC | PRN

## 2023-08-13 MED ORDER — HYDRALAZINE HCL 20 MG/ML IJ SOLN: 5.0000 mg | Freq: Four times a day (QID) | INTRAMUSCULAR | Status: DC | PRN

## 2023-08-13 MED ORDER — LEVOCETIRIZINE DIHYDROCHLORIDE 5 MG PO TABS: 5.0000 mg | ORAL_TABLET | Freq: Every evening | ORAL | Status: DC

## 2023-08-13 NOTE — ED Provider Notes (Signed)
 [facility] Provider Note   Event Date/Time   First MD Initiated Contact with Patient 08/13/23 1457     (approximate) History  Diarrhea  HPI Hayley Knight is a 88 y.o. female with stated past medical history of type 2 diabetes, hypertension, cholecystectomy, and appendectomy who presents complaining of diarrhea over the last 2 weeks.  Patient describes it as watery and sometimes clear.  Patient denies any dark tarry aspect or black coloration.  Patient denies any nausea or vomiting.  Patient does state that she is unable to eat a significant amount due to starting Mounjaro  2 weeks ago when the symptoms began.  Patient states that her diarrhea worsens whenever she eats something. ROS: Patient currently denies any vision changes, tinnitus, difficulty speaking, facial droop, sore throat, chest pain, shortness of breath, abdominal pain, nausea/vomiting, dysuria, or weakness/numbness/paresthesias in any extremity   Physical Exam  Triage Vital Signs: ED Triage Vitals [08/13/23 1425]  Encounter Vitals Group     BP (!) 122/55     Systolic BP Percentile      Diastolic BP Percentile      Pulse Rate 72     Resp 18     Temp 97.7 F (36.5 C)     Temp Source Oral     SpO2 95 %     Weight      Height      Head Circumference      Peak Flow      Pain Score      Pain Loc      Pain Education      Exclude from Growth Chart    Most recent vital signs: Vitals:   08/13/23 1425 08/13/23 1500  BP: (!) 122/55 (!) 101/53  Pulse: 72 65  Resp: 18   Temp: 97.7 F (36.5 C)   SpO2: 95% 94%   General: Awake, oriented x4. CV:  Good peripheral perfusion.  Resp:  Normal effort.  Abd:  No distention.  Other:  Elderly obese Caucasian female resting comfortably in no acute distress ED Results / Procedures / Treatments  Labs (all labs ordered are listed, but only abnormal results are displayed) Labs Reviewed  COMPREHENSIVE METABOLIC PANEL WITH GFR - Abnormal; Notable for  the following components:      Result Value   Sodium 119 (*)    Potassium 3.1 (*)    Chloride 83 (*)    Creatinine, Ser 1.07 (*)    GFR, Estimated 51 (*)    All other components within normal limits  CBC - Abnormal; Notable for the following components:   RBC 3.73 (*)    Hemoglobin 11.1 (*)    HCT 32.3 (*)    Platelets 411 (*)    All other components within normal limits  GASTROINTESTINAL PANEL BY PCR, STOOL (REPLACES STOOL CULTURE)  LIPASE, BLOOD  URINALYSIS, ROUTINE W REFLEX MICROSCOPIC   PROCEDURES: Critical Care performed: No Procedures MEDICATIONS ORDERED IN ED: Medications  potassium chloride  (KLOR-CON ) packet 40 mEq (has no administration in time range)  sodium chloride  0.9 % bolus 1,000 mL (1,000 mLs Intravenous New Bag/Given 08/13/23 1520)   IMPRESSION / MDM / ASSESSMENT AND PLAN / ED COURSE  I reviewed the triage vital signs and the nursing notes.                             The patient is on the cardiac monitor to evaluate for evidence of  arrhythmia and/or significant heart rate changes. Patient's presentation is most consistent with acute presentation with potential threat to life or bodily function. Patient found to be hyponatremic to 119 with a potassium of 3.1. Patient mentating normally. Patient not hypovolemic so doubt extra renal losses such as GI losses, burns, 3rd spacing, or diuretic use. Labs are not consistent with adrenal insufficiency. 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 Patient not hypervolemic on exam with no history of CHF, cirrhosis, nephrotic syndrome, no acute renal failure. Tx: 1L NS Given patient's significant weakness and hypotension, she will require admission to the internal medicine service for further stabilization of her hyponatremia Dispo: Admit to medicine   FINAL CLINICAL IMPRESSION(S) / ED DIAGNOSES   Final diagnoses:  Diarrhea, unspecified type  Dehydration  Hyponatremia  Hypokalemia   Rx / DC Orders   ED  Discharge Orders     None      Note:  This document was prepared using Dragon voice recognition software and may include unintentional dictation errors.   Odus Clasby K, MD 08/13/23 [phone]

## 2023-08-13 NOTE — H&P (Addendum)
 History and Physical    MEMORY HEINRICHS ONG:[id number] DOB: 03/12/1936 DOA: 08/13/2023  PCP: Alica Antu, NP (Confirm with patient/family/NH records and if not entered, this has to be entered at [facility] point of entry) Patient coming from: Home  I have personally briefly reviewed patient's old medical records in [facility]  Chief Complaint: Diarrhea, feeling weak  HPI: Hayley Knight is a 88 y.o. female with medical history significant of IIDM, morbid obesity, HTN, OSA, chronic hyponatremia presented with persistent diarrhea and worsening of general weakness.  Patient was started on Mounjaro  injection about 2 weeks ago for weight loss, and this time started about 10 days ago, patient started to have diarrhea, initially was mild, 1-2 times a day.  Then patient received a second dose of Mounjaro  injection 2 days ago, this time however diarrhea became more severe, more than 10 times a day, watery, no foul smell, but denied any abdominal pain no nauseous vomiting no fever or chills.  She has been taking as needed Imodium  for last 7+ days with no significant improvement.  To hydrate herself she has been taking excessive amount of water.  Last 2 days she been feeling very weak and no appetite.  Denied any double vision blurry vision headache lightheadedness no numbness weakness on either side of the limbs.  ED Course: Afebrile, nontachycardic blood pressure borderline low 101/53 O2 saturation 95% on room air.  Blood work showed sodium 119 potassium 3.1 bicarb 25 BUN 14 creatinine 1.0 WBC 6.2 hemoglobin 11.1.  Patient was given 1000 mL IV bolus x 1 and p.o. KCl x 1  Review of Systems: As per HPI otherwise 14 point review of systems negative.    Past Medical History:  Diagnosis Date   Anxiety 05/09/2018   Arthritis    Aspiration pneumonia of left lower lobe (HCC) 07/04/2022   CAP (community acquired pneumonia) 07/02/2022   Cough 10/21/2021   Diabetes mellitus without complication (HCC)     Type II   Dyspnea 07/09/2021   Fatigue due to exposure 06/15/2022   Hypertension    Hyponatremia 10/21/2021   Sleep apnea    No Cpap   SOB (shortness of breath) 10/21/2021   Viral upper respiratory tract infection 06/15/2022    Past Surgical History:  Procedure Laterality Date   ABDOMINAL HYSTERECTOMY     APPENDECTOMY     CARDIAC CATHETERIZATION     CHOLECYSTECTOMY     DILATION AND CURETTAGE OF UTERUS     MASTECTOMY Right    REPLACEMENT TOTAL KNEE BILATERAL     RIGHT/LEFT HEART CATH AND CORONARY ANGIOGRAPHY Bilateral 07/09/2021   Procedure: RIGHT/LEFT HEART CATH AND CORONARY ANGIOGRAPHY;  Surgeon: Antonette Batters, MD;  Location: [facility] INVASIVE CV LAB;  Service: Cardiovascular;  Laterality: Bilateral;   TOOTH EXTRACTION N/A 01/02/2022   Procedure: DENTAL RESTORATION/EXTRACTIONS;  Surgeon: Ascencion Lava, DMD;  Location: [facility] OR;  Service: Oral Surgery;  Laterality: N/A;     reports that she has never smoked. She has never been exposed to tobacco smoke. She has never used smokeless tobacco. She reports that she does not drink alcohol and does not use drugs.  Allergies  Allergen Reactions   Iodine Swelling   Ivp Dye [Iodinated Contrast Media] Swelling   Sulfa Antibiotics Swelling    History reviewed. No pertinent family history.   Prior to Admission medications   Medication Sig Start Date End Date Taking? Authorizing Provider  albuterol  (VENTOLIN  HFA) 108 (90 Base) MCG/ACT inhaler Inhale 2 puffs into the  lungs every 6 (six) hours as needed for wheezing or shortness of breath. 05/31/23   Scoggins, Amber, NP  amLODipine  (NORVASC ) 2.5 MG tablet TAKE 1 TABLET BY MOUTH DAILY FOR ELEVATED BLOOD PRESSURE 10/03/22   Trenda Frisk, FNP  aspirin  81 MG EC tablet Take 81 mg by mouth at bedtime. 03/08/15   [provider]  Cholecalciferol  (VITAMIN D3) 125 MCG (5000 UT) CAPS Take 1 capsule (5,000 Units total) by mouth daily. 01/02/22   Ascencion Lava, DMD  clopidogrel  (PLAVIX ) 75 MG  tablet TAKE 1 TABLET BY MOUTH EVERY DAY 09/10/22   Boswell, Chelsa, NP  diazepam  (VALIUM ) 5 MG tablet Take 5 mg by mouth every 12 (twelve) hours as needed for anxiety.    [provider]  escitalopram  (LEXAPRO ) 20 MG tablet TAKE 1 TABLET BY MOUTH EVERY DAY 09/23/22   Boswell, Chelsa, NP  fenofibrate  (TRICOR ) 145 MG tablet Take 1 tablet (145 mg total) by mouth at bedtime. 05/31/23   Scoggins, Amber, NP  fluticasone  (FLONASE ) 50 MCG/ACT nasal spray Place 1 spray into both nostrils daily. 08/02/23 08/01/24  Scoggins, Amber, NP  furosemide  (LASIX ) 40 MG tablet Take 1 tablet (40 mg total) by mouth daily. 04/15/23   Scoggins, Amber, NP  gabapentin  (NEURONTIN ) 300 MG capsule Take 300 mg by mouth 2 (two) times daily as needed (Neck pain).    [provider]  hydrochlorothiazide  (HYDRODIURIL ) 12.5 MG tablet TAKE 1 TABLET BY MOUTH EVERY DAY 06/10/23   Scoggins, Hospital doctor, NP  levocetirizine (XYZAL  ALLERGY 24HR) 5 MG tablet Take 1 tablet (5 mg total) by mouth every evening. 08/02/23   Scoggins, Hospital doctor, NP  losartan  (COZAAR ) 100 MG tablet TAKE 1 TABLET BY MOUTH EVERY DAY 06/09/23   Scoggins, Hospital doctor, NP  Melatonin 10 MG TABS Take 10 mg by mouth at bedtime. 07/13/21   [provider]  Multiple Vitamin (MULTIVITAMIN) capsule Take 2 capsules by mouth daily.    [provider]  nitroGLYCERIN  (NITROSTAT ) 0.4 MG SL tablet Place 0.4 mg under the tongue every 5 (five) minutes as needed for chest pain. 07/13/21 02/11/23  [provider]  omega-3 acid ethyl esters (LOVAZA ) 1 g capsule TAKE 2 CAPSULES BY MOUTH 2 TIMES DAILY. 06/23/23   Scoggins, Amber, NP  omeprazole  (PRILOSEC) 40 MG capsule TAKE 1 CAPSULE (40 MG TOTAL) BY MOUTH DAILY. 05/17/23   Scoggins, Amber, NP  oxyCODONE  (OXYCONTIN ) 10 mg 12 hr tablet Take 20 mg by mouth every 12 (twelve) hours.    [provider]  potassium chloride  (KLOR-CON ) 10 MEQ tablet TAKE 1 TABLET BY MOUTH EVERY DAY 08/12/23   Scoggins, Hospital doctor, NP  predniSONE   (DELTASONE ) 5 MG tablet Take 1 tablet (5 mg total) by mouth 2 (two) times daily with a meal. 03/08/23   Scoggins, Hospital doctor, NP  sitaGLIPtin  (JANUVIA ) 100 MG tablet Take 1 tablet (100 mg total) by mouth daily. 04/23/23   Aisha Hove, MD  tirzepatide  (MOUNJARO ) 2.5 MG/0.5ML Pen Inject 2.5 mg into the skin once a week. 08/02/23   Scoggins, Hospital doctor, NP    Physical Exam: Vitals:   08/13/23 1425 08/13/23 1500  BP: (!) 122/55 (!) 101/53  Pulse: 72 65  Resp: 18   Temp: 97.7 F (36.5 C)   TempSrc: Oral   SpO2: 95% 94%    Constitutional: NAD, calm, comfortable Vitals:   08/13/23 1425 08/13/23 1500  BP: (!) 122/55 (!) 101/53  Pulse: 72 65  Resp: 18   Temp: 97.7 F (36.5 C)   TempSrc: Oral  SpO2: 95% 94%   Eyes: PERRL, lids and conjunctivae normal ENMT: Mucous membranes are moist. Posterior pharynx clear of any exudate or lesions.Normal dentition.  Neck: normal, supple, no masses, no thyromegaly Respiratory: clear to auscultation bilaterally, no wheezing, no crackles. Normal respiratory effort. No accessory muscle use.  Cardiovascular: Regular rate and rhythm, no murmurs / rubs / gallops. No extremity edema. 2+ pedal pulses. No carotid bruits.  Abdomen: no tenderness, no masses palpated. No hepatosplenomegaly. Bowel sounds positive.  Musculoskeletal: no clubbing / cyanosis. No joint deformity upper and lower extremities. Good ROM, no contractures. Normal muscle tone.  Skin: no rashes, lesions, ulcers. No induration Neurologic: CN 2-12 grossly intact. Sensation intact, DTR normal. Strength 5/5 in all 4.  Psychiatric: Normal judgment and insight. Alert and oriented x 3. Normal mood.     Labs on Admission: I have personally reviewed following labs and imaging studies  CBC: Recent Labs  Lab 08/13/23 1430  WBC 6.2  HGB 11.1*  HCT 32.3*  MCV 86.6  PLT 411*   Basic Metabolic Panel: Recent Labs  Lab 08/13/23 1430  NA 119*  K 3.1*  CL 83*  CO2 25  GLUCOSE 99  BUN 14  CREATININE  1.07*  CALCIUM  9.1   GFR: Estimated Creatinine Clearance: 36.6 mL/min (A) (by C-G formula based on SCr of 1.07 mg/dL (H)). Liver Function Tests: Recent Labs  Lab 08/13/23 1430  AST 29  ALT 15  ALKPHOS 41  BILITOT 0.9  PROT 6.9  ALBUMIN 4.1   Recent Labs  Lab 08/13/23 1430  LIPASE 30   No results for input(s): "AMMONIA" in the last 168 hours. Coagulation Profile: No results for input(s): "INR", "PROTIME" in the last 168 hours. Cardiac Enzymes: No results for input(s): "CKTOTAL", "CKMB", "CKMBINDEX", "TROPONINI" in the last 168 hours. BNP (last 3 results) No results for input(s): "PROBNP" in the last 8760 hours. HbA1C: No results for input(s): "HGBA1C" in the last 72 hours. CBG: No results for input(s): "GLUCAP" in the last 168 hours. Lipid Profile: No results for input(s): "CHOL", "HDL", "LDLCALC", "TRIG", "CHOLHDL", "LDLDIRECT" in the last 72 hours. Thyroid  Function Tests: No results for input(s): "TSH", "T4TOTAL", "FREET4", "T3FREE", "THYROIDAB" in the last 72 hours. Anemia Panel: No results for input(s): "VITAMINB12", "FOLATE", "FERRITIN", "TIBC", "IRON", "RETICCTPCT" in the last 72 hours. Urine analysis:    Component Value Date/Time   COLORURINE STRAW (A) 10/21/2021 2152   APPEARANCEUR Clear 04/06/2022 1141   LABSPEC 1.006 10/21/2021 2152   LABSPEC 1.015 06/02/2014 1526   PHURINE 6.0 10/21/2021 2152   GLUCOSEU Negative 04/06/2022 1141   GLUCOSEU Negative 06/02/2014 1526   HGBUR MODERATE (A) 10/21/2021 2152   BILIRUBINUR Negative 04/06/2022 1141   BILIRUBINUR Negative 06/02/2014 1526   KETONESUR NEGATIVE 10/21/2021 2152   PROTEINUR Negative 04/06/2022 1141   PROTEINUR NEGATIVE 10/21/2021 2152   NITRITE Negative 04/06/2022 1141   NITRITE NEGATIVE 10/21/2021 2152   LEUKOCYTESUR Negative 04/06/2022 1141   LEUKOCYTESUR NEGATIVE 10/21/2021 2152   LEUKOCYTESUR Negative 06/02/2014 1526    Radiological Exams on Admission: No results found.  EKG:  None  Assessment/Plan Active Problems:   Hyponatremia   Diarrhea due to drug  (please populate well all problems here in Problem List. (For example, if patient is on BP meds at home and you resume or decide to hold them, it is a problem that needs to be her. Same for CAD, COPD, HLD and so on)  Hyponatremia, acute on chronic - Clinically appears to be euvolemic to mild volume  contraction, likely secondary to GI tract loss as well as excessive amount of water drinking. - Check hyponatremia study, osmolality urine osmolality and urine sodium level - Apply free water restriction, encouraged to drink more isotonic fluid such as Gatorade/Pedialyte. - Recheck sodium level this evening, plan to correct sodium level less than 0.5 mEq every hour. - Low suspicion for infectious etiology.  Hold off stool study - Discontinue hydrochlorothiazide   Acute diarrhea, failed outpatient management - Most likely secondary to Mounjaro  side effect - No other significant systemic infection signs such as leukocytosis or fever, low suspicion for infectious diarrhea - Trial of Lomotil   Hypokalemia - Secondary to GI loss/diarrhea - P.o. replacement - Check magnesium  and phosphorus level  IIDM -SSI  HTN -Blood pressure borderline low - Hold off home BP meds including HCTZ, lisinopril and Imdur and p.o. Lasix  - Start as needed hydralazine   OA - Continue as needed narcotics and steroid  Morbid obesity - Estimated BMI> 35 - Hold off Mounjaro  therapy given the severe side effects  Deconditioning - PT evaluation  Family Communication: None at bedside CODE STATUS: Full code DVT prophylaxis: Lovenox  Disposition Plan: Patient is sick with hyponatremia less than 120 requiring slow correction sodium level, expect more than 2 midnight hospital stay Consults called: None  Admission status: Tele admit   [place] MD [facility] Pager [phone]  08/13/2023, 4:20 PM

## 2023-08-13 NOTE — Progress Notes (Signed)
 PHARMACIST - PHYSICIAN COMMUNICATION  CONCERNING:  Enoxaparin  (Lovenox ) for DVT Prophylaxis   RECOMMENDATION: Patient was prescribed enoxaprin 40mg  q24 hours for VTE prophylaxis.   There were no vitals filed for this visit.  There is no height or weight on file to calculate BMI.  Estimated Creatinine Clearance: 36.6 mL/min (A) (by C-G formula based on SCr of 1.07 mg/dL (H)).  Based on [facility] policy patient is candidate for enoxaparin  0.5mg /kg TBW SQ every 24 hours based on BMI being >30.  Calculated BMI as 36.7  DESCRIPTION: Pharmacy has adjusted enoxaparin  dose per [facility] policy.  Patient is now receiving enoxaparin  42 mg every 24 hours   Craven Do, PharmD Pharmacy Resident  08/13/2023 4:19 PM

## 2023-08-13 NOTE — ED Triage Notes (Signed)
 Pt comes via ems from home with diarrhea. Pt states two weeks ago she started weight lost shot. Pt was ok but then thi week started the diarrhea. Pt up her shot and now has explosive diarrhea.   VSS

## 2023-08-14 DIAGNOSIS — E871 Hypo-osmolality and hyponatremia: Secondary | ICD-10-CM | POA: Diagnosis not present

## 2023-08-14 DIAGNOSIS — E876 Hypokalemia: Secondary | ICD-10-CM | POA: Diagnosis not present

## 2023-08-14 DIAGNOSIS — K521 Toxic gastroenteritis and colitis: Secondary | ICD-10-CM | POA: Diagnosis not present

## 2023-08-14 LAB — GASTROINTESTINAL PANEL BY PCR, STOOL (REPLACES STOOL CULTURE)

## 2023-08-14 LAB — BASIC METABOLIC PANEL WITH GFR
Anion gap: 10 (ref 5–15)
BUN: 13 mg/dL (ref 8–23)
CO2: 28 mmol/L (ref 22–32)
Calcium: 9.4 mg/dL (ref 8.9–10.3)
Chloride: 90 mmol/L — ABNORMAL LOW (ref 98–111)
Creatinine, Ser: 0.86 mg/dL (ref 0.44–1.00)
GFR, Estimated: >60 mL/min (ref >60)
Glucose, Bld: 99 mg/dL (ref 70–99)
Potassium: 4.1 mmol/L (ref 3.5–5.1)
Sodium: 128 mmol/L — ABNORMAL LOW (ref 135–145)

## 2023-08-14 LAB — C DIFFICILE QUICK SCREEN W PCR REFLEX
C Diff antigen: NEGATIVE
C Diff interpretation: NOT DETECTED
C Diff toxin: NEGATIVE

## 2023-08-14 LAB — GLUCOSE, CAPILLARY
Glucose-Capillary: 109 mg/dL — ABNORMAL HIGH (ref 70–99)
Glucose-Capillary: 107 mg/dL — ABNORMAL HIGH (ref 70–99)
Glucose-Capillary: 102 mg/dL — ABNORMAL HIGH (ref 70–99)
Glucose-Capillary: 111 mg/dL — ABNORMAL HIGH (ref 70–99)

## 2023-08-14 MED ORDER — PANCRELIPASE (LIP-PROT-AMYL) 12000-38000 UNITS PO CPEP
24000.0000 [IU] | ORAL_CAPSULE | Freq: Three times a day (TID) | ORAL | Status: DC
  Administered 2023-08-14 – 2023-08-16 (×5): 24000 [IU] via ORAL
  Filled 2023-08-14 (×5): qty 2

## 2023-08-14 MED ORDER — ACETAMINOPHEN 500 MG PO TABS
500.0000 mg | ORAL_TABLET | Freq: Four times a day (QID) | ORAL | Status: DC | PRN
  Administered 2023-08-14: 500 mg via ORAL
  Filled 2023-08-14: qty 1

## 2023-08-14 MED ORDER — LOPERAMIDE HCL 2 MG PO CAPS
2.0000 mg | ORAL_CAPSULE | Freq: Three times a day (TID) | ORAL | Status: DC
  Administered 2023-08-14 – 2023-08-15 (×5): 2 mg via ORAL
  Filled 2023-08-14 (×5): qty 1

## 2023-08-14 MED ORDER — SODIUM CHLORIDE 0.9 % IV SOLN: INTRAVENOUS | Status: DC

## 2023-08-14 NOTE — Evaluation (Signed)
 Physical Therapy Evaluation Patient Details Name: Hayley Knight MRN: [id number] DOB: 1936-03-22 Today's Date: 08/14/2023  History of Present Illness  presented to ER secondary to diarrhea, weakness (recently started Mounjaro ); admitted for management of acute/chronic hyponatremia  Clinical Impression  Patient resting in bed upon arrival; alert and oriented, follows commands and agreeable to participation with session with min encouragement (initially telling therapist, "I don't need no therapy").  Endorses chronic back pain with movement and position change (FACES 2/10); improved with repositioning and OOB activities.  Bilat UE/LE strength and ROM grossly symmetrical and WFL; no focal weakness appreciated.  Able to complete bed mobility with sup; sit/stand, basic transfers and gait (40') without assist device, cga.  Demonstrates reciprocal stepping pattern with narrowed BOS, decreased trunk rotation/arm swing; mild sway, scissoring with head turns and changes of direction.  May trial RW in subsequent sessions if patient agreeable (refuses RW and additional distance this date). Would benefit from skilled PT to address above deficits and promote optimal return to PLOF.; recommend post-acute PT follow up as indicated by interdisciplinary care team.          If plan is discharge home, recommend the following: A little help with walking and/or transfers;A little help with bathing/dressing/bathroom   Can travel by private vehicle        Equipment Recommendations    Recommendations for Other Services       Functional Status Assessment Patient has had a recent decline in their functional status and demonstrates the ability to make significant improvements in function in a reasonable and predictable amount of time.     Precautions / Restrictions Precautions Precautions: Fall Restrictions Weight Bearing Restrictions Per Provider Order: No      Mobility  Bed Mobility Overal bed mobility:  Needs Assistance Bed Mobility: Supine to Sit     Supine to sit: Supervision          Transfers Overall transfer level: Needs assistance Equipment used: None Transfers: Sit to/from Stand, Bed to chair/wheelchair/BSC Sit to Stand: Contact guard assist Stand pivot transfers: Contact guard assist              Ambulation/Gait Ambulation/Gait assistance: Contact guard assist Gait Distance (Feet): 40 Feet Assistive device: None         General Gait Details: reciprocal stepping pattern with narrowed BOS, decreased trunk rotation/arm swing; mild sway, scissoring with head turns and changes of direction  Stairs            Wheelchair Mobility     Tilt Bed    Modified Rankin (Stroke Patients Only)       Balance Overall balance assessment: Needs assistance Sitting-balance support: No upper extremity supported, Feet supported Sitting balance-Leahy Scale: Good     Standing balance support: Bilateral upper extremity supported Standing balance-Leahy Scale: Fair                               Pertinent Vitals/Pain Pain Assessment Pain Assessment: Faces Faces Pain Scale: Hurts a little bit Pain Location: chronic back pain Pain Descriptors / Indicators: Aching, Grimacing Pain Intervention(s): Limited activity within patient's tolerance, Monitored during session, Repositioned    Home Living Family/patient expects to be discharged to:: Private residence Living Arrangements: Alone Available Help at Discharge: Family Type of Home: Apartment Home Access: Level entry       Home Layout: One level Home Equipment: None      Prior Function Prior Level of  Function : Independent/Modified Independent             Mobility Comments: Mod indep without assist device for ADLs, household and community mobilization; denies fall history.  Has home health aide 2x/week to assist with household chores as needed. Assists daughter with transportation to/from  medical appointments (undergoing CA treatment)       Extremity/Trunk Assessment   Upper Extremity Assessment Upper Extremity Assessment: Overall WFL for tasks assessed    Lower Extremity Assessment Lower Extremity Assessment: Overall WFL for tasks assessed (grossly 4/5 throughout; no focal weakness appreciated)    Cervical / Trunk Assessment Cervical / Trunk Assessment:  (mild head tremor noted at times)  Communication        Cognition Arousal: Alert Behavior During Therapy: WFL for tasks assessed/performed   PT - Cognitive impairments: No apparent impairments                         Following commands: Intact       Cueing       General Comments      Exercises     Assessment/Plan    PT Assessment Patient needs continued PT services  PT Problem List Decreased balance;Decreased mobility;Decreased activity tolerance;Decreased knowledge of use of DME;Decreased safety awareness;Decreased knowledge of precautions       PT Treatment Interventions DME instruction;Gait training;Stair training;Cognitive remediation;Functional mobility training;Therapeutic activities;Therapeutic exercise;Balance training;Neuromuscular re-education;Patient/family education    PT Goals (Current goals can be found in the Care Plan section)  Acute Rehab PT Goals Patient Stated Goal: to return home PT Goal Formulation: With patient Time For Goal Achievement: 08/28/23 Potential to Achieve Goals: Good    Frequency Min 2X/week     Co-evaluation               AM-PAC PT "6 Clicks" Mobility  Outcome Measure Help needed turning from your back to your side while in a flat bed without using bedrails?: None Help needed moving from lying on your back to sitting on the side of a flat bed without using bedrails?: None Help needed moving to and from a bed to a chair (including a wheelchair)?: A Little Help needed standing up from a chair using your arms (e.g., wheelchair or bedside  chair)?: A Little Help needed to walk in hospital room?: A Little Help needed climbing 3-5 steps with a railing? : A Little 6 Click Score: 20    End of Session   Activity Tolerance: Patient tolerated treatment well Patient left: in chair;with call bell/phone within reach;with chair alarm set Nurse Communication: Mobility status PT Visit Diagnosis: Muscle weakness (generalized) (M62.81);Difficulty in walking, not elsewhere classified (R26.2)    Time: 1610-9604 PT Time Calculation (min) (ACUTE ONLY): 20 min   Charges:   PT Evaluation $PT Eval Moderate Complexity: 1 Mod   PT General Charges $$ ACUTE PT VISIT: 1 Visit        Irwin Toran H. Bevin Bucks, PT, DPT, NCS 08/14/23, 9:22 AM [phone]

## 2023-08-14 NOTE — Progress Notes (Addendum)
  Progress Note   Patient: Hayley Knight:[id number] DOB: 05/16/1935 DOA: 08/13/2023     1 DOS: the patient was seen and examined on 08/14/2023   Brief hospital course: Hayley Knight is a 88 y.o. female with medical history significant of IIDM, morbid obesity, HTN, OSA, chronic hyponatremia presented with persistent diarrhea and worsening of general weakness.  Patient was found to have hyponatremia, hypokalemia.    Active Problems:   Hyponatremia   Diarrhea due to drug   Diarrhea   Hypokalemia   Assessment and Plan: Diarrhea secondary to medication effect. Hyponatremia, multifactorial. Hypokalemia secondary to diarrhea. Chronic kidney disease stage II. Had a worsening hyponatremia with significant weakness.  This is secondary to diarrhea, polydipsia, and hydrochlorothiazide . Condition is improving.  Continue fluid restriction.  Potassium normalized. Patient still has significant diarrhea, does not feel safe to go home.  Will start lower dose Imodium  as patient has no concern for infectious course of the diarrhea.  Reactive thrombocytosis. Secondary to diarrhea.   Type 2 diabetes Continue sliding scale insulin   Essential hypertension. Will discontinue HCTZ. She still feels some dizziness, hold off PRESSURE medicines.  Class II diabetes with BMI 38.02 with comorbidities. Obstructive sleep apnea. Continue to follow.  Follow-up with PCP.  Addendum: patient had more diarrhea, Added creon , started NS at 50ml/hr, send for c diff.    Subjective:  Patient still complaining diarrhea, no nausea vomiting.  No abdominal pain.  Physical Exam: Vitals:   08/13/23 1824 08/13/23 1956 08/14/23 0520 08/14/23 0818  BP:  (!) 97/48 (!) 100/43 (!) 121/51  Pulse:  62 65 63  Resp:  15 17   Temp:   98.2 F (36.8 C)   TempSrc:      SpO2:  97% 94% 97%  Weight: 88.3 kg     Height: 5' (1.524 m)      General exam: Appears calm and comfortable, obese. Respiratory system: Clear to  auscultation. Respiratory effort normal. Cardiovascular system: S1 & S2 heard, RRR. No JVD, murmurs, rubs, gallops or clicks. No pedal edema. Gastrointestinal system: Abdomen is nondistended, soft and nontender. No organomegaly or masses felt. Normal bowel sounds heard. Central nervous system: Alert and oriented. No focal neurological deficits. Extremities: Symmetric 5 x 5 power. Skin: No rashes, lesions or ulcers Psychiatry: Judgement and insight appear normal. Mood & affect appropriate.    Data Reviewed:  Lab results reviewed.  Family Communication: None  Disposition: Status is: Inpatient Remains inpatient appropriate because: Severity of disease.     Time spent: 35 minutes  Author: Donaciano Frizzle, MD 08/14/2023 11:29 AM  For on call review [email or web address].

## 2023-08-14 NOTE — Hospital Course (Addendum)
 Hayley Knight is a 88 y.o. female with medical history significant of IIDM, morbid obesity, HTN, OSA, chronic hyponatremia presented with persistent diarrhea and worsening of general weakness.  Patient was found to have hyponatremia, hypokalemia.  Found to have improved, diarrhea resolved.  Medically stable for discharge.

## 2023-08-15 DIAGNOSIS — E876 Hypokalemia: Secondary | ICD-10-CM | POA: Diagnosis not present

## 2023-08-15 DIAGNOSIS — E871 Hypo-osmolality and hyponatremia: Secondary | ICD-10-CM | POA: Diagnosis not present

## 2023-08-15 DIAGNOSIS — K521 Toxic gastroenteritis and colitis: Secondary | ICD-10-CM | POA: Diagnosis not present

## 2023-08-15 LAB — BASIC METABOLIC PANEL WITH GFR
Anion gap: 8 (ref 5–15)
BUN: 11 mg/dL (ref 8–23)
CO2: 24 mmol/L (ref 22–32)
Calcium: 8.9 mg/dL (ref 8.9–10.3)
Chloride: 96 mmol/L — ABNORMAL LOW (ref 98–111)
Creatinine, Ser: 0.84 mg/dL (ref 0.44–1.00)
GFR, Estimated: >60 mL/min (ref >60)
Glucose, Bld: 116 mg/dL — ABNORMAL HIGH (ref 70–99)
Potassium: 4.1 mmol/L (ref 3.5–5.1)
Sodium: 128 mmol/L — ABNORMAL LOW (ref 135–145)

## 2023-08-15 LAB — GLUCOSE, CAPILLARY
Glucose-Capillary: 120 mg/dL — ABNORMAL HIGH (ref 70–99)
Glucose-Capillary: 112 mg/dL — ABNORMAL HIGH (ref 70–99)
Glucose-Capillary: 118 mg/dL — ABNORMAL HIGH (ref 70–99)
Glucose-Capillary: 106 mg/dL — ABNORMAL HIGH (ref 70–99)

## 2023-08-15 LAB — MAGNESIUM: Magnesium: 2.1 mg/dL (ref 1.7–2.4)

## 2023-08-15 LAB — PHOSPHORUS: Phosphorus: 2.4 mg/dL — ABNORMAL LOW (ref 2.5–4.6)

## 2023-08-15 MED ORDER — POTASSIUM & SODIUM PHOSPHATES 280-160-250 MG PO PACK
1.0000 | PACK | Freq: Three times a day (TID) | ORAL | Status: AC
  Administered 2023-08-15 – 2023-08-16 (×4): 1 via ORAL
  Filled 2023-08-15 (×4): qty 1

## 2023-08-15 MED ORDER — SODIUM CHLORIDE 1 G PO TABS
1.0000 g | ORAL_TABLET | Freq: Two times a day (BID) | ORAL | Status: DC
  Administered 2023-08-15 – 2023-08-16 (×3): 1 g via ORAL
  Filled 2023-08-15 (×3): qty 1

## 2023-08-15 MED ORDER — LOPERAMIDE HCL 2 MG PO CAPS: 2.0000 mg | ORAL_CAPSULE | ORAL | Status: DC | PRN

## 2023-08-15 NOTE — Plan of Care (Signed)
  Problem: Education: Goal: Ability to describe self-care measures that may prevent or decrease complications (Diabetes Survival Skills Education) will improve Outcome: Progressing   Problem: Coping: Goal: Ability to adjust to condition or change in health will improve Outcome: Progressing   Problem: Health Behavior/Discharge Planning: Goal: Ability to manage health-related needs will improve Outcome: Progressing   

## 2023-08-15 NOTE — Progress Notes (Signed)
  Progress Note   Patient: Hayley Knight ZOX:[id number] DOB: [birth year] DOA: 08/13/2023     2 DOS: the patient was seen and examined on 08/15/2023   Brief hospital course: TANASHIA CIESLA is a 88 y.o. female with medical history significant of IIDM, morbid obesity, HTN, OSA, chronic hyponatremia presented with persistent diarrhea and worsening of general weakness.  Patient was found to have hyponatremia, hypokalemia.    Active Problems:   CAD (coronary artery disease)   Benign essential hypertension   Class 2 obesity   Hyponatremia   Diarrhea due to drug   Diarrhea   Hypokalemia   Hypophosphatemia   Assessment and Plan: Diarrhea secondary to medication effect. Hyponatremia, multifactorial. Hypokalemia secondary to diarrhea. Chronic kidney disease stage II. Hypophosphatemia. Had a worsening hyponatremia with significant weakness.  This is secondary to diarrhea, polydipsia, and hydrochlorothiazide . Condition is improving.  Continue fluid restriction.  Potassium normalized. Patient still has significant diarrhea, does not feel safe to go home.  Will start lower dose Imodium  as patient has no concern for infectious course of the diarrhea. Diarrhea much better today after treated with Imodium  and Creon .  Slightly low phosphate, repleted orally.  Start sodium chloride  1 g twice a day, sodium level still 128.   Reactive thrombocytosis. Secondary to diarrhea.    Type 2 diabetes Continue sliding scale insulin    Essential hypertension. Will discontinue HCTZ. She still feels some dizziness, hold off bp medicines.   Class II diabetes with BMI 38.02 with comorbidities. Obstructive sleep apnea. Continue to follow.  Follow-up with PCP.        Subjective:  Is better, still complaining of weakness, does not want to go home today.  But refused nursing placement.  Physical Exam: Vitals:   08/14/23 0818 08/14/23 1624 08/14/23 1945 08/15/23 0734  BP: (!) 121/51 114/62 (!) 138/55  (!) 114/55  Pulse: 63 64 68 68  Resp:  18 18 16   Temp:  98.5 F (36.9 C) 98.7 F (37.1 C)   TempSrc:   Oral   SpO2: 97% 96% 96% 95%  Weight:      Height:       General exam: Appears calm and comfortable  Respiratory system: Clear to auscultation. Respiratory effort normal. Cardiovascular system: S1 & S2 heard, RRR. No JVD, murmurs, rubs, gallops or clicks. No pedal edema. Gastrointestinal system: Abdomen is nondistended, soft and nontender. No organomegaly or masses felt. Normal bowel sounds heard. Central nervous system: Alert and oriented. No focal neurological deficits. Extremities: Symmetric 5 x 5 power. Skin: No rashes, lesions or ulcers Psychiatry: Judgement and insight appear normal. Mood & affect appropriate.    Data Reviewed:  Lab results reviewed.  Family Communication: None  Disposition: Status is: Inpatient Remains inpatient appropriate because: Severity of disease.     Time spent: 35 minutes  Author: Donaciano Frizzle, MD 08/15/2023 12:57 PM  For on call review [email or web address].

## 2023-08-15 NOTE — Plan of Care (Signed)
  Problem: Metabolic: Goal: Ability to maintain appropriate glucose levels will improve Outcome: Progressing   Problem: Skin Integrity: Goal: Risk for impaired skin integrity will decrease Outcome: Progressing   Problem: Tissue Perfusion: Goal: Adequacy of tissue perfusion will improve Outcome: Progressing   Problem: Activity: Goal: Risk for activity intolerance will decrease Outcome: Progressing   Problem: Pain Managment: Goal: General experience of comfort will improve and/or be controlled Outcome: Progressing   Problem: Safety: Goal: Ability to remain free from injury will improve Outcome: Progressing   Problem: Skin Integrity: Goal: Risk for impaired skin integrity will decrease Outcome: Progressing   Problem: Nutritional: Goal: Maintenance of adequate nutrition will improve Outcome: Not Progressing  Poor PO intake; no appetite per pt Problem: Nutrition: Goal: Adequate nutrition will be maintained Outcome: Not Progressing   Problem: Education: Goal: Ability to describe self-care measures that may prevent or decrease complications (Diabetes Survival Skills Education) will improve Outcome: Not Applicable Goal: Individualized Educational Video(s) Outcome: Not Applicable

## 2023-08-16 DIAGNOSIS — K521 Toxic gastroenteritis and colitis: Secondary | ICD-10-CM | POA: Diagnosis not present

## 2023-08-16 DIAGNOSIS — M542 Cervicalgia: Secondary | ICD-10-CM | POA: Diagnosis not present

## 2023-08-16 DIAGNOSIS — E876 Hypokalemia: Secondary | ICD-10-CM | POA: Diagnosis not present

## 2023-08-16 LAB — BASIC METABOLIC PANEL WITH GFR
Anion gap: 8 (ref 5–15)
BUN: 13 mg/dL (ref 8–23)
CO2: 26 mmol/L (ref 22–32)
Calcium: 8.9 mg/dL (ref 8.9–10.3)
Chloride: 98 mmol/L (ref 98–111)
Creatinine, Ser: 0.75 mg/dL (ref 0.44–1.00)
GFR, Estimated: >60 mL/min (ref >60)
Glucose, Bld: 104 mg/dL — ABNORMAL HIGH (ref 70–99)
Potassium: 4.4 mmol/L (ref 3.5–5.1)
Sodium: 132 mmol/L — ABNORMAL LOW (ref 135–145)

## 2023-08-16 LAB — MAGNESIUM: Magnesium: 2 mg/dL (ref 1.7–2.4)

## 2023-08-16 LAB — GLUCOSE, CAPILLARY
Glucose-Capillary: 100 mg/dL — ABNORMAL HIGH (ref 70–99)
Glucose-Capillary: 100 mg/dL — ABNORMAL HIGH (ref 70–99)

## 2023-08-16 LAB — PHOSPHORUS: Phosphorus: 3.2 mg/dL (ref 2.5–4.6)

## 2023-08-16 MED ORDER — PANTOPRAZOLE SODIUM 40 MG PO TBEC: 40.0000 mg | DELAYED_RELEASE_TABLET | Freq: Every day | ORAL | 0 refills | Status: DC

## 2023-08-16 MED ORDER — PANCRELIPASE (LIP-PROT-AMYL) 24000-76000 UNITS PO CPEP: 24000.0000 [IU] | ORAL_CAPSULE | Freq: Three times a day (TID) | ORAL | 0 refills | Status: AC

## 2023-08-16 NOTE — TOC Transition Note (Signed)
 Transition of Care [facility]) - Discharge Note   Patient Details  Name: Hayley Knight MRN: [id number] Date of Birth: [birth year]  [facility] of [facility]) CM/SW Contact:  Elsie Halo, RN Phone Number: 08/16/2023, 11:32 AM   Clinical Narrative:    Patient is medically clear to dc to home. TOC offered patient choice for [facility] PT and patient advised that she has been active with [facility] for over 10 years and does not want any other services. Patient advised that she called House calls this am to let them know she is returning home. No other TOC needs identified.         Patient Goals and CMS Choice            Discharge Placement                       Discharge Plan and Services Additional resources added to the After Visit Summary for                                       Social Drivers of Health (SDOH) Interventions SDOH Screenings   Food Insecurity: No Food Insecurity (08/13/2023)  Housing: Low Risk  (08/13/2023)  Transportation Needs: No Transportation Needs (08/13/2023)  Utilities: Not At Risk (08/13/2023)  Depression (PHQ2-9): Low Risk  (03/08/2023)  Social Connections: Moderately Isolated (08/13/2023)  Tobacco Use: Low Risk  (08/13/2023)     Readmission Risk Interventions    07/05/2022   11:46 AM  Readmission Risk Prevention Plan  Transportation Screening Complete  PCP or Specialist Appt within 3-5 Days Complete

## 2023-08-16 NOTE — Plan of Care (Signed)
 Tamea Faith, LPN

## 2023-08-16 NOTE — Care Management Important Message (Signed)
 Important Message  Patient Details  Name: Hayley Knight MRN: [id number] Date of Birth: 06/03/1935   Important Message Given:  Yes - Medicare IM     Moyses Pavey W, CMA 08/16/2023, 10:33 AM

## 2023-08-16 NOTE — Progress Notes (Signed)
 Physical Therapy Treatment Patient Details Name: Hayley Knight MRN: [id number] DOB: 1936/03/02 Today's Date: 08/16/2023   History of Present Illness presented to ER secondary to diarrhea, weakness (recently started Mounjaro ); admitted for management of acute/chronic hyponatremia    PT Comments  Pt is making good progress towards goals with ability to ambulate in room several laps. 1 LOB noted with cues for correction. No AD used and pt reports she is feeling better and hopeful to dc home this date. Assisted with urgent tolieting need with pt able to perform self hygiene. Will continue to progress as able.   If plan is discharge home, recommend the following: A little help with walking and/or transfers;A little help with bathing/dressing/bathroom   Can travel by private vehicle        Equipment Recommendations  None recommended by PT    Recommendations for Other Services       Precautions / Restrictions Precautions Precautions: Fall Recall of Precautions/Restrictions: Intact Restrictions Weight Bearing Restrictions Per Provider Order: No     Mobility  Bed Mobility Overal bed mobility: Modified Independent Bed Mobility: Supine to Sit, Sit to Supine     Supine to sit: Modified independent (Device/Increase time) Sit to supine: Modified independent (Device/Increase time)   General bed mobility comments: safe technique with ease of transition    Transfers Overall transfer level: Needs assistance Equipment used: None Transfers: Sit to/from Stand, Bed to chair/wheelchair/BSC Sit to Stand: Supervision   Step pivot transfers: Supervision       General transfer comment: able to stand and step over to [facility] to void. Safe technique    Ambulation/Gait Ambulation/Gait assistance: Supervision Gait Distance (Feet): 80 Feet Assistive device: None Gait Pattern/deviations: Step-through pattern       General Gait Details: ambulated multiple laps in room without AD. Safe  technique performed. 1 LOB noted with min assist to self correct. Balance improved with additional gait sequencing   Stairs             Wheelchair Mobility     Tilt Bed    Modified Rankin (Stroke Patients Only)       Balance Overall balance assessment: Needs assistance Sitting-balance support: No upper extremity supported, Feet supported Sitting balance-Leahy Scale: Good     Standing balance support: During functional activity Standing balance-Leahy Scale: Fair                              Hotel manager: No apparent difficulties  Cognition Arousal: Alert Behavior During Therapy: WFL for tasks assessed/performed   PT - Cognitive impairments: No apparent impairments                       PT - Cognition Comments: pleasant and agreeable to session Following commands: Intact      Cueing Cueing Techniques: Verbal cues  Exercises Other Exercises Other Exercises: ambulated over to toilet to urinate. Pt able to perform peri care with supervision, needs CGA to don brief.    General Comments        Pertinent Vitals/Pain Pain Assessment Pain Assessment: No/denies pain    Home Living                          Prior Function            PT Goals (current goals can now be found in the care plan section) Acute Rehab  PT Goals Patient Stated Goal: to return home PT Goal Formulation: With patient Time For Goal Achievement: 08/28/23 Potential to Achieve Goals: Good Progress towards PT goals: Progressing toward goals    Frequency    Min 2X/week      PT Plan      Co-evaluation              AM-PAC PT "6 Clicks" Mobility   Outcome Measure  Help needed turning from your back to your side while in a flat bed without using bedrails?: None Help needed moving from lying on your back to sitting on the side of a flat bed without using bedrails?: None Help needed moving to and from a bed to a chair  (including a wheelchair)?: A Little Help needed standing up from a chair using your arms (e.g., wheelchair or bedside chair)?: A Little Help needed to walk in hospital room?: A Little Help needed climbing 3-5 steps with a railing? : A Little 6 Click Score: 20    End of Session   Activity Tolerance: Patient tolerated treatment well Patient left: in bed;with bed alarm set Nurse Communication: Mobility status PT Visit Diagnosis: Muscle weakness (generalized) (M62.81);Difficulty in walking, not elsewhere classified (R26.2)     Time: 1914-7829 PT Time Calculation (min) (ACUTE ONLY): 16 min  Charges:    $Gait Training: 8-22 mins PT General Charges $$ ACUTE PT VISIT: 1 Visit                     Amparo Balk, PT, DPT, GCS [phone]    Susi Goslin 08/16/2023, 9:40 AM

## 2023-08-16 NOTE — Discharge Summary (Signed)
 Physician Discharge Summary   Patient: Hayley Knight MRN: [id number] DOB: 1936/02/07  Admit date:     08/13/2023  Discharge date: 08/16/23  Discharge Physician: Donaciano Frizzle   PCP: Alica Antu, NP   Recommendations at discharge:   PCP in 1 week  Discharge Diagnoses: Active Problems:   CAD (coronary artery disease)   Benign essential hypertension   Class 2 obesity   Hyponatremia   Diarrhea due to drug   Diarrhea   Hypokalemia   Hypophosphatemia  Resolved Problems:   * No resolved hospital problems. *  Hospital Course: Hayley Knight is a 88 y.o. female with medical history significant of IIDM, morbid obesity, HTN, OSA, chronic hyponatremia presented with persistent diarrhea and worsening of general weakness.  Patient was found to have hyponatremia, hypokalemia.  Found to have improved, diarrhea resolved.  Medically stable for discharge.  Assessment and Plan: Diarrhea secondary to medication effect. Hyponatremia, multifactorial. Hypokalemia secondary to diarrhea. Chronic kidney disease stage II. Hypophosphatemia. Had a worsening hyponatremia with significant weakness.  This is secondary to diarrhea, polydipsia, and hydrochlorothiazide . Condition is improving.  Continue fluid restriction.  Potassium normalized. Patient still has significant diarrhea, does not feel safe to go home.  Will start lower dose Imodium  as patient has no concern for infectious course of the diarrhea. 4/20.Diarrhea much better today after treated with Imodium  and Creon .  Slightly low phosphate, repleted orally.  Start sodium chloride  1 g twice a day, sodium level still 128. 4/21.  Condition improved, no additional diarrhea.  Sodium level went up to 132, continue hold of diuretics.  Medically stable for discharge.   Reactive thrombocytosis. Secondary to diarrhea.    Type 2 diabetes Resume home treatment.   Essential hypertension. Pressure running high, resume home treatment.  But hold off  on diuretics.   Class II obesity with BMI 38.02 with comorbidities. Obstructive sleep apnea. Follow-up with PCP.            Consultants: None Procedures performed: None  Disposition: Home Diet recommendation:  Discharge Diet Orders (From admission, onward)     Start     Ordered   08/16/23 0000  Diet - low sodium heart healthy        08/16/23 0908           Cardiac diet DISCHARGE MEDICATION: Allergies as of 08/16/2023       Reactions   Iodine Swelling   Ivp Dye [iodinated Contrast Media] Swelling   Sulfa Antibiotics Swelling        Medication List     STOP taking these medications    furosemide  40 MG tablet Commonly known as: LASIX    hydrochlorothiazide  12.5 MG tablet Commonly known as: HYDRODIURIL    Melatonin 10 MG Tabs   omeprazole  40 MG capsule Commonly known as: PRILOSEC Replaced by: pantoprazole  40 MG tablet   potassium chloride  10 MEQ tablet Commonly known as: KLOR-CON        TAKE these medications    albuterol  108 (90 Base) MCG/ACT inhaler Commonly known as: VENTOLIN  HFA Inhale 2 puffs into the lungs every 6 (six) hours as needed for wheezing or shortness of breath.   amLODipine  2.5 MG tablet Commonly known as: NORVASC  TAKE 1 TABLET BY MOUTH DAILY FOR ELEVATED BLOOD PRESSURE   aspirin  EC 81 MG tablet Take 81 mg by mouth at bedtime.   clopidogrel  75 MG tablet Commonly known as: PLAVIX  TAKE 1 TABLET BY MOUTH EVERY DAY   diazepam  5 MG tablet Commonly known as: VALIUM  Take  5 mg by mouth every 12 (twelve) hours as needed for anxiety.   escitalopram  20 MG tablet Commonly known as: LEXAPRO  TAKE 1 TABLET BY MOUTH EVERY DAY   fenofibrate  145 MG tablet Commonly known as: TRICOR  Take 1 tablet (145 mg total) by mouth at bedtime.   fluticasone  50 MCG/ACT nasal spray Commonly known as: FLONASE  Place 1 spray into both nostrils daily.   gabapentin  300 MG capsule Commonly known as: NEURONTIN  Take 300 mg by mouth 2 (two) times daily  as needed (Neck pain).   levocetirizine 5 MG tablet Commonly known as: Xyzal  Allergy 24HR Take 1 tablet (5 mg total) by mouth every evening.   losartan  100 MG tablet Commonly known as: COZAAR  TAKE 1 TABLET BY MOUTH EVERY DAY   Mounjaro  2.5 MG/0.5ML Pen Generic drug: tirzepatide  Inject 2.5 mg into the skin once a week.   multivitamin capsule Take 2 capsules by mouth daily.   nitroGLYCERIN  0.4 MG SL tablet Commonly known as: NITROSTAT  Place 0.4 mg under the tongue every 5 (five) minutes as needed for chest pain.   omega-3 acid ethyl esters 1 g capsule Commonly known as: LOVAZA  TAKE 2 CAPSULES BY MOUTH 2 TIMES DAILY.   oxyCODONE  10 mg 12 hr tablet Commonly known as: OXYCONTIN  Take 20 mg by mouth every 12 (twelve) hours.   Pancrelipase  (Lip-Prot-Amyl) 24000-76000 units Cpep Take 1 capsule (24,000 Units total) by mouth 3 (three) times daily before meals for 14 days.   pantoprazole  40 MG tablet Commonly known as: PROTONIX  Take 1 tablet (40 mg total) by mouth daily. Start taking on: August 17, 2023 Replaces: omeprazole  40 MG capsule   predniSONE  5 MG tablet Commonly known as: DELTASONE  Take 1 tablet (5 mg total) by mouth 2 (two) times daily with a meal.   sitaGLIPtin  100 MG tablet Commonly known as: JANUVIA  Take 1 tablet (100 mg total) by mouth daily.   Vitamin D3 125 MCG (5000 UT) Caps Take 1 capsule (5,000 Units total) by mouth daily.        Follow-up Information     Scoggins, Amber, NP Follow up in 1 week(s).   Specialty: Cardiology Contact information: [address] [place] [address] [phone]                Discharge Exam: Hayley Knight Hayley Knight   08/13/23 1824  Weight: 88.3 kg   General exam: Appears calm and comfortable  Respiratory system: Clear to auscultation. Respiratory effort normal. Cardiovascular system: S1 & S2 heard, RRR. No JVD, murmurs, rubs, gallops or clicks. No pedal edema. Gastrointestinal system: Abdomen is nondistended,  soft and nontender. No organomegaly or masses felt. Normal bowel sounds heard. Central nervous system: Alert and oriented. No focal neurological deficits. Extremities: Symmetric 5 x 5 power. Skin: No rashes, lesions or ulcers Psychiatry: Judgement and insight appear normal. Mood & affect appropriate.    Condition at discharge: good  The results of significant diagnostics from this hospitalization (including imaging, microbiology, ancillary and laboratory) are listed below for reference.   Imaging Studies: No results found.  Microbiology: Results for orders placed or performed during the hospital encounter of 08/13/23  Gastrointestinal Panel by PCR , Stool     Status: None   Collection Time: 08/13/23 10:56 PM   Specimen: Stool  Result Value Ref Range Status   Campylobacter species NOT DETECTED NOT DETECTED Final   Plesimonas shigelloides NOT DETECTED NOT DETECTED Final   Salmonella species NOT DETECTED NOT DETECTED Final   Yersinia enterocolitica NOT DETECTED NOT DETECTED  Final   Vibrio species NOT DETECTED NOT DETECTED Final   Vibrio cholerae NOT DETECTED NOT DETECTED Final   Enteroaggregative E coli (EAEC) NOT DETECTED NOT DETECTED Final   Enteropathogenic E coli (EPEC) NOT DETECTED NOT DETECTED Final   Enterotoxigenic E coli (ETEC) NOT DETECTED NOT DETECTED Final   Shiga like toxin producing E coli (STEC) NOT DETECTED NOT DETECTED Final   Shigella/Enteroinvasive E coli (EIEC) NOT DETECTED NOT DETECTED Final   Cryptosporidium NOT DETECTED NOT DETECTED Final   Cyclospora cayetanensis NOT DETECTED NOT DETECTED Final   Entamoeba histolytica NOT DETECTED NOT DETECTED Final   Giardia lamblia NOT DETECTED NOT DETECTED Final   Adenovirus F40/41 NOT DETECTED NOT DETECTED Final   Astrovirus NOT DETECTED NOT DETECTED Final   Norovirus GI/GII NOT DETECTED NOT DETECTED Final   Rotavirus A NOT DETECTED NOT DETECTED Final   Sapovirus (I, II, IV, and V) NOT DETECTED NOT DETECTED Final     Comment: Performed at [facility], [address] [place], [place] [address]  C Difficile Quick Screen w PCR reflex     Status: None   Collection Time: 08/14/23  4:59 PM   Specimen: STOOL  Result Value Ref Range Status   C Diff antigen NEGATIVE NEGATIVE Final   C Diff toxin NEGATIVE NEGATIVE Final   C Diff interpretation No C. difficile detected.  Final    Comment: Performed at [facility], [address] [place], [place] [address]    Labs: CBC: Recent Labs  Lab 08/13/23 1430  WBC 6.2  HGB 11.1*  HCT 32.3*  MCV 86.6  PLT 411*   Basic Metabolic Panel: Recent Labs  Lab 08/13/23 1430 08/13/23 1952 08/14/23 0458 08/15/23 0512 08/16/23 0502  NA 119* 125* 128* 128* 132*  K 3.1*  --  4.1 4.1 4.4  CL 83*  --  90* 96* 98  CO2 25  --  28 24 26   GLUCOSE 99  --  99 116* 104*  BUN 14  --  13 11 13   CREATININE 1.07*  --  0.86 0.84 0.75  CALCIUM  9.1  --  9.4 8.9 8.9  MG 2.4  --   --  2.1 2.0  PHOS 2.9  --   --  2.4* 3.2   Liver Function Tests: Recent Labs  Lab 08/13/23 1430  AST 29  ALT 15  ALKPHOS 41  BILITOT 0.9  PROT 6.9  ALBUMIN 4.1   CBG: Recent Labs  Lab 08/15/23 0819 08/15/23 1152 08/15/23 1639 08/15/23 2118 08/16/23 0722  GLUCAP 120* 112* 118* 106* 100*    Discharge time spent: greater than 30 minutes.  Signed: Donaciano Frizzle, MD [facility] 08/16/2023

## 2023-08-17 ENCOUNTER — Telehealth: Payer: Self-pay

## 2023-08-17 NOTE — Transitions of Care (Post Inpatient/ED Visit) (Signed)
 08/17/2023  Name: Hayley Knight MRN: [id number] DOB: [birth year]  Today's TOC FU Call Status: Today's TOC FU Call Status:: Successful TOC FU Call Completed TOC FU Call Complete Date: 08/17/23 Patient's Name and Date of Birth confirmed.  Transition Care Management Follow-up Telephone Call Date of Discharge: 08/16/23 Discharge Facility: [facility]) Type of Discharge: Inpatient Admission Primary Inpatient Discharge Diagnosis:: Diarrhea How have you been since you were released from the hospital?: Better Any questions or concerns?: No  Items Reviewed: Did you receive and understand the discharge instructions provided?: Yes Medications obtained,verified, and reconciled?: Yes (Medications Reviewed) Any new allergies since your discharge?: No Dietary orders reviewed?: Yes Type of Diet Ordered:: Low sodium Heart Healthy Do you have support at home?: Yes People in Home [RPT]: child(ren), adult Name of Support/Comfort Primary Source: Lindsey Rhodes  Medications Reviewed Today: Medications Reviewed Today     Reviewed by Claudene Crystal, RN (Case Manager) on 08/17/23 at 1323  Med List Status: <None>   Medication Order Taking? Sig Documenting Provider Last Dose Status Informant  albuterol  (VENTOLIN  HFA) 108 (90 Base) MCG/ACT inhaler [id number]  Inhale 2 puffs into the lungs every 6 (six) hours as needed for wheezing or shortness of breath. Alica Antu, NP  Active Self, Pharmacy Records           Med Note Lexine Redder, MARIA   Fri Aug 13, 2023  4:44 PM) prn  amLODipine  (NORVASC ) 2.5 MG tablet [id number]  TAKE 1 TABLET BY MOUTH DAILY FOR ELEVATED BLOOD PRESSURE Trenda Frisk, FNP  Active Self, Pharmacy Records  aspirin  81 MG EC tablet [id number]  Take 81 mg by mouth at bedtime. [provider]  Active Self, Pharmacy Records  Cholecalciferol  (VITAMIN D3) 125 MCG (5000 UT) CAPS [id number]  Take 1 capsule (5,000 Units total) by mouth daily. Ascencion Lava, DMD  Active Pharmacy Records, Self  clopidogrel  (PLAVIX ) 75 MG tablet [id number]  TAKE 1 TABLET BY MOUTH EVERY DAY Boswell, Chelsa, NP  Active Self, Pharmacy Records  diazepam  (VALIUM ) 5 MG tablet [id number]  Take 5 mg by mouth every 12 (twelve) hours as needed for anxiety. [provider]  Active Self, Pharmacy Records  escitalopram  (LEXAPRO ) 20 MG tablet [id number]  TAKE 1 TABLET BY MOUTH EVERY DAY Boswell, Chelsa, NP  Active Self, Pharmacy Records  fenofibrate  (TRICOR ) 145 MG tablet [id number]  Take 1 tablet (145 mg total) by mouth at bedtime. Scoggins, Amber, NP  Active Self, Pharmacy Records  fluticasone  (FLONASE ) 50 MCG/ACT nasal spray [id number]  Place 1 spray into both nostrils daily. Scoggins, Amber, NP  Active Self, Pharmacy Records  gabapentin  (NEURONTIN ) 300 MG capsule [id number]  Take 300 mg by mouth 2 (two) times daily as needed (Neck pain). [provider]  Active Self, Pharmacy Records  levocetirizine (XYZAL  ALLERGY 24HR) 5 MG tablet [id number]  Take 1 tablet (5 mg total) by mouth every evening. Scoggins, Amber, NP  Active Self, Pharmacy Records  lipase/protease/amylase 24000-76000 units CPEP [id number]  Take 1 capsule (24,000 Units total) by mouth 3 (three) times daily before meals for 14 days. Donaciano Frizzle, MD  Active   losartan  (COZAAR ) 100 MG tablet [id number]  TAKE 1 TABLET BY MOUTH EVERY DAY Scoggins, Amber, NP  Active Self, Pharmacy Records  Multiple Vitamin (MULTIVITAMIN) capsule [id number]  Take 2 capsules by mouth daily. [provider]  Active Self, Pharmacy Records  nitroGLYCERIN  (NITROSTAT ) 0.4 MG SL tablet [id number]  Place 0.4 mg under the tongue every 5 (five) minutes as needed  for chest pain. [provider]  Expired 08/13/23 2359 Self, Pharmacy Records           Med Note Lexine Redder, MARIA   Fri Aug 13, 2023  4:44 PM) prn  omega-3 acid ethyl esters (LOVAZA ) 1 g capsule [id number]  TAKE 2 CAPSULES BY MOUTH 2 TIMES DAILY. Scoggins, Amber, NP   Active Self, Pharmacy Records  oxyCODONE  (OXYCONTIN ) 10 mg 12 hr tablet [id number]  Take 20 mg by mouth every 12 (twelve) hours. [provider]  Active Self, Pharmacy Records  pantoprazole  (PROTONIX ) 40 MG tablet [id number]  Take 1 tablet (40 mg total) by mouth daily. Donaciano Frizzle, MD  Active   predniSONE  (DELTASONE ) 5 MG tablet [id number]  Take 1 tablet (5 mg total) by mouth 2 (two) times daily with a meal. Scoggins, Amber, NP  Active Self, Pharmacy Records  sitaGLIPtin  (JANUVIA ) 100 MG tablet [id number]  Take 1 tablet (100 mg total) by mouth daily. Aisha Hove, MD  Active Self, Pharmacy Records  tirzepatide  (MOUNJARO ) 2.5 MG/0.5ML Pen [id number] No Inject 2.5 mg into the skin once a week.  Patient not taking: Reported on 08/17/2023   Alica Antu, NP Not Taking Active Self, Pharmacy Records            Home Care and Equipment/Supplies: Were Home Health Services Ordered?: NA Any new equipment or medical supplies ordered?: NA  Functional Questionnaire: Do you need assistance with bathing/showering or dressing?: Yes Do you need assistance with eating?: No Do you have difficulty maintaining continence: No Do you need assistance with getting out of bed/getting out of a chair/moving?: No Do you have difficulty managing or taking your medications?: No  Follow up appointments reviewed: PCP Follow-up appointment confirmed?: No (The patient will schedule) MD Provider Line Number:[phone] Given: No Specialist Hospital Follow-up appointment confirmed?: NA Do you need transportation to your follow-up appointment?: No Do you understand care options if your condition(s) worsen?: Yes-patient verbalized understanding  SDOH Interventions Today    Flowsheet Row Most Recent Value  SDOH Interventions   Food Insecurity Interventions Intervention Not Indicated  Housing Interventions Intervention Not Indicated  Transportation Interventions Intervention Not Indicated  Utilities  Interventions Intervention Not Indicated      Gareld June, BSN, RN [place]  [facility] - Population Health RN Care Manager [phone]

## 2023-08-18 ENCOUNTER — Other Ambulatory Visit: Payer: Self-pay | Admitting: Cardiology

## 2023-08-18 DIAGNOSIS — J069 Acute upper respiratory infection, unspecified: Secondary | ICD-10-CM

## 2023-08-24 ENCOUNTER — Telehealth: Payer: Self-pay

## 2023-08-24 NOTE — Telephone Encounter (Signed)
 Patient called asking if there was a pill that can be prescribed to help curb her appetite since she can not take the weightloss injections

## 2023-09-08 DIAGNOSIS — Z79891 Long term (current) use of opiate analgesic: Secondary | ICD-10-CM | POA: Diagnosis not present

## 2023-09-08 DIAGNOSIS — G890 Central pain syndrome: Secondary | ICD-10-CM | POA: Diagnosis not present

## 2023-09-08 DIAGNOSIS — M542 Cervicalgia: Secondary | ICD-10-CM | POA: Diagnosis not present

## 2023-10-07 ENCOUNTER — Telehealth: Payer: Self-pay

## 2023-10-07 NOTE — Telephone Encounter (Signed)
 We received a fax for OTC medication verifications but we do not have anything listed as OTC in pts chart need to call and find out what she needs us  to put on this form

## 2023-10-08 ENCOUNTER — Telehealth: Payer: Self-pay | Admitting: Cardiology

## 2023-10-08 NOTE — Telephone Encounter (Signed)
 Entered in error

## 2023-10-08 NOTE — Telephone Encounter (Signed)
 Faxed back.

## 2023-10-13 ENCOUNTER — Other Ambulatory Visit

## 2023-10-13 DIAGNOSIS — E785 Hyperlipidemia, unspecified: Secondary | ICD-10-CM

## 2023-10-13 DIAGNOSIS — I10 Essential (primary) hypertension: Secondary | ICD-10-CM | POA: Diagnosis not present

## 2023-10-13 DIAGNOSIS — E1165 Type 2 diabetes mellitus with hyperglycemia: Secondary | ICD-10-CM | POA: Diagnosis not present

## 2023-10-13 DIAGNOSIS — Z1329 Encounter for screening for other suspected endocrine disorder: Secondary | ICD-10-CM | POA: Diagnosis not present

## 2023-10-14 ENCOUNTER — Ambulatory Visit: Payer: Self-pay | Admitting: Cardiology

## 2023-10-14 LAB — CMP14+EGFR
ALT: 8 IU/L (ref 0–32)
AST: 20 IU/L (ref 0–40)
Albumin: 4 g/dL (ref 3.7–4.7)
Alkaline Phosphatase: 62 IU/L (ref 44–121)
BUN/Creatinine Ratio: 17 (ref 12–28)
BUN: 19 mg/dL (ref 8–27)
Bilirubin Total: 0.4 mg/dL (ref 0.0–1.2)
CO2: 22 mmol/L (ref 20–29)
Calcium: 9.2 mg/dL (ref 8.7–10.3)
Chloride: 98 mmol/L (ref 96–106)
Creatinine, Ser: 1.11 mg/dL — ABNORMAL HIGH (ref 0.57–1.00)
Globulin, Total: 2.1 g/dL (ref 1.5–4.5)
Glucose: 96 mg/dL (ref 70–99)
Potassium: 3.8 mmol/L (ref 3.5–5.2)
Sodium: 140 mmol/L (ref 134–144)
Total Protein: 6.1 g/dL (ref 6.0–8.5)
eGFR: 48 mL/min/{1.73_m2} — ABNORMAL LOW (ref >59)

## 2023-10-14 LAB — LIPID PANEL
Chol/HDL Ratio: 4.5 ratio — ABNORMAL HIGH (ref 0.0–4.4)
Cholesterol, Total: 130 mg/dL (ref 100–199)
HDL: 29 mg/dL — ABNORMAL LOW (ref >39)
LDL Chol Calc (NIH): 74 mg/dL (ref 0–99)
Triglycerides: 153 mg/dL — ABNORMAL HIGH (ref 0–149)
VLDL Cholesterol Cal: 27 mg/dL (ref 5–40)

## 2023-10-14 LAB — TSH: TSH: 2.1 u[IU]/mL (ref 0.450–4.500)

## 2023-10-14 LAB — HEMOGLOBIN A1C
Est. average glucose Bld gHb Est-mCnc: 111 mg/dL
Hgb A1c MFr Bld: 5.5 % (ref 4.8–5.6)

## 2023-10-15 ENCOUNTER — Encounter: Payer: Self-pay | Admitting: Cardiology

## 2023-10-15 ENCOUNTER — Ambulatory Visit: Payer: Medicare Other | Admitting: Cardiology

## 2023-10-15 VITALS — BP 119/61 | HR 76 | Ht 60.0 in | Wt 188.0 lb

## 2023-10-15 DIAGNOSIS — I10 Essential (primary) hypertension: Secondary | ICD-10-CM | POA: Diagnosis not present

## 2023-10-15 DIAGNOSIS — E1165 Type 2 diabetes mellitus with hyperglycemia: Secondary | ICD-10-CM

## 2023-10-15 DIAGNOSIS — E785 Hyperlipidemia, unspecified: Secondary | ICD-10-CM

## 2023-10-15 MED ORDER — PANCRELIPASE (LIP-PROT-AMYL) 24000-76000 UNITS PO CPEP: 1.0000 | ORAL_CAPSULE | Freq: Three times a day (TID) | ORAL | Status: DC | PRN

## 2023-10-15 MED ORDER — SEMAGLUTIDE(0.25 OR 0.5MG/DOS) 2 MG/3ML ~~LOC~~ SOPN: 3.0000 mL | PEN_INJECTOR | SUBCUTANEOUS | 3 refills | Status: DC

## 2023-10-15 NOTE — Progress Notes (Unsigned)
 Established Patient Office Visit  Subjective:  Patient ID: Hayley Knight, female    DOB: 08-09-1935  Age: 88 y.o. MRN: [id number]  Chief Complaint  Patient presents with   Follow-up    4 month follow up and congestion     Patient in office for 4 month follow up, discuss recent lab work. Patient doing well, no acute complaints.  Discussed recent lab work. Hgb A1c improved, normal. Kidney function decreased, increase water intake.  Patient in the hospital in April 2025 for persistent diarrhea and weakness. Patient feels the diarrhea was caused by Mounjaro . Patient would like to try a different GLP1, will send in Ozempic . Patient understands if she develops diarrhea with Ozempic , to stop immediately and notify office.  Continue all other medications.    No other concerns at this time.   Past Medical History:  Diagnosis Date   Anxiety 05/09/2018   Arthritis    Aspiration pneumonia of left lower lobe (HCC) 07/04/2022   CAP (community acquired pneumonia) 07/02/2022   Cough 10/21/2021   Diabetes mellitus without complication (HCC)    Type II   Dyspnea 07/09/2021   Fatigue due to exposure 06/15/2022   Hypertension    Hyponatremia 10/21/2021   Sleep apnea    No Cpap   SOB (shortness of breath) 10/21/2021   Viral upper respiratory tract infection 06/15/2022    Past Surgical History:  Procedure Laterality Date   ABDOMINAL HYSTERECTOMY     APPENDECTOMY     CARDIAC CATHETERIZATION     CHOLECYSTECTOMY     DILATION AND CURETTAGE OF UTERUS     MASTECTOMY Right    REPLACEMENT TOTAL KNEE BILATERAL     RIGHT/LEFT HEART CATH AND CORONARY ANGIOGRAPHY Bilateral 07/09/2021   Procedure: RIGHT/LEFT HEART CATH AND CORONARY ANGIOGRAPHY;  Surgeon: Florencio Cara BIRCH, MD;  Location: [facility] INVASIVE CV LAB;  Service: Cardiovascular;  Laterality: Bilateral;   TOOTH EXTRACTION N/A 01/02/2022   Procedure: DENTAL RESTORATION/EXTRACTIONS;  Surgeon: Sheryle Hamilton, DMD;  Location: [facility] OR;  Service:  Oral Surgery;  Laterality: N/A;    Social History   Socioeconomic History   Marital status: Widowed    Spouse name: Not on file   Number of children: Not on file   Years of education: Not on file   Highest education level: Not on file  Occupational History   Not on file  Tobacco Use   Smoking status: Never    Passive exposure: Never   Smokeless tobacco: Never  Vaping Use   Vaping status: Never Used  Substance and Sexual Activity   Alcohol use: No   Drug use: No   Sexual activity: Not on file  Other Topics Concern   Not on file  Social History Narrative   Not on file   Social Drivers of Health   Financial Resource Strain: Not on file  Food Insecurity: No Food Insecurity (08/17/2023)   Hunger Vital Sign    Worried About Running Out of Food in the Last Year: Never true    Ran Out of Food in the Last Year: Never true  Transportation Needs: No Transportation Needs (08/17/2023)   PRAPARE - Administrator, Civil Service (Medical): No    Lack of Transportation (Non-Medical): No  Physical Activity: Not on file  Stress: Not on file  Social Connections: Moderately Isolated (08/13/2023)   Social Connection and Isolation Panel    Frequency of Communication with Friends and Family: Three times a week    Frequency of  Social Gatherings with Friends and Family: Three times a week    Attends Religious Services: More than 4 times per year    Active Member of Clubs or Organizations: No    Attends Banker Meetings: Never    Marital Status: Divorced  Catering manager Violence: Not At Risk (08/17/2023)   Humiliation, Afraid, Rape, and Kick questionnaire    Fear of Current or Ex-Partner: No    Emotionally Abused: No    Physically Abused: No    Sexually Abused: No    History reviewed. No pertinent family history.  Allergies  Allergen Reactions   Mounjaro  [Tirzepatide ] Diarrhea   Iodine Swelling   Ivp Dye [Iodinated Contrast Media] Swelling   Sulfa  Antibiotics Swelling    Outpatient Medications Prior to Visit  Medication Sig   albuterol  (VENTOLIN  HFA) 108 (90 Base) MCG/ACT inhaler TAKE 2 PUFFS BY MOUTH EVERY 6 HOURS AS NEEDED FOR WHEEZE OR SHORTNESS OF BREATH   amLODipine  (NORVASC ) 2.5 MG tablet TAKE 1 TABLET BY MOUTH DAILY FOR ELEVATED BLOOD PRESSURE   aspirin  81 MG EC tablet Take 81 mg by mouth at bedtime.   Cholecalciferol  (VITAMIN D3) 125 MCG (5000 UT) CAPS Take 1 capsule (5,000 Units total) by mouth daily.   clopidogrel  (PLAVIX ) 75 MG tablet TAKE 1 TABLET BY MOUTH EVERY DAY   diazepam  (VALIUM ) 5 MG tablet Take 5 mg by mouth every 12 (twelve) hours as needed for anxiety.   escitalopram  (LEXAPRO ) 20 MG tablet TAKE 1 TABLET BY MOUTH EVERY DAY   fenofibrate  (TRICOR ) 145 MG tablet Take 1 tablet (145 mg total) by mouth at bedtime.   fluticasone  (FLONASE ) 50 MCG/ACT nasal spray Place 1 spray into both nostrils daily.   gabapentin  (NEURONTIN ) 300 MG capsule Take 300 mg by mouth 2 (two) times daily as needed (Neck pain).   levocetirizine (XYZAL  ALLERGY 24HR) 5 MG tablet Take 1 tablet (5 mg total) by mouth every evening.   losartan  (COZAAR ) 100 MG tablet TAKE 1 TABLET BY MOUTH EVERY DAY   Multiple Vitamin (MULTIVITAMIN) capsule Take 2 capsules by mouth daily.   nitroGLYCERIN  (NITROSTAT ) 0.4 MG SL tablet Place 0.4 mg under the tongue every 5 (five) minutes as needed for chest pain.   omega-3 acid ethyl esters (LOVAZA ) 1 g capsule TAKE 2 CAPSULES BY MOUTH 2 TIMES DAILY.   oxyCODONE  (OXYCONTIN ) 10 mg 12 hr tablet Take 20 mg by mouth every 12 (twelve) hours.   pantoprazole  (PROTONIX ) 40 MG tablet Take 1 tablet (40 mg total) by mouth daily.   predniSONE  (DELTASONE ) 5 MG tablet Take 1 tablet (5 mg total) by mouth 2 (two) times daily with a meal.   sitaGLIPtin  (JANUVIA ) 100 MG tablet Take 1 tablet (100 mg total) by mouth daily.   [DISCONTINUED] tirzepatide  (MOUNJARO ) 2.5 MG/0.5ML Pen Inject 2.5 mg into the skin once a week.   No  facility-administered medications prior to visit.    Review of Systems  Constitutional: Negative.   HENT: Negative.    Eyes: Negative.   Respiratory: Negative.  Negative for shortness of breath.   Cardiovascular: Negative.  Negative for chest pain.  Gastrointestinal: Negative.  Negative for abdominal pain, constipation and diarrhea.  Genitourinary: Negative.   Musculoskeletal:  Negative for joint pain and myalgias.  Skin: Negative.   Neurological: Negative.  Negative for dizziness and headaches.  Endo/Heme/Allergies: Negative.   All other systems reviewed and are negative.      Objective:   BP 119/61   Pulse 76  Ht 5' (1.524 m)   Wt 188 lb (85.3 kg)   SpO2 95%   BMI 36.72 kg/m   Vitals:   10/15/23 1423  BP: 119/61  Pulse: 76  Height: 5' (1.524 m)  Weight: 188 lb (85.3 kg)  SpO2: 95%  BMI (Calculated): 36.72    Physical Exam Vitals and nursing note reviewed.  Constitutional:      Appearance: Normal appearance. She is normal weight.  HENT:     Head: Normocephalic and atraumatic.     Nose: Nose normal.     Mouth/Throat:     Mouth: Mucous membranes are moist.   Eyes:     Extraocular Movements: Extraocular movements intact.     Conjunctiva/sclera: Conjunctivae normal.     Pupils: Pupils are equal, round, and reactive to light.    Cardiovascular:     Rate and Rhythm: Normal rate and regular rhythm.     Pulses: Normal pulses.     Heart sounds: Normal heart sounds.  Pulmonary:     Effort: Pulmonary effort is normal.     Breath sounds: Normal breath sounds.  Abdominal:     General: Abdomen is flat. Bowel sounds are normal.     Palpations: Abdomen is soft.   Musculoskeletal:        General: Normal range of motion.     Cervical back: Normal range of motion.   Skin:    General: Skin is warm and dry.   Neurological:     General: No focal deficit present.     Mental Status: She is alert and oriented to person, place, and time.   Psychiatric:         Mood and Affect: Mood normal.        Behavior: Behavior normal.        Thought Content: Thought content normal.        Judgment: Judgment normal.      No results found for any visits on 10/15/23.  Recent Results (from the past 2160 hours)  Lipase, blood     Status: None   Collection Time: 08/13/23  2:30 PM  Result Value Ref Range   Lipase 30 11 - 51 U/L    Comment: Performed at [facility], [address] [place], [place] [address]  Comprehensive metabolic panel     Status: Abnormal   Collection Time: 08/13/23  2:30 PM  Result Value Ref Range   Sodium 119 (LL) 135 - 145 mmol/L    Comment: CRITICAL RESULT CALLED TO, READ BACK BY AND VERIFIED WITH JESSICA [id number] 08/13/23 MJU    Potassium 3.1 (L) 3.5 - 5.1 mmol/L   Chloride 83 (L) 98 - 111 mmol/L   CO2 25 22 - 32 mmol/L   Glucose, Bld 99 70 - 99 mg/dL    Comment: Glucose reference range applies only to samples taken after fasting for at least 8 hours.   BUN 14 8 - 23 mg/dL   Creatinine, Ser 8.92 (H) 0.44 - 1.00 mg/dL   Calcium  9.1 8.9 - 10.3 mg/dL   Total Protein 6.9 6.5 - 8.1 g/dL   Albumin 4.1 3.5 - 5.0 g/dL   AST 29 15 - 41 U/L   ALT 15 0 - 44 U/L   Alkaline Phosphatase 41 38 - 126 U/L   Total Bilirubin 0.9 0.0 - 1.2 mg/dL   GFR, Estimated 51 (L) >60 mL/min    Comment: (NOTE) Calculated using the CKD-EPI Creatinine Equation (2021)    Anion gap 11  5 - 15    Comment: Performed at [facility], [address] [place], [place] [address]  CBC     Status: Abnormal   Collection Time: 08/13/23  2:30 PM  Result Value Ref Range   WBC 6.2 4.0 - 10.5 K/uL   RBC 3.73 (L) 3.87 - 5.11 MIL/uL   Hemoglobin 11.1 (L) 12.0 - 15.0 g/dL   HCT 67.6 (L) 63.9 - 53.9 %   MCV 86.6 80.0 - 100.0 fL   MCH 29.8 26.0 - 34.0 pg   MCHC 34.4 30.0 - 36.0 g/dL   RDW 87.1 88.4 - 84.4 %   Platelets 411 (H) 150 - 400 K/uL   nRBC 0.0 0.0 - 0.2 %    Comment: Performed at [facility], [address]  [place], [place] [address]  Osmolality     Status: Abnormal   Collection Time: 08/13/23  2:30 PM  Result Value Ref Range   Osmolality 257 (L) 275 - 295 mOsm/kg    Comment: REPEATED TO VERIFY Performed at [facility], [address] [place], [place] [address]   Magnesium      Status: None   Collection Time: 08/13/23  2:30 PM  Result Value Ref Range   Magnesium  2.4 1.7 - 2.4 mg/dL    Comment: Performed at [facility], [address], [place], [place] [address]  Phosphorus     Status: None   Collection Time: 08/13/23  2:30 PM  Result Value Ref Range   Phosphorus 2.9 2.5 - 4.6 mg/dL    Comment: Performed at [facility], [address] [place], [place] [address]  TSH     Status: None   Collection Time: 08/13/23  2:30 PM  Result Value Ref Range   TSH 1.125 0.350 - 4.500 uIU/mL    Comment: Performed by a 3rd Generation assay with a functional sensitivity of <=0.01 uIU/mL. Performed at [facility], [address] [place], [place] [address]   Glucose, capillary     Status: Abnormal   Collection Time: 08/13/23  6:18 PM  Result Value Ref Range   Glucose-Capillary 101 (H) 70 - 99 mg/dL    Comment: Glucose reference range applies only to samples taken after fasting for at least 8 hours.  Sodium     Status: Abnormal   Collection Time: 08/13/23  7:52 PM  Result Value Ref Range   Sodium 125 (L) 135 - 145 mmol/L    Comment: Performed at [facility], [address] [place], [place] [address]  Glucose, capillary     Status: Abnormal   Collection Time: 08/13/23  8:36 PM  Result Value Ref Range   Glucose-Capillary 103 (H) 70 - 99 mg/dL    Comment: Glucose reference range applies only to samples taken after fasting for at least 8 hours.  Gastrointestinal Panel by PCR , Stool     Status: None   Collection Time: 08/13/23 10:56 PM   Specimen: Stool  Result Value Ref Range   Campylobacter species NOT DETECTED NOT DETECTED   Plesimonas  shigelloides NOT DETECTED NOT DETECTED   Salmonella species NOT DETECTED NOT DETECTED   Yersinia enterocolitica NOT DETECTED NOT DETECTED   Vibrio species NOT DETECTED NOT DETECTED   Vibrio cholerae NOT DETECTED NOT DETECTED   Enteroaggregative E coli (EAEC) NOT DETECTED NOT DETECTED   Enteropathogenic E coli (EPEC) NOT DETECTED NOT DETECTED   Enterotoxigenic E coli (ETEC) NOT DETECTED NOT DETECTED   Shiga like toxin producing  E coli (STEC) NOT DETECTED NOT DETECTED   Shigella/Enteroinvasive E coli (EIEC) NOT DETECTED NOT DETECTED   Cryptosporidium NOT DETECTED NOT DETECTED   Cyclospora cayetanensis NOT DETECTED NOT DETECTED   Entamoeba histolytica NOT DETECTED NOT DETECTED   Giardia lamblia NOT DETECTED NOT DETECTED   Adenovirus F40/41 NOT DETECTED NOT DETECTED   Astrovirus NOT DETECTED NOT DETECTED   Norovirus GI/GII NOT DETECTED NOT DETECTED   Rotavirus A NOT DETECTED NOT DETECTED   Sapovirus (I, II, IV, and V) NOT DETECTED NOT DETECTED    Comment: Performed at [facility], [address], [place], [place] [address]  Basic metabolic panel     Status: Abnormal   Collection Time: 08/14/23  4:58 AM  Result Value Ref Range   Sodium 128 (L) 135 - 145 mmol/L   Potassium 4.1 3.5 - 5.1 mmol/L   Chloride 90 (L) 98 - 111 mmol/L   CO2 28 22 - 32 mmol/L   Glucose, Bld 99 70 - 99 mg/dL    Comment: Glucose reference range applies only to samples taken after fasting for at least 8 hours.   BUN 13 8 - 23 mg/dL   Creatinine, Ser 9.13 0.44 - 1.00 mg/dL   Calcium  9.4 8.9 - 10.3 mg/dL   GFR, Estimated >39 >39 mL/min    Comment: (NOTE) Calculated using the CKD-EPI Creatinine Equation (2021)    Anion gap 10 5 - 15    Comment: Performed at [facility], [address] [place], [place] [address]  Glucose, capillary     Status: Abnormal   Collection Time: 08/14/23  8:24 AM  Result Value Ref Range   Glucose-Capillary 109 (H) 70 - 99 mg/dL    Comment: Glucose reference range  applies only to samples taken after fasting for at least 8 hours.  Glucose, capillary     Status: Abnormal   Collection Time: 08/14/23 12:02 PM  Result Value Ref Range   Glucose-Capillary 107 (H) 70 - 99 mg/dL    Comment: Glucose reference range applies only to samples taken after fasting for at least 8 hours.  Glucose, capillary     Status: Abnormal   Collection Time: 08/14/23  4:27 PM  Result Value Ref Range   Glucose-Capillary 102 (H) 70 - 99 mg/dL    Comment: Glucose reference range applies only to samples taken after fasting for at least 8 hours.  C Difficile Quick Screen w PCR reflex     Status: None   Collection Time: 08/14/23  4:59 PM   Specimen: STOOL  Result Value Ref Range   C Diff antigen NEGATIVE NEGATIVE   C Diff toxin NEGATIVE NEGATIVE   C Diff interpretation No C. difficile detected.     Comment: Performed at [facility], [address] [place], [place] [address]  Glucose, capillary     Status: Abnormal   Collection Time: 08/14/23 10:03 PM  Result Value Ref Range   Glucose-Capillary 111 (H) 70 - 99 mg/dL    Comment: Glucose reference range applies only to samples taken after fasting for at least 8 hours.  Basic metabolic panel with GFR     Status: Abnormal   Collection Time: 08/15/23  5:12 AM  Result Value Ref Range   Sodium 128 (L) 135 - 145 mmol/L   Potassium 4.1 3.5 - 5.1 mmol/L   Chloride 96 (L) 98 - 111 mmol/L   CO2 24 22 - 32 mmol/L   Glucose, Bld 116 (H) 70 - 99 mg/dL  Comment: Glucose reference range applies only to samples taken after fasting for at least 8 hours.   BUN 11 8 - 23 mg/dL   Creatinine, Ser 9.15 0.44 - 1.00 mg/dL   Calcium  8.9 8.9 - 10.3 mg/dL   GFR, Estimated >39 >39 mL/min    Comment: (NOTE) Calculated using the CKD-EPI Creatinine Equation (2021)    Anion gap 8 5 - 15    Comment: Performed at [facility], [address] [place], [place] [address]  Magnesium      Status: None   Collection Time: 08/15/23  5:12  AM  Result Value Ref Range   Magnesium  2.1 1.7 - 2.4 mg/dL    Comment: Performed at [facility], [address], [place], [place] [address]  Phosphorus     Status: Abnormal   Collection Time: 08/15/23  5:12 AM  Result Value Ref Range   Phosphorus 2.4 (L) 2.5 - 4.6 mg/dL    Comment: Performed at [facility], [address] [place], [place] [address]  Glucose, capillary     Status: Abnormal   Collection Time: 08/15/23  8:19 AM  Result Value Ref Range   Glucose-Capillary 120 (H) 70 - 99 mg/dL    Comment: Glucose reference range applies only to samples taken after fasting for at least 8 hours.  Glucose, capillary     Status: Abnormal   Collection Time: 08/15/23 11:52 AM  Result Value Ref Range   Glucose-Capillary 112 (H) 70 - 99 mg/dL    Comment: Glucose reference range applies only to samples taken after fasting for at least 8 hours.  Glucose, capillary     Status: Abnormal   Collection Time: 08/15/23  4:39 PM  Result Value Ref Range   Glucose-Capillary 118 (H) 70 - 99 mg/dL    Comment: Glucose reference range applies only to samples taken after fasting for at least 8 hours.  Glucose, capillary     Status: Abnormal   Collection Time: 08/15/23  9:18 PM  Result Value Ref Range   Glucose-Capillary 106 (H) 70 - 99 mg/dL    Comment: Glucose reference range applies only to samples taken after fasting for at least 8 hours.  Basic metabolic panel with GFR     Status: Abnormal   Collection Time: 08/16/23  5:02 AM  Result Value Ref Range   Sodium 132 (L) 135 - 145 mmol/L   Potassium 4.4 3.5 - 5.1 mmol/L   Chloride 98 98 - 111 mmol/L   CO2 26 22 - 32 mmol/L   Glucose, Bld 104 (H) 70 - 99 mg/dL    Comment: Glucose reference range applies only to samples taken after fasting for at least 8 hours.   BUN 13 8 - 23 mg/dL   Creatinine, Ser 9.24 0.44 - 1.00 mg/dL   Calcium  8.9 8.9 - 10.3 mg/dL   GFR, Estimated >39 >39 mL/min    Comment: (NOTE) Calculated using the CKD-EPI  Creatinine Equation (2021)    Anion gap 8 5 - 15    Comment: Performed at [facility], [address] [place], [place] [address]  Magnesium      Status: None   Collection Time: 08/16/23  5:02 AM  Result Value Ref Range   Magnesium  2.0 1.7 - 2.4 mg/dL    Comment: Performed at [facility], [address] [place], [place] [address]  Phosphorus     Status: None   Collection Time: 08/16/23  5:02 AM  Result Value Ref Range  Phosphorus 3.2 2.5 - 4.6 mg/dL    Comment: Performed at [facility], [address] [place], [place] [address]  Glucose, capillary     Status: Abnormal   Collection Time: 08/16/23  7:22 AM  Result Value Ref Range   Glucose-Capillary 100 (H) 70 - 99 mg/dL    Comment: Glucose reference range applies only to samples taken after fasting for at least 8 hours.  Glucose, capillary     Status: Abnormal   Collection Time: 08/16/23 11:40 AM  Result Value Ref Range   Glucose-Capillary 100 (H) 70 - 99 mg/dL    Comment: Glucose reference range applies only to samples taken after fasting for at least 8 hours.  CMP14+EGFR     Status: Abnormal   Collection Time: 10/13/23  2:33 PM  Result Value Ref Range   Glucose 96 70 - 99 mg/dL   BUN 19 8 - 27 mg/dL   Creatinine, Ser 8.88 (H) 0.57 - 1.00 mg/dL   eGFR 48 (L) >40 fO/fpw/8.26   BUN/Creatinine Ratio 17 12 - 28   Sodium 140 134 - 144 mmol/L   Potassium 3.8 3.5 - 5.2 mmol/L   Chloride 98 96 - 106 mmol/L   CO2 22 20 - 29 mmol/L   Calcium  9.2 8.7 - 10.3 mg/dL   Total Protein 6.1 6.0 - 8.5 g/dL   Albumin 4.0 3.7 - 4.7 g/dL   Globulin, Total 2.1 1.5 - 4.5 g/dL   Bilirubin Total 0.4 0.0 - 1.2 mg/dL   Alkaline Phosphatase 62 44 - 121 IU/L   AST 20 0 - 40 IU/L   ALT 8 0 - 32 IU/L  Lipid Profile     Status: Abnormal   Collection Time: 10/13/23  2:33 PM  Result Value Ref Range   Cholesterol, Total 130 100 - 199 mg/dL   Triglycerides 846 (H) 0 - 149 mg/dL   HDL 29 (L) >60 mg/dL   VLDL Cholesterol  Cal 27 5 - 40 mg/dL   LDL Chol Calc (NIH) 74 0 - 99 mg/dL   Chol/HDL Ratio 4.5 (H) 0.0 - 4.4 ratio    Comment:                                   T. Chol/HDL Ratio                                             Men  Women                               1/2 Avg.Risk  3.4    3.3                                   Avg.Risk  5.0    4.4                                2X Avg.Risk  9.6    7.1                                3X  Avg.Risk 23.4   11.0   Hemoglobin A1c     Status: None   Collection Time: 10/13/23  2:33 PM  Result Value Ref Range   Hgb A1c MFr Bld 5.5 4.8 - 5.6 %    Comment:          Prediabetes: 5.7 - 6.4          Diabetes: >6.4          Glycemic control for adults with diabetes: <7.0    Est. average glucose Bld gHb Est-mCnc 111 mg/dL  TSH     Status: None   Collection Time: 10/13/23  2:33 PM  Result Value Ref Range   TSH 2.100 0.450 - 4.500 uIU/mL      Assessment & Plan:  Change Mounjaro  to Ozempic .  Problem List Items Addressed This Visit       Cardiovascular and Mediastinum   Benign essential hypertension - Primary     Endocrine   Type 2 diabetes mellitus with hyperglycemia (HCC)   Relevant Medications   Semaglutide ,0.25 or 0.5MG /DOS, 2 MG/3ML SOPN     Other   Dyslipidemia    Return in about 4 weeks (around 11/12/2023).   Total time spent: 25 minutes  [employer], NP  10/15/2023   This document may have been prepared by Dragon Voice Recognition software and as such may include unintentional dictation errors.

## 2023-10-18 ENCOUNTER — Other Ambulatory Visit: Payer: Self-pay

## 2023-10-18 IMAGING — US US RENAL
1 series · 14 of 25 positions shown · non-contrast
Comparison: CT scans of the abdomen and pelvis dated January 12, 2008 and March 17, 2015

CLINICAL DATA: Decreased GFR

EXAM:
RENAL / URINARY TRACT ULTRASOUND COMPLETE

[Series 1: us renal · 74 acquisitions, 14 frames shown]
[im 1/74]
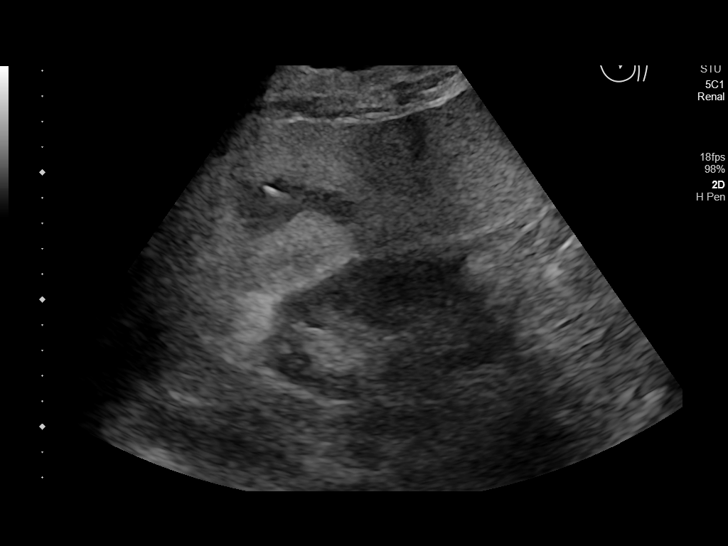
[im 7/74]
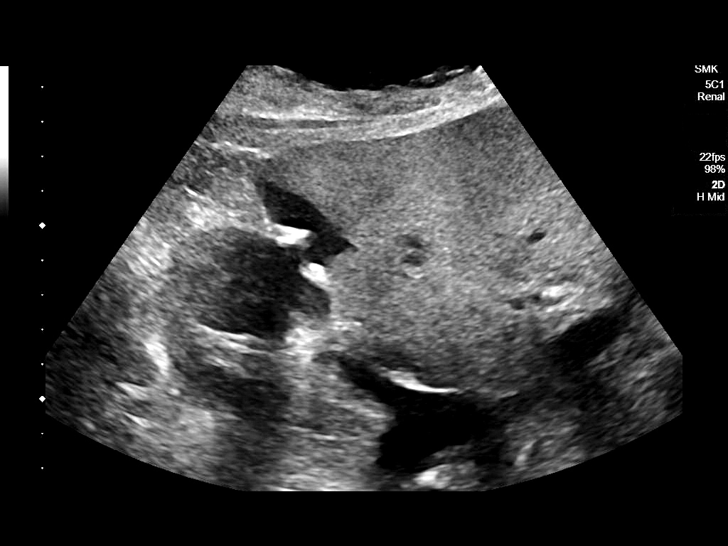
[im 13/74]
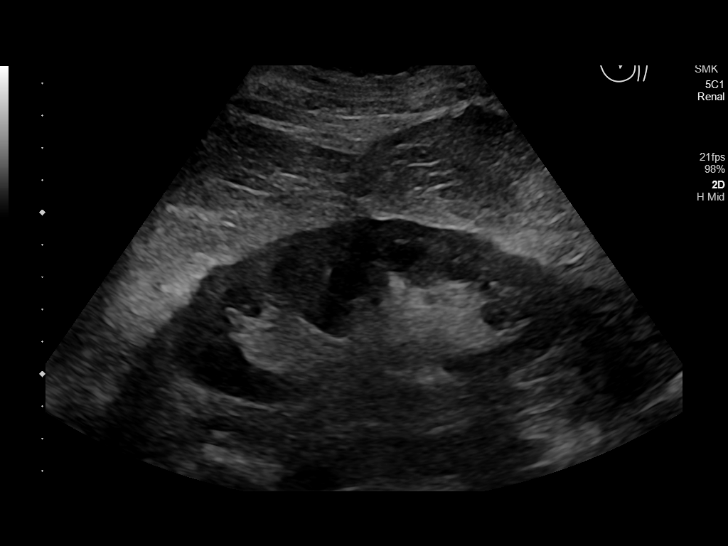
[im 19/74]
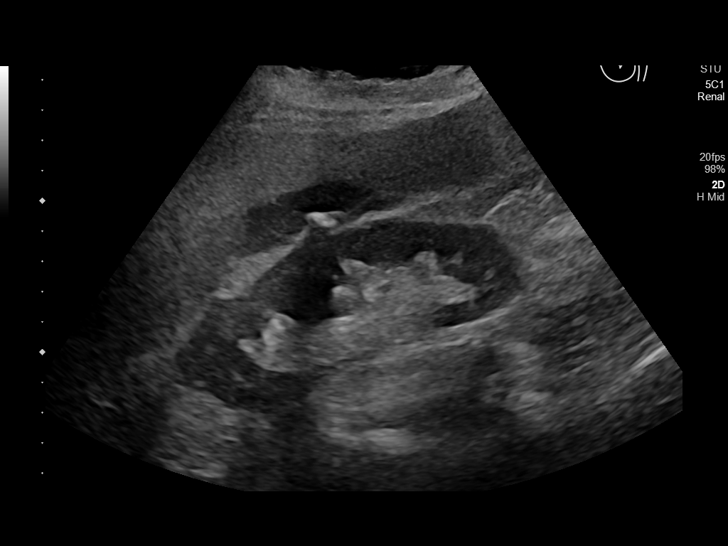
[im 25/74]
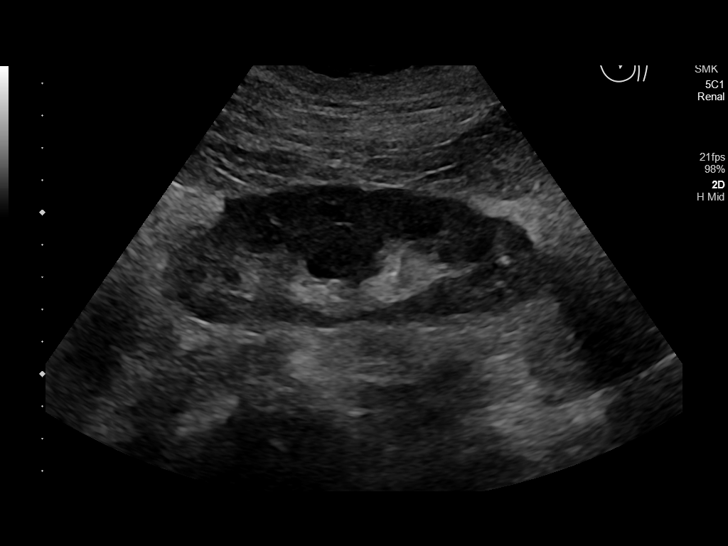
[im 28/74]
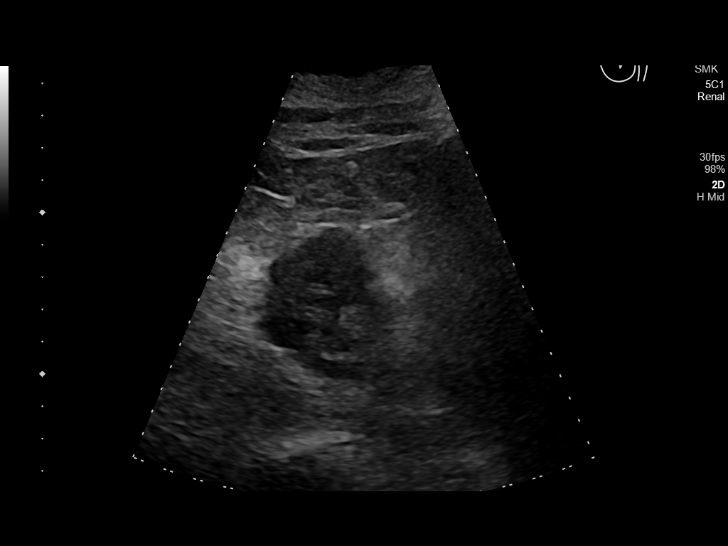
[im 34/74]
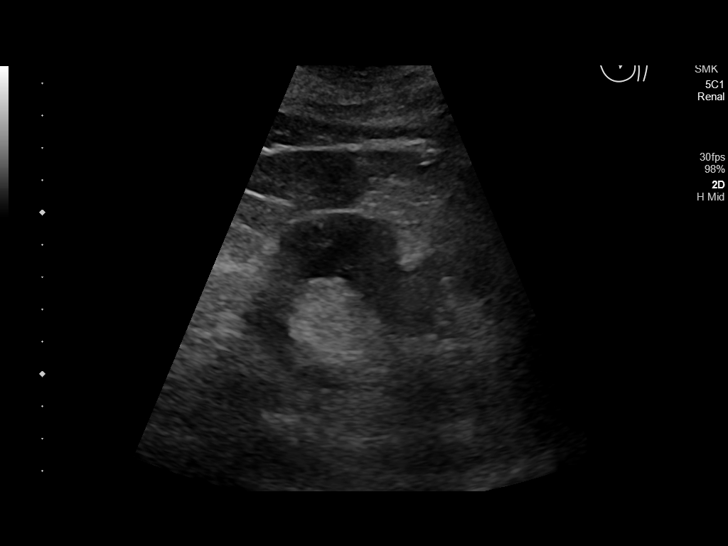
[im 40/74]
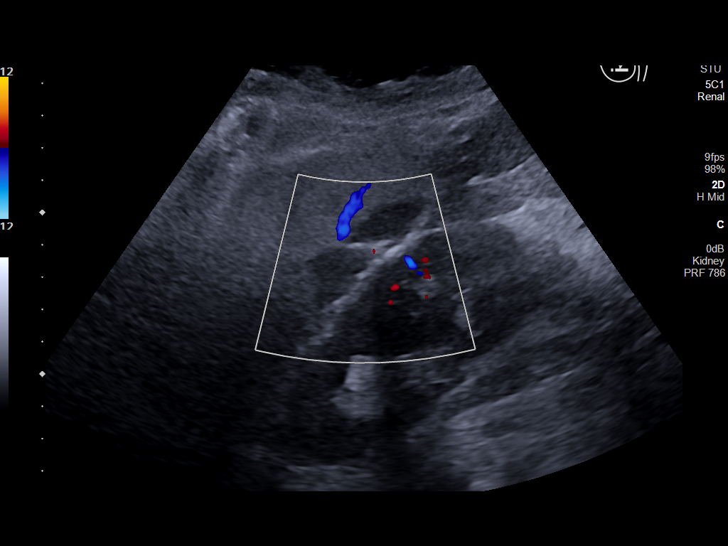
[im 46/74]
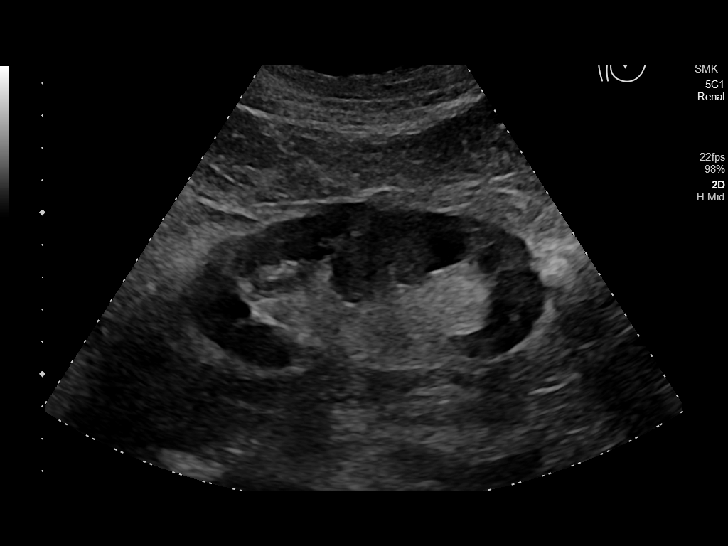
[im 49/74]
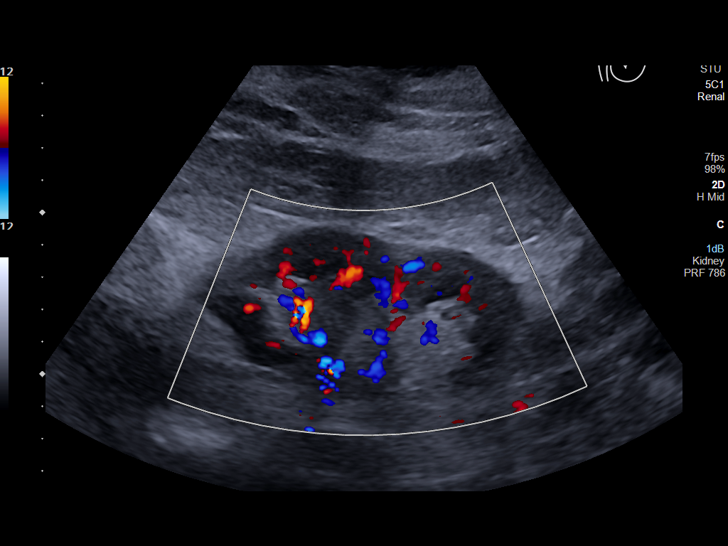
[im 55/74]
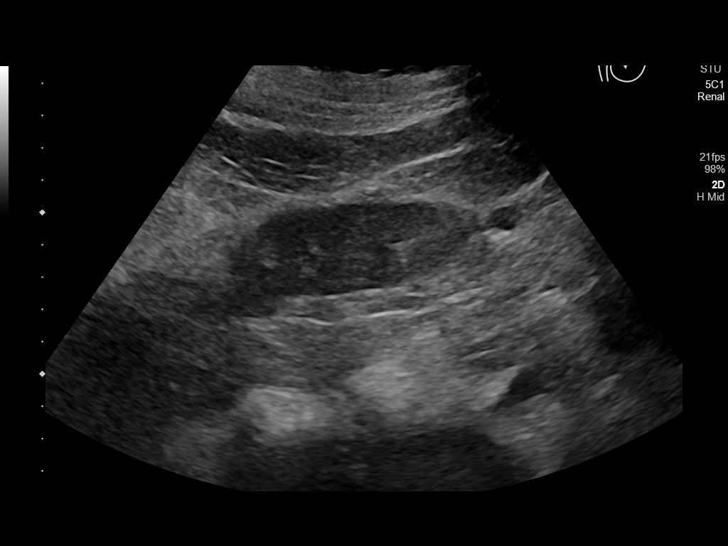
[im 61/74]
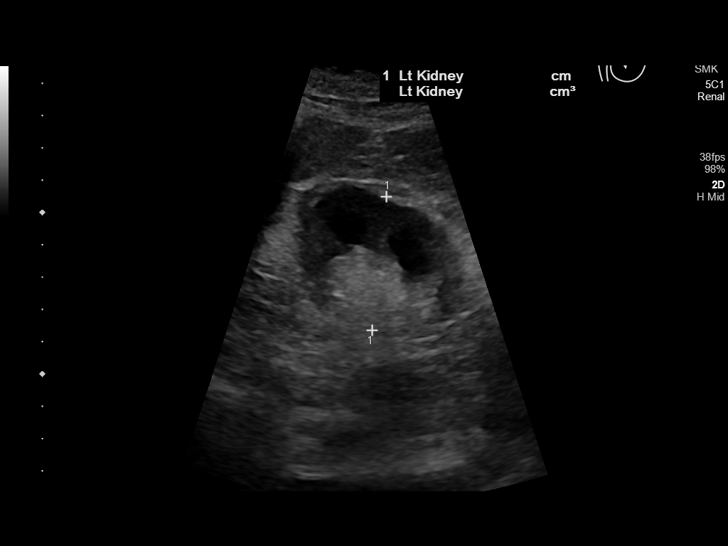
[im 67/74]
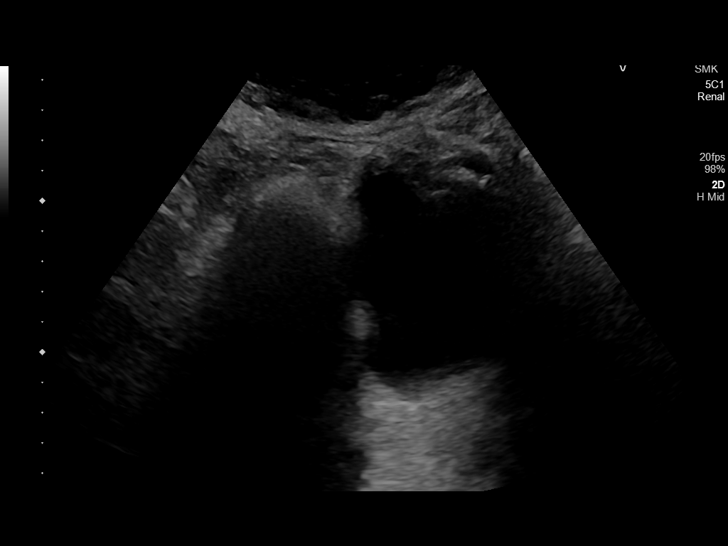
[im 74/74]
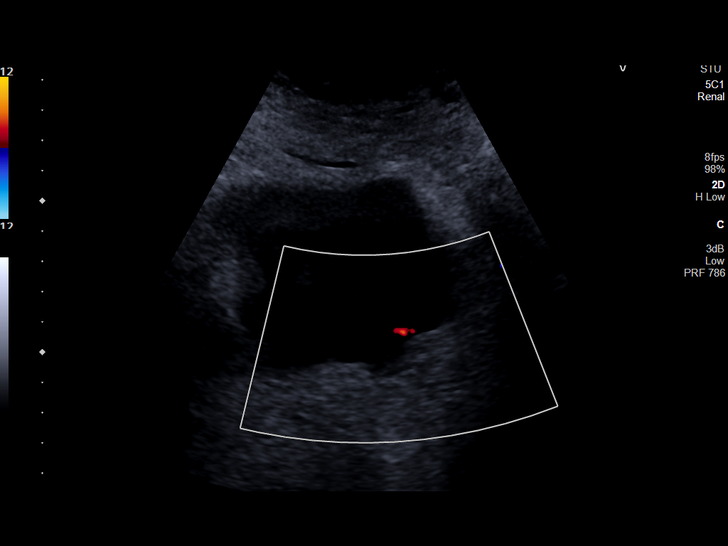

[14 of 25 positions shown; findings below may reference images not displayed]

FINDINGS: Right Kidney:

Renal measurements: 12.3 x 4.5 x 4.8 cm = volume: 139 mL. Contains a
1.7 cm simple cyst of no clinical significance. No follow-up imaging
recommended for the cyst.

Left Kidney:

Renal measurements: 11.4 x 5.2 x 4.2 cm = volume: 129 mL.
Echogenicity within normal limits. No mass or hydronephrosis
visualized.

Bladder:

Appears normal for degree of bladder distention.

Other:

There is a hypoechoic mass identified in the posterior liver
measuring 4.9 x 3.2 x 2.2 cm, containing calcifications. This was
also identified on the CT scan from 6003 and 5377, consistent with a
benign etiology.
IMPRESSION: 1. No cause for decreasing GFR identified. The kidneys are
unremarkable in appearance.

## 2023-10-18 MED ORDER — SEMAGLUTIDE(0.25 OR 0.5MG/DOS) 2 MG/3ML ~~LOC~~ SOPN: 2.0000 mL | PEN_INJECTOR | SUBCUTANEOUS | 3 refills | Status: DC

## 2023-10-21 ENCOUNTER — Other Ambulatory Visit: Payer: Self-pay | Admitting: Family

## 2023-10-21 ENCOUNTER — Other Ambulatory Visit: Payer: Self-pay | Admitting: Cardiology

## 2023-10-21 ENCOUNTER — Telehealth: Payer: Self-pay

## 2023-10-21 MED ORDER — ESCITALOPRAM OXALATE 20 MG PO TABS: 20.0000 mg | ORAL_TABLET | Freq: Every day | ORAL | 3 refills | Status: AC

## 2023-10-21 NOTE — Telephone Encounter (Signed)
 What dose of the wegovy  is the patient supposed to be on?

## 2023-11-08 ENCOUNTER — Other Ambulatory Visit: Payer: Self-pay | Admitting: Cardiology

## 2023-11-08 DIAGNOSIS — I10 Essential (primary) hypertension: Secondary | ICD-10-CM

## 2023-11-10 DIAGNOSIS — M542 Cervicalgia: Secondary | ICD-10-CM | POA: Diagnosis not present

## 2023-11-10 DIAGNOSIS — G890 Central pain syndrome: Secondary | ICD-10-CM | POA: Diagnosis not present

## 2023-11-10 DIAGNOSIS — Z79891 Long term (current) use of opiate analgesic: Secondary | ICD-10-CM | POA: Diagnosis not present

## 2023-11-11 ENCOUNTER — Ambulatory Visit: Admitting: Cardiology

## 2023-11-13 ENCOUNTER — Other Ambulatory Visit: Payer: Self-pay | Admitting: Cardiology

## 2023-11-21 ENCOUNTER — Other Ambulatory Visit: Payer: Self-pay | Admitting: Cardiology

## 2023-11-26 ENCOUNTER — Other Ambulatory Visit: Payer: Self-pay | Admitting: Cardiology

## 2023-11-26 DIAGNOSIS — K219 Gastro-esophageal reflux disease without esophagitis: Secondary | ICD-10-CM

## 2023-12-04 ENCOUNTER — Other Ambulatory Visit: Payer: Self-pay | Admitting: Cardiology

## 2023-12-04 DIAGNOSIS — K219 Gastro-esophageal reflux disease without esophagitis: Secondary | ICD-10-CM

## 2023-12-07 ENCOUNTER — Other Ambulatory Visit: Payer: Self-pay | Admitting: Cardiology

## 2023-12-07 DIAGNOSIS — K219 Gastro-esophageal reflux disease without esophagitis: Secondary | ICD-10-CM

## 2023-12-15 DIAGNOSIS — Z955 Presence of coronary angioplasty implant and graft: Secondary | ICD-10-CM | POA: Diagnosis not present

## 2023-12-15 DIAGNOSIS — I2089 Other forms of angina pectoris: Secondary | ICD-10-CM | POA: Diagnosis not present

## 2023-12-15 DIAGNOSIS — N183 Chronic kidney disease, stage 3 unspecified: Secondary | ICD-10-CM | POA: Diagnosis not present

## 2023-12-15 DIAGNOSIS — I25110 Atherosclerotic heart disease of native coronary artery with unstable angina pectoris: Secondary | ICD-10-CM | POA: Diagnosis not present

## 2023-12-15 DIAGNOSIS — N1831 Chronic kidney disease, stage 3a: Secondary | ICD-10-CM | POA: Diagnosis not present

## 2023-12-15 DIAGNOSIS — I214 Non-ST elevation (NSTEMI) myocardial infarction: Secondary | ICD-10-CM | POA: Diagnosis not present

## 2023-12-15 DIAGNOSIS — E119 Type 2 diabetes mellitus without complications: Secondary | ICD-10-CM | POA: Diagnosis not present

## 2023-12-15 DIAGNOSIS — R0602 Shortness of breath: Secondary | ICD-10-CM | POA: Diagnosis not present

## 2023-12-16 ENCOUNTER — Ambulatory Visit (INDEPENDENT_AMBULATORY_CARE_PROVIDER_SITE_OTHER): Admitting: Cardiology

## 2023-12-16 ENCOUNTER — Encounter: Payer: Self-pay | Admitting: Cardiology

## 2023-12-16 VITALS — BP 124/68 | HR 75 | Ht 60.0 in | Wt 190.0 lb

## 2023-12-16 DIAGNOSIS — Z713 Dietary counseling and surveillance: Secondary | ICD-10-CM | POA: Diagnosis not present

## 2023-12-16 DIAGNOSIS — Z0130 Encounter for examination of blood pressure without abnormal findings: Secondary | ICD-10-CM

## 2023-12-16 DIAGNOSIS — E66812 Obesity, class 2: Secondary | ICD-10-CM | POA: Diagnosis not present

## 2023-12-16 DIAGNOSIS — E1165 Type 2 diabetes mellitus with hyperglycemia: Secondary | ICD-10-CM | POA: Diagnosis not present

## 2023-12-16 MED ORDER — RYBELSUS 3 MG PO TABS
1.0000 | ORAL_TABLET | Freq: Every day | ORAL | 3 refills | Status: DC
Start: 2023-12-16 — End: 2024-01-18

## 2023-12-16 NOTE — Progress Notes (Signed)
 Established Patient Office Visit  Subjective:  Patient ID: Hayley Knight, female    DOB: 1936/03/01  Age: 88 y.o. MRN: [id number]  Chief Complaint  Patient presents with   Follow-up    1 month follow up    Patient in office for one month follow up. Was started on Ozempic  at 10/15/23 office visit. Insurance did not approve medication. Will try to get approval for Rybelsus .     No other concerns at this time.   Past Medical History:  Diagnosis Date   Anxiety 05/09/2018   Arthritis    Aspiration pneumonia of left lower lobe (HCC) 07/04/2022   CAP (community acquired pneumonia) 07/02/2022   Cough 10/21/2021   Diabetes mellitus without complication (HCC)    Type II   Dyspnea 07/09/2021   Fatigue due to exposure 06/15/2022   Hypertension    Hyponatremia 10/21/2021   Sleep apnea    No Cpap   SOB (shortness of breath) 10/21/2021   Viral upper respiratory tract infection 06/15/2022    Past Surgical History:  Procedure Laterality Date   ABDOMINAL HYSTERECTOMY     APPENDECTOMY     CARDIAC CATHETERIZATION     CHOLECYSTECTOMY     DILATION AND CURETTAGE OF UTERUS     MASTECTOMY Right    REPLACEMENT TOTAL KNEE BILATERAL     RIGHT/LEFT HEART CATH AND CORONARY ANGIOGRAPHY Bilateral 07/09/2021   Procedure: RIGHT/LEFT HEART CATH AND CORONARY ANGIOGRAPHY;  Surgeon: Florencio Cara BIRCH, MD;  Location: [facility];  Service: Cardiovascular;  Laterality: Bilateral;   TOOTH EXTRACTION N/A 01/02/2022   Procedure: DENTAL RESTORATION/EXTRACTIONS;  Surgeon: Sheryle Hamilton, DMD;  Location: [facility];  Service: Oral Surgery;  Laterality: N/A;    Social History   Socioeconomic History   Marital status: Widowed    Spouse name: Not on file   Number of children: Not on file   Years of education: Not on file   Highest education level: Not on file  Occupational History   Not on file  Tobacco Use   Smoking status: Never    Passive exposure: Never   Smokeless tobacco: Never  Vaping  Use   Vaping status: Never Used  Substance and Sexual Activity   Alcohol use: No   Drug use: No   Sexual activity: Not on file  Other Topics Concern   Not on file  Social History Narrative   Not on file   Social Drivers of Health   Financial Resource Strain: Not on file  Food Insecurity: No Food Insecurity (08/17/2023)   Hunger Vital Sign    Worried About Running Out of Food in the Last Year: Never true    Ran Out of Food in the Last Year: Never true  Transportation Needs: No Transportation Needs (08/17/2023)   PRAPARE - Administrator, Civil Service (Medical): No    Lack of Transportation (Non-Medical): No  Physical Activity: Not on file  Stress: Not on file  Social Connections: Moderately Isolated (08/13/2023)   Social Connection and Isolation Panel    Frequency of Communication with Friends and Family: Three times a week    Frequency of Social Gatherings with Friends and Family: Three times a week    Attends Religious Services: More than 4 times per year    Active Member of Clubs or Organizations: No    Attends Banker Meetings: Never    Marital Status: Divorced  Catering manager Violence: Not At Risk (08/17/2023)   Humiliation, Afraid,  Rape, and Kick questionnaire    Fear of Current or Ex-Partner: No    Emotionally Abused: No    Physically Abused: No    Sexually Abused: No    History reviewed. No pertinent family history.  Allergies  Allergen Reactions   Mounjaro  [Tirzepatide ] Diarrhea   Iodine Swelling   Ivp Dye [Iodinated Contrast Media] Swelling   Sulfa Antibiotics Swelling    Outpatient Medications Prior to Visit  Medication Sig   albuterol  (VENTOLIN  HFA) 108 (90 Base) MCG/ACT inhaler TAKE 2 PUFFS BY MOUTH EVERY 6 HOURS AS NEEDED FOR WHEEZE OR SHORTNESS OF BREATH   amLODipine  (NORVASC ) 2.5 MG tablet TAKE 1 TABLET BY MOUTH DAILY FOR ELEVATED BLOOD PRESSURE   aspirin  81 MG EC tablet Take 81 mg by mouth at bedtime.   Cholecalciferol   (VITAMIN D3) 125 MCG (5000 UT) CAPS Take 1 capsule (5,000 Units total) by mouth daily.   clopidogrel  (PLAVIX ) 75 MG tablet TAKE 1 TABLET BY MOUTH EVERY DAY   diazepam  (VALIUM ) 5 MG tablet Take 5 mg by mouth every 12 (twelve) hours as needed for anxiety.   escitalopram  (LEXAPRO ) 20 MG tablet Take 1 tablet (20 mg total) by mouth daily.   fenofibrate  (TRICOR ) 145 MG tablet TAKE 1 TABLET (145 MG TOTAL) BY MOUTH AT BEDTIME   fluticasone  (FLONASE ) 50 MCG/ACT nasal spray Place 1 spray into both nostrils daily.   gabapentin  (NEURONTIN ) 300 MG capsule Take 300 mg by mouth 2 (two) times daily as needed (Neck pain).   levocetirizine (XYZAL  ALLERGY 24HR) 5 MG tablet Take 1 tablet (5 mg total) by mouth every evening.   losartan  (COZAAR ) 100 MG tablet TAKE 1 TABLET BY MOUTH EVERY DAY   Multiple Vitamin (MULTIVITAMIN) capsule Take 2 capsules by mouth daily.   nitroGLYCERIN  (NITROSTAT ) 0.4 MG SL tablet Place 0.4 mg under the tongue every 5 (five) minutes as needed for chest pain.   omega-3 acid ethyl esters (LOVAZA ) 1 g capsule TAKE 2 CAPSULES BY MOUTH 2 TIMES DAILY.   oxyCODONE  (OXYCONTIN ) 10 mg 12 hr tablet Take 20 mg by mouth every 12 (twelve) hours.   Pancrelipase , Lip-Prot-Amyl, 24000-76000 units CPEP Take 1 capsule (24,000 Units total) by mouth 3 (three) times daily as needed.   pantoprazole  (PROTONIX ) 40 MG tablet Take 1 tablet (40 mg total) by mouth daily.   predniSONE  (DELTASONE ) 5 MG tablet TAKE 1 TABLET (5 MG TOTAL) BY MOUTH 2 (TWO) TIMES DAILY WITH A MEAL.   sitaGLIPtin  (JANUVIA ) 100 MG tablet Take 1 tablet (100 mg total) by mouth daily.   [DISCONTINUED] Semaglutide ,0.25 or 0.5MG /DOS, 2 MG/3ML SOPN Inject 2 mLs into the skin once a week.   No facility-administered medications prior to visit.    Review of Systems  Constitutional: Negative.   HENT: Negative.    Eyes: Negative.   Respiratory: Negative.  Negative for shortness of breath.   Cardiovascular: Negative.  Negative for chest pain.   Gastrointestinal: Negative.  Negative for abdominal pain, constipation and diarrhea.  Genitourinary: Negative.   Musculoskeletal:  Negative for joint pain and myalgias.  Skin: Negative.   Neurological: Negative.  Negative for dizziness and headaches.  Endo/Heme/Allergies: Negative.   All other systems reviewed and are negative.      Objective:   BP 124/68   Pulse 75   Ht 5' (1.524 m)   Wt 190 lb (86.2 kg)   SpO2 98%   BMI 37.11 kg/m   Vitals:   12/16/23 1428  BP: 124/68  Pulse: 75  Height:  5' (1.524 m)  Weight: 190 lb (86.2 kg)  SpO2: 98%  BMI (Calculated): 37.11    Physical Exam Vitals and nursing note reviewed.  Constitutional:      Appearance: Normal appearance. She is normal weight.  HENT:     Head: Normocephalic and atraumatic.     Nose: Nose normal.     Mouth/Throat:     Mouth: Mucous membranes are moist.  Eyes:     Extraocular Movements: Extraocular movements intact.     Conjunctiva/sclera: Conjunctivae normal.     Pupils: Pupils are equal, round, and reactive to light.  Cardiovascular:     Rate and Rhythm: Normal rate and regular rhythm.     Pulses: Normal pulses.     Heart sounds: Normal heart sounds.  Pulmonary:     Effort: Pulmonary effort is normal.     Breath sounds: Normal breath sounds.  Abdominal:     General: Abdomen is flat. Bowel sounds are normal.     Palpations: Abdomen is soft.  Musculoskeletal:        General: Normal range of motion.     Cervical back: Normal range of motion.  Skin:    General: Skin is warm and dry.  Neurological:     General: No focal deficit present.     Mental Status: She is alert and oriented to person, place, and time.  Psychiatric:        Mood and Affect: Mood normal.        Behavior: Behavior normal.        Thought Content: Thought content normal.        Judgment: Judgment normal.      No results found for any visits on 12/16/23.  Recent Results (from the past 2160 hours)  CMP14+EGFR     Status:  Abnormal   Collection Time: 10/13/23  2:33 PM  Result Value Ref Range   Glucose 96 70 - 99 mg/dL   BUN 19 8 - 27 mg/dL   Creatinine, Ser 8.88 (H) 0.57 - 1.00 mg/dL   eGFR 48 (L) >40 fO/fpw/8.26   BUN/Creatinine Ratio 17 12 - 28   Sodium 140 134 - 144 mmol/L   Potassium 3.8 3.5 - 5.2 mmol/L   Chloride 98 96 - 106 mmol/L   CO2 22 20 - 29 mmol/L   Calcium  9.2 8.7 - 10.3 mg/dL   Total Protein 6.1 6.0 - 8.5 g/dL   Albumin 4.0 3.7 - 4.7 g/dL   Globulin, Total 2.1 1.5 - 4.5 g/dL   Bilirubin Total 0.4 0.0 - 1.2 mg/dL   Alkaline Phosphatase 62 44 - 121 IU/L   AST 20 0 - 40 IU/L   ALT 8 0 - 32 IU/L  Lipid Profile     Status: Abnormal   Collection Time: 10/13/23  2:33 PM  Result Value Ref Range   Cholesterol, Total 130 100 - 199 mg/dL   Triglycerides 846 (H) 0 - 149 mg/dL   HDL 29 (L) >60 mg/dL   VLDL Cholesterol Cal 27 5 - 40 mg/dL   LDL Chol Calc (NIH) 74 0 - 99 mg/dL   Chol/HDL Ratio 4.5 (H) 0.0 - 4.4 ratio    Comment:                                   T. Chol/HDL Ratio  Men  Women                               1/2 Avg.Risk  3.4    3.3                                   Avg.Risk  5.0    4.4                                2X Avg.Risk  9.6    7.1                                3X Avg.Risk 23.4   11.0   Hemoglobin A1c     Status: None   Collection Time: 10/13/23  2:33 PM  Result Value Ref Range   Hgb A1c MFr Bld 5.5 4.8 - 5.6 %    Comment:          Prediabetes: 5.7 - 6.4          Diabetes: >6.4          Glycemic control for adults with diabetes: <7.0    Est. average glucose Bld gHb Est-mCnc 111 mg/dL  TSH     Status: None   Collection Time: 10/13/23  2:33 PM  Result Value Ref Range   TSH 2.100 0.450 - 4.500 uIU/mL      Assessment & Plan:  Rybelsus   Problem List Items Addressed This Visit       Endocrine   Type 2 diabetes mellitus with hyperglycemia (HCC) - Primary   Relevant Medications   Semaglutide  (RYBELSUS ) 3 MG TABS      Other   Class 2 obesity   Weight loss counseling, encounter for    Return in about 4 weeks (around 01/13/2024).   Total time spent: 25 minutes  Google, NP  12/16/2023   This document may have been prepared by Dragon Voice Recognition software and as such may include unintentional dictation errors.

## 2023-12-21 ENCOUNTER — Other Ambulatory Visit: Payer: Self-pay | Admitting: Cardiology

## 2023-12-22 ENCOUNTER — Other Ambulatory Visit: Payer: Self-pay | Admitting: Cardiology

## 2023-12-27 ENCOUNTER — Other Ambulatory Visit: Payer: Self-pay | Admitting: Cardiology

## 2023-12-28 ENCOUNTER — Other Ambulatory Visit: Payer: Self-pay

## 2023-12-28 MED ORDER — FUROSEMIDE 40 MG PO TABS: 40.0000 mg | ORAL_TABLET | Freq: Every day | ORAL | 11 refills | Status: AC

## 2024-01-03 ENCOUNTER — Ambulatory Visit (INDEPENDENT_AMBULATORY_CARE_PROVIDER_SITE_OTHER): Admitting: Cardiology

## 2024-01-03 ENCOUNTER — Encounter: Payer: Self-pay | Admitting: Cardiology

## 2024-01-03 VITALS — BP 118/58 | HR 82 | Ht 60.0 in | Wt 188.0 lb

## 2024-01-03 DIAGNOSIS — J069 Acute upper respiratory infection, unspecified: Secondary | ICD-10-CM

## 2024-01-03 MED ORDER — AMOXICILLIN-POT CLAVULANATE 875-125 MG PO TABS: 1.0000 | ORAL_TABLET | Freq: Two times a day (BID) | ORAL | 0 refills | Status: AC

## 2024-01-03 MED ORDER — MONTELUKAST SODIUM 10 MG PO TABS: 10.0000 mg | ORAL_TABLET | Freq: Every day | ORAL | 2 refills | Status: DC

## 2024-01-03 NOTE — Patient Instructions (Signed)
 Augmentin  antibiotic Singulair   Nasal spray Mucinex  Xyzal  Drink plenty of water

## 2024-01-03 NOTE — Progress Notes (Signed)
 Established Patient Office Visit  Subjective:  Patient ID: Hayley Knight, female    DOB: 03-13-1936  Age: 88 y.o. MRN: [id number]  Chief Complaint  Patient presents with   Follow-up    Chest congestion, throat pain    Patient in office for an acute visit, complaining of chest congestion and throat pain. Patient complaining of cough with thick yellow sputum, sore throat, headache, runny nose and PND. Patient has tried nasal spray and an antihistamine with no relief.  Will send in Augmentin  and Singulair . Recommend Mucinex , continue nasal spray, drink plenty of water.   URI  This is a new problem. The current episode started in the past 7 days. The problem has been unchanged. There has been no fever. Associated symptoms include congestion, coughing, headaches, rhinorrhea, sneezing and a sore throat. Pertinent negatives include no abdominal pain, chest pain, diarrhea, ear pain, joint pain, plugged ear sensation or sinus pain. She has tried antihistamine for the symptoms.    No other concerns at this time.   Past Medical History:  Diagnosis Date   Anxiety 05/09/2018   Arthritis    Aspiration pneumonia of left lower lobe (HCC) 07/04/2022   CAP (community acquired pneumonia) 07/02/2022   Cough 10/21/2021   Diabetes mellitus without complication (HCC)    Type II   Dyspnea 07/09/2021   Fatigue due to exposure 06/15/2022   Hypertension    Hyponatremia 10/21/2021   Sleep apnea    No Cpap   SOB (shortness of breath) 10/21/2021   Viral upper respiratory tract infection 06/15/2022    Past Surgical History:  Procedure Laterality Date   ABDOMINAL HYSTERECTOMY     APPENDECTOMY     CARDIAC CATHETERIZATION     CHOLECYSTECTOMY     DILATION AND CURETTAGE OF UTERUS     MASTECTOMY Right    REPLACEMENT TOTAL KNEE BILATERAL     RIGHT/LEFT HEART CATH AND CORONARY ANGIOGRAPHY Bilateral 07/09/2021   Procedure: RIGHT/LEFT HEART CATH AND CORONARY ANGIOGRAPHY;  Surgeon: Florencio Cara BIRCH,  MD;  Location: [facility];  Service: Cardiovascular;  Laterality: Bilateral;   TOOTH EXTRACTION N/A 01/02/2022   Procedure: DENTAL RESTORATION/EXTRACTIONS;  Surgeon: Sheryle Hamilton, DMD;  Location: [facility] OR;  Service: Oral Surgery;  Laterality: N/A;    Social History   Socioeconomic History   Marital status: Widowed    Spouse name: Not on file   Number of children: Not on file   Years of education: Not on file   Highest education level: Not on file  Occupational History   Not on file  Tobacco Use   Smoking status: Never    Passive exposure: Never   Smokeless tobacco: Never  Vaping Use   Vaping status: Never Used  Substance and Sexual Activity   Alcohol use: No   Drug use: No   Sexual activity: Not on file  Other Topics Concern   Not on file  Social History Narrative   Not on file   Social Drivers of Health   Financial Resource Strain: Not on file  Food Insecurity: No Food Insecurity (08/17/2023)   Hunger Vital Sign    Worried About Running Out of Food in the Last Year: Never true    Ran Out of Food in the Last Year: Never true  Transportation Needs: No Transportation Needs (08/17/2023)   PRAPARE - Administrator, Civil Service (Medical): No    Lack of Transportation (Non-Medical): No  Physical Activity: Not on file  Stress: Not  on file  Social Connections: Moderately Isolated (08/13/2023)   Social Connection and Isolation Panel    Frequency of Communication with Friends and Family: Three times a week    Frequency of Social Gatherings with Friends and Family: Three times a week    Attends Religious Services: More than 4 times per year    Active Member of Clubs or Organizations: No    Attends Banker Meetings: Never    Marital Status: Divorced  Catering manager Violence: Not At Risk (08/17/2023)   Humiliation, Afraid, Rape, and Kick questionnaire    Fear of Current or Ex-Partner: No    Emotionally Abused: No    Physically Abused: No     Sexually Abused: No    History reviewed. No pertinent family history.  Allergies  Allergen Reactions   Mounjaro  [Tirzepatide ] Diarrhea   Iodine Swelling   Ivp Dye [Iodinated Contrast Media] Swelling   Sulfa Antibiotics Swelling    Outpatient Medications Prior to Visit  Medication Sig   albuterol  (VENTOLIN  HFA) 108 (90 Base) MCG/ACT inhaler TAKE 2 PUFFS BY MOUTH EVERY 6 HOURS AS NEEDED FOR WHEEZE OR SHORTNESS OF BREATH   amLODipine  (NORVASC ) 2.5 MG tablet TAKE 1 TABLET BY MOUTH DAILY FOR ELEVATED BLOOD PRESSURE   aspirin  81 MG EC tablet Take 81 mg by mouth at bedtime.   Cholecalciferol  (VITAMIN D3) 125 MCG (5000 UT) CAPS Take 1 capsule (5,000 Units total) by mouth daily.   clopidogrel  (PLAVIX ) 75 MG tablet TAKE 1 TABLET BY MOUTH EVERY DAY   diazepam  (VALIUM ) 5 MG tablet Take 5 mg by mouth every 12 (twelve) hours as needed for anxiety.   escitalopram  (LEXAPRO ) 20 MG tablet Take 1 tablet (20 mg total) by mouth daily.   fenofibrate  (TRICOR ) 145 MG tablet TAKE 1 TABLET (145 MG TOTAL) BY MOUTH AT BEDTIME   fluticasone  (FLONASE ) 50 MCG/ACT nasal spray Place 1 spray into both nostrils daily.   furosemide  (LASIX ) 40 MG tablet Take 1 tablet (40 mg total) by mouth daily.   gabapentin  (NEURONTIN ) 300 MG capsule Take 300 mg by mouth 2 (two) times daily as needed (Neck pain).   levocetirizine (XYZAL  ALLERGY 24HR) 5 MG tablet Take 1 tablet (5 mg total) by mouth every evening.   losartan  (COZAAR ) 100 MG tablet TAKE 1 TABLET BY MOUTH EVERY DAY   Multiple Vitamin (MULTIVITAMIN) capsule Take 2 capsules by mouth daily.   nitroGLYCERIN  (NITROSTAT ) 0.4 MG SL tablet Place 0.4 mg under the tongue every 5 (five) minutes as needed for chest pain.   omega-3 acid ethyl esters (LOVAZA ) 1 g capsule TAKE 2 CAPSULES BY MOUTH TWICE A DAY   oxyCODONE  (OXYCONTIN ) 10 mg 12 hr tablet Take 20 mg by mouth every 12 (twelve) hours.   Pancrelipase , Lip-Prot-Amyl, (CREON ) 24000-76000 units CPEP TAKE 1 CAPSULE (24,000 UNITS  TOTAL) BY MOUTH 3 (THREE) TIMES DAILY BEFORE MEALS FOR 14 DAYS.   pantoprazole  (PROTONIX ) 40 MG tablet Take 1 tablet (40 mg total) by mouth daily.   predniSONE  (DELTASONE ) 5 MG tablet TAKE 1 TABLET (5 MG TOTAL) BY MOUTH 2 (TWO) TIMES DAILY WITH A MEAL.   Semaglutide  (RYBELSUS ) 3 MG TABS Take 1 tablet (3 mg total) by mouth daily.   sitaGLIPtin  (JANUVIA ) 100 MG tablet Take 1 tablet (100 mg total) by mouth daily.   No facility-administered medications prior to visit.    Review of Systems  Constitutional: Negative.   HENT:  Positive for congestion, rhinorrhea, sneezing and sore throat. Negative for ear pain and  sinus pain.   Eyes: Negative.   Respiratory:  Positive for cough and sputum production. Negative for shortness of breath.   Cardiovascular: Negative.  Negative for chest pain.  Gastrointestinal: Negative.  Negative for abdominal pain, constipation and diarrhea.  Genitourinary: Negative.   Musculoskeletal:  Negative for joint pain and myalgias.  Skin: Negative.   Neurological:  Positive for headaches. Negative for dizziness.  Endo/Heme/Allergies: Negative.   All other systems reviewed and are negative.      Objective:   BP (!) 118/58   Pulse 82   Ht 5' (1.524 m)   Wt 188 lb (85.3 kg)   SpO2 92%   BMI 36.72 kg/m   Vitals:   01/03/24 1305  BP: (!) 118/58  Pulse: 82  Height: 5' (1.524 m)  Weight: 188 lb (85.3 kg)  SpO2: 92%  BMI (Calculated): 36.72    Physical Exam Vitals and nursing note reviewed.  Constitutional:      Appearance: Normal appearance. She is normal weight.  HENT:     Head: Normocephalic and atraumatic.     Nose: Nose normal.     Mouth/Throat:     Mouth: Mucous membranes are moist.     Pharynx: Oropharynx is clear.  Eyes:     Extraocular Movements: Extraocular movements intact.     Conjunctiva/sclera: Conjunctivae normal.     Pupils: Pupils are equal, round, and reactive to light.  Cardiovascular:     Rate and Rhythm: Normal rate and regular  rhythm.     Pulses: Normal pulses.  Pulmonary:     Effort: Pulmonary effort is normal.     Breath sounds: Normal breath sounds.  Abdominal:     General: Abdomen is flat.     Palpations: Abdomen is soft.  Musculoskeletal:        General: Normal range of motion.     Cervical back: Normal range of motion and neck supple.     Right lower leg: No edema.     Left lower leg: No edema.  Skin:    General: Skin is warm and dry.  Neurological:     General: No focal deficit present.     Mental Status: She is alert and oriented to person, place, and time. Mental status is at baseline.  Psychiatric:        Mood and Affect: Mood normal.        Behavior: Behavior normal.        Thought Content: Thought content normal.        Judgment: Judgment normal.      No results found for any visits on 01/03/24.  Recent Results (from the past 2160 hours)  CMP14+EGFR     Status: Abnormal   Collection Time: 10/13/23  2:33 PM  Result Value Ref Range   Glucose 96 70 - 99 mg/dL   BUN 19 8 - 27 mg/dL   Creatinine, Ser 8.88 (H) 0.57 - 1.00 mg/dL   eGFR 48 (L) >40 fO/fpw/8.26   BUN/Creatinine Ratio 17 12 - 28   Sodium 140 134 - 144 mmol/L   Potassium 3.8 3.5 - 5.2 mmol/L   Chloride 98 96 - 106 mmol/L   CO2 22 20 - 29 mmol/L   Calcium  9.2 8.7 - 10.3 mg/dL   Total Protein 6.1 6.0 - 8.5 g/dL   Albumin 4.0 3.7 - 4.7 g/dL   Globulin, Total 2.1 1.5 - 4.5 g/dL   Bilirubin Total 0.4 0.0 - 1.2 mg/dL   Alkaline Phosphatase 62 44 -  121 IU/L   AST 20 0 - 40 IU/L   ALT 8 0 - 32 IU/L  Lipid Profile     Status: Abnormal   Collection Time: 10/13/23  2:33 PM  Result Value Ref Range   Cholesterol, Total 130 100 - 199 mg/dL   Triglycerides 846 (H) 0 - 149 mg/dL   HDL 29 (L) >60 mg/dL   VLDL Cholesterol Cal 27 5 - 40 mg/dL   LDL Chol Calc (NIH) 74 0 - 99 mg/dL   Chol/HDL Ratio 4.5 (H) 0.0 - 4.4 ratio    Comment:                                   T. Chol/HDL Ratio                                             Men   Women                               1/2 Avg.Risk  3.4    3.3                                   Avg.Risk  5.0    4.4                                2X Avg.Risk  9.6    7.1                                3X Avg.Risk 23.4   11.0   Hemoglobin A1c     Status: None   Collection Time: 10/13/23  2:33 PM  Result Value Ref Range   Hgb A1c MFr Bld 5.5 4.8 - 5.6 %    Comment:          Prediabetes: 5.7 - 6.4          Diabetes: >6.4          Glycemic control for adults with diabetes: <7.0    Est. average glucose Bld gHb Est-mCnc 111 mg/dL  TSH     Status: None   Collection Time: 10/13/23  2:33 PM  Result Value Ref Range   TSH 2.100 0.450 - 4.500 uIU/mL      Assessment & Plan:  Augmentin  antibiotic Singulair  Nasal spray Mucinex  Xyzal  Drink plenty of water  Problem List Items Addressed This Visit       Respiratory   Acute URI - Primary    Return if symptoms worsen or fail to improve.   Total time spent: 25 minutes  Google, NP  01/03/2024   This document may have been prepared by Dragon Voice Recognition software and as such may include unintentional dictation errors.

## 2024-01-07 ENCOUNTER — Telehealth: Payer: Self-pay | Admitting: Cardiology

## 2024-01-07 NOTE — Telephone Encounter (Signed)
 PT stated that she has had diarrhea for the last 4 days, states that her immodium AD isnt cutting it can you call this pt in comsething to [facility] on [address] or does she need to be seen first?  Thanks

## 2024-01-07 NOTE — Telephone Encounter (Signed)
 Patient called asking for something to be called in for her upset stomach, she said that the Immodium isnt working an she doesn't want to end up in the hospital.

## 2024-01-12 DIAGNOSIS — M542 Cervicalgia: Secondary | ICD-10-CM | POA: Diagnosis not present

## 2024-01-12 DIAGNOSIS — R52 Pain, unspecified: Secondary | ICD-10-CM | POA: Diagnosis not present

## 2024-01-13 NOTE — Telephone Encounter (Signed)
 LM for pt to call back.

## 2024-01-17 ENCOUNTER — Ambulatory Visit: Admitting: Cardiology

## 2024-01-18 ENCOUNTER — Encounter: Payer: Self-pay | Admitting: Cardiology

## 2024-01-18 ENCOUNTER — Ambulatory Visit (INDEPENDENT_AMBULATORY_CARE_PROVIDER_SITE_OTHER): Admitting: Cardiology

## 2024-01-18 VITALS — BP 130/66 | HR 71 | Ht 60.0 in | Wt 187.0 lb

## 2024-01-18 DIAGNOSIS — E66812 Obesity, class 2: Secondary | ICD-10-CM

## 2024-01-18 DIAGNOSIS — Z6836 Body mass index (BMI) 36.0-36.9, adult: Secondary | ICD-10-CM

## 2024-01-18 DIAGNOSIS — Z0130 Encounter for examination of blood pressure without abnormal findings: Secondary | ICD-10-CM

## 2024-01-18 DIAGNOSIS — Z713 Dietary counseling and surveillance: Secondary | ICD-10-CM

## 2024-01-18 NOTE — Progress Notes (Unsigned)
 Established Patient Office Visit  Subjective:  Patient ID: Hayley Knight, female    DOB: [birth year]  Age: 88 y.o. MRN: [id number]  Chief Complaint  Patient presents with   Follow-up    4 week follow up    Patient in office for 4 week follow up. Patient started on Rybelsus  at previous visit to help with weight loss. Patient reports Rybelsus  caused diarrhea so she stopped it. Patient reports making dietary changes to help with weight loss.  Continue current medications.     No other concerns at this time.   Past Medical History:  Diagnosis Date   Anxiety 05/09/2018   Arthritis    Aspiration pneumonia of left lower lobe (HCC) 07/04/2022   CAP (community acquired pneumonia) 07/02/2022   Cough 10/21/2021   Diabetes mellitus without complication (HCC)    Type II   Dyspnea 07/09/2021   Fatigue due to exposure 06/15/2022   Hypertension    Hyponatremia 10/21/2021   Sleep apnea    No Cpap   SOB (shortness of breath) 10/21/2021   Viral upper respiratory tract infection 06/15/2022    Past Surgical History:  Procedure Laterality Date   ABDOMINAL HYSTERECTOMY     APPENDECTOMY     CARDIAC CATHETERIZATION     CHOLECYSTECTOMY     DILATION AND CURETTAGE OF UTERUS     MASTECTOMY Right    REPLACEMENT TOTAL KNEE BILATERAL     RIGHT/LEFT HEART CATH AND CORONARY ANGIOGRAPHY Bilateral 07/09/2021   Procedure: RIGHT/LEFT HEART CATH AND CORONARY ANGIOGRAPHY;  Surgeon: Florencio Cara BIRCH, MD;  Location: [facility];  Service: Cardiovascular;  Laterality: Bilateral;   TOOTH EXTRACTION N/A 01/02/2022   Procedure: DENTAL RESTORATION/EXTRACTIONS;  Surgeon: Sheryle Hamilton, DMD;  Location: [facility];  Service: Oral Surgery;  Laterality: N/A;    Social History   Socioeconomic History   Marital status: Widowed    Spouse name: Not on file   Number of children: Not on file   Years of education: Not on file   Highest education level: Not on file  Occupational History   Not on file   Tobacco Use   Smoking status: Never    Passive exposure: Never   Smokeless tobacco: Never  Vaping Use   Vaping status: Never Used  Substance and Sexual Activity   Alcohol use: No   Drug use: No   Sexual activity: Not on file  Other Topics Concern   Not on file  Social History Narrative   Not on file   Social Drivers of Health   Financial Resource Strain: Not on file  Food Insecurity: No Food Insecurity (08/17/2023)   Hunger Vital Sign    Worried About Running Out of Food in the Last Year: Never true    Ran Out of Food in the Last Year: Never true  Transportation Needs: No Transportation Needs (08/17/2023)   PRAPARE - Administrator, Civil Service (Medical): No    Lack of Transportation (Non-Medical): No  Physical Activity: Not on file  Stress: Not on file  Social Connections: Moderately Isolated (08/13/2023)   Social Connection and Isolation Panel    Frequency of Communication with Friends and Family: Three times a week    Frequency of Social Gatherings with Friends and Family: Three times a week    Attends Religious Services: More than 4 times per year    Active Member of Clubs or Organizations: No    Attends Banker Meetings: Never  Marital Status: Divorced  Catering manager Violence: Not At Risk (08/17/2023)   Humiliation, Afraid, Rape, and Kick questionnaire    Fear of Current or Ex-Partner: No    Emotionally Abused: No    Physically Abused: No    Sexually Abused: No    History reviewed. No pertinent family history.  Allergies  Allergen Reactions   Mounjaro  [Tirzepatide ] Diarrhea   Iodine Swelling   Ivp Dye [Iodinated Contrast Media] Swelling   Sulfa Antibiotics Swelling    Outpatient Medications Prior to Visit  Medication Sig   albuterol  (VENTOLIN  HFA) 108 (90 Base) MCG/ACT inhaler TAKE 2 PUFFS BY MOUTH EVERY 6 HOURS AS NEEDED FOR WHEEZE OR SHORTNESS OF BREATH   ALPRAZolam  (XANAX  XR) 0.5 MG 24 hr tablet Take 0.5 mg by mouth in the  morning and at bedtime.   amLODipine  (NORVASC ) 2.5 MG tablet TAKE 1 TABLET BY MOUTH DAILY FOR ELEVATED BLOOD PRESSURE   aspirin  81 MG EC tablet Take 81 mg by mouth at bedtime.   Cholecalciferol  (VITAMIN D3) 125 MCG (5000 UT) CAPS Take 1 capsule (5,000 Units total) by mouth daily.   clopidogrel  (PLAVIX ) 75 MG tablet TAKE 1 TABLET BY MOUTH EVERY DAY   escitalopram  (LEXAPRO ) 20 MG tablet Take 1 tablet (20 mg total) by mouth daily.   fenofibrate  (TRICOR ) 145 MG tablet TAKE 1 TABLET (145 MG TOTAL) BY MOUTH AT BEDTIME   fluticasone  (FLONASE ) 50 MCG/ACT nasal spray Place 1 spray into both nostrils daily.   furosemide  (LASIX ) 40 MG tablet Take 1 tablet (40 mg total) by mouth daily.   gabapentin  (NEURONTIN ) 300 MG capsule Take 300 mg by mouth 2 (two) times daily as needed (Neck pain).   levocetirizine (XYZAL  ALLERGY 24HR) 5 MG tablet Take 1 tablet (5 mg total) by mouth every evening.   losartan  (COZAAR ) 100 MG tablet TAKE 1 TABLET BY MOUTH EVERY DAY   montelukast  (SINGULAIR ) 10 MG tablet Take 1 tablet (10 mg total) by mouth at bedtime.   Multiple Vitamin (MULTIVITAMIN) capsule Take 2 capsules by mouth daily.   nitroGLYCERIN  (NITROSTAT ) 0.4 MG SL tablet Place 0.4 mg under the tongue every 5 (five) minutes as needed for chest pain.   omega-3 acid ethyl esters (LOVAZA ) 1 g capsule TAKE 2 CAPSULES BY MOUTH TWICE A DAY   oxyCODONE  (OXYCONTIN ) 10 mg 12 hr tablet Take 20 mg by mouth every 12 (twelve) hours.   Pancrelipase , Lip-Prot-Amyl, (CREON ) 24000-76000 units CPEP TAKE 1 CAPSULE (24,000 UNITS TOTAL) BY MOUTH 3 (THREE) TIMES DAILY BEFORE MEALS FOR 14 DAYS.   pantoprazole  (PROTONIX ) 40 MG tablet Take 1 tablet (40 mg total) by mouth daily.   predniSONE  (DELTASONE ) 5 MG tablet TAKE 1 TABLET (5 MG TOTAL) BY MOUTH 2 (TWO) TIMES DAILY WITH A MEAL.   sitaGLIPtin  (JANUVIA ) 100 MG tablet Take 1 tablet (100 mg total) by mouth daily.   [DISCONTINUED] Semaglutide  (RYBELSUS ) 3 MG TABS Take 1 tablet (3 mg total) by mouth  daily.   diazepam  (VALIUM ) 5 MG tablet Take 5 mg by mouth every 12 (twelve) hours as needed for anxiety. (Patient not taking: Reported on 01/18/2024)   No facility-administered medications prior to visit.    Review of Systems  Constitutional: Negative.   HENT: Negative.    Eyes: Negative.   Respiratory: Negative.  Negative for shortness of breath.   Cardiovascular: Negative.  Negative for chest pain.  Gastrointestinal: Negative.  Negative for abdominal pain, constipation and diarrhea.  Genitourinary: Negative.   Musculoskeletal:  Negative for joint pain  and myalgias.  Skin: Negative.   Neurological: Negative.  Negative for dizziness and headaches.  Endo/Heme/Allergies: Negative.   All other systems reviewed and are negative.      Objective:   BP 130/66   Pulse 71   Ht 5' (1.524 m)   Wt 187 lb (84.8 kg)   SpO2 96%   BMI 36.52 kg/m   Vitals:   01/18/24 1500  BP: 130/66  Pulse: 71  Height: 5' (1.524 m)  Weight: 187 lb (84.8 kg)  SpO2: 96%  BMI (Calculated): 36.52    Physical Exam Vitals and nursing note reviewed.  Constitutional:      Appearance: Normal appearance. She is normal weight.  HENT:     Head: Normocephalic and atraumatic.     Nose: Nose normal.     Mouth/Throat:     Mouth: Mucous membranes are moist.  Eyes:     Extraocular Movements: Extraocular movements intact.     Conjunctiva/sclera: Conjunctivae normal.     Pupils: Pupils are equal, round, and reactive to light.  Cardiovascular:     Rate and Rhythm: Normal rate and regular rhythm.     Pulses: Normal pulses.     Heart sounds: Normal heart sounds.  Pulmonary:     Effort: Pulmonary effort is normal.     Breath sounds: Normal breath sounds.  Abdominal:     General: Abdomen is flat. Bowel sounds are normal.     Palpations: Abdomen is soft.  Musculoskeletal:        General: Normal range of motion.     Cervical back: Normal range of motion.  Skin:    General: Skin is warm and dry.   Neurological:     General: No focal deficit present.     Mental Status: She is alert and oriented to person, place, and time.  Psychiatric:        Mood and Affect: Mood normal.        Behavior: Behavior normal.        Thought Content: Thought content normal.        Judgment: Judgment normal.      No results found for any visits on 01/18/24.  No results found for this or any previous visit (from the past 2160 hours).    Assessment & Plan:  Continue current medications.   Problem List Items Addressed This Visit       Other   Class 2 severe obesity due to excess calories with serious comorbidity and body mass index (BMI) of 36.0 to 36.9 in adult - Primary   Weight loss counseling, encounter for    Return in about 2 months (around 03/19/2024) for fasting lab work prior.   Total time spent: 25 minutes  Google, NP  01/18/2024   This document may have been prepared by Dragon Voice Recognition software and as such may include unintentional dictation errors.

## 2024-01-26 ENCOUNTER — Other Ambulatory Visit: Payer: Self-pay | Admitting: Cardiology

## 2024-01-26 DIAGNOSIS — K219 Gastro-esophageal reflux disease without esophagitis: Secondary | ICD-10-CM

## 2024-01-31 ENCOUNTER — Telehealth: Payer: Self-pay

## 2024-01-31 NOTE — Telephone Encounter (Signed)
 Patient is requesting to get  refill on omeprazole , she said she likes that one better than the pantoprazole  she said it works better. Can you send that into [facility] [address]

## 2024-02-01 ENCOUNTER — Other Ambulatory Visit: Payer: Self-pay | Admitting: Cardiology

## 2024-02-01 ENCOUNTER — Ambulatory Visit: Admitting: Cardiology

## 2024-02-01 MED ORDER — OMEPRAZOLE 40 MG PO CPDR: 40.0000 mg | DELAYED_RELEASE_CAPSULE | Freq: Every day | ORAL | 1 refills | Status: AC

## 2024-02-08 ENCOUNTER — Encounter: Payer: Self-pay | Admitting: Cardiology

## 2024-02-08 ENCOUNTER — Ambulatory Visit (INDEPENDENT_AMBULATORY_CARE_PROVIDER_SITE_OTHER): Admitting: Cardiology

## 2024-02-08 VITALS — BP 130/58 | HR 76 | Ht 60.0 in | Wt 184.2 lb

## 2024-02-08 DIAGNOSIS — R160 Hepatomegaly, not elsewhere classified: Secondary | ICD-10-CM | POA: Diagnosis not present

## 2024-02-08 DIAGNOSIS — R1011 Right upper quadrant pain: Secondary | ICD-10-CM | POA: Insufficient documentation

## 2024-02-08 DIAGNOSIS — Z0130 Encounter for examination of blood pressure without abnormal findings: Secondary | ICD-10-CM

## 2024-02-08 DIAGNOSIS — Z131 Encounter for screening for diabetes mellitus: Secondary | ICD-10-CM

## 2024-02-08 MED ORDER — PREDNISONE 50 MG PO TABS: ORAL_TABLET | ORAL | 0 refills | Status: DC

## 2024-02-08 NOTE — Progress Notes (Unsigned)
 Established Patient Office Visit  Subjective:  Patient ID: Hayley Knight, female    DOB: 04-07-1936  Age: 88 y.o. MRN: [id number]  Chief Complaint  Patient presents with   Acute Visit    Pain on right side started a month ago, has muscle spasms that occurs from her back to her ribs on right side. Claims she had a cyst under her liver she would like for it to be evaluated.    Patient in office for an acute visit, patient complaining of pain on her right side, started about a month ago. Pain starts under right breast, radiates to her spine. Pain is constant.  CT liver 08/2022 No significant change in a partially calcified, near fluid attenuation subcapsular lesion of the posteroinferior right lobe of the liver, hepatic segment VI, measuring 3.8 x 2.6 cm. No suspicious contrast enhancement. Findings are most consistent with a benign, partially calcified cyst or hemangioma, for which no further follow-up or characterization is required. Will repeat CT of the liver to rule out any changes.     No other concerns at this time.   Past Medical History:  Diagnosis Date   Anxiety 05/09/2018   Arthritis    Aspiration pneumonia of left lower lobe (HCC) 07/04/2022   CAP (community acquired pneumonia) 07/02/2022   Cough 10/21/2021   Diabetes mellitus without complication (HCC)    Type II   Dyspnea 07/09/2021   Fatigue due to exposure 06/15/2022   Hypertension    Hyponatremia 10/21/2021   Sleep apnea    No Cpap   SOB (shortness of breath) 10/21/2021   Viral upper respiratory tract infection 06/15/2022    Past Surgical History:  Procedure Laterality Date   ABDOMINAL HYSTERECTOMY     APPENDECTOMY     CARDIAC CATHETERIZATION     CHOLECYSTECTOMY     DILATION AND CURETTAGE OF UTERUS     MASTECTOMY Right    REPLACEMENT TOTAL KNEE BILATERAL     RIGHT/LEFT HEART CATH AND CORONARY ANGIOGRAPHY Bilateral 07/09/2021   Procedure: RIGHT/LEFT HEART CATH AND CORONARY ANGIOGRAPHY;  Surgeon:  Florencio Cara BIRCH, MD;  Location: [facility];  Service: Cardiovascular;  Laterality: Bilateral;   TOOTH EXTRACTION N/A 01/02/2022   Procedure: DENTAL RESTORATION/EXTRACTIONS;  Surgeon: Sheryle Hamilton, DMD;  Location: [facility];  Service: Oral Surgery;  Laterality: N/A;    Social History   Socioeconomic History   Marital status: Widowed    Spouse name: Not on file   Number of children: Not on file   Years of education: Not on file   Highest education level: Not on file  Occupational History   Not on file  Tobacco Use   Smoking status: Never    Passive exposure: Never   Smokeless tobacco: Never  Vaping Use   Vaping status: Never Used  Substance and Sexual Activity   Alcohol use: No   Drug use: No   Sexual activity: Not on file  Other Topics Concern   Not on file  Social History Narrative   Not on file   Social Drivers of Health   Financial Resource Strain: Not on file  Food Insecurity: No Food Insecurity (08/17/2023)   Hunger Vital Sign    Worried About Running Out of Food in the Last Year: Never true    Ran Out of Food in the Last Year: Never true  Transportation Needs: No Transportation Needs (08/17/2023)   PRAPARE - Administrator, Civil Service (Medical): No  Lack of Transportation (Non-Medical): No  Physical Activity: Not on file  Stress: Not on file  Social Connections: Moderately Isolated (08/13/2023)   Social Connection and Isolation Panel    Frequency of Communication with Friends and Family: Three times a week    Frequency of Social Gatherings with Friends and Family: Three times a week    Attends Religious Services: More than 4 times per year    Active Member of Clubs or Organizations: No    Attends Banker Meetings: Never    Marital Status: Divorced  Catering manager Violence: Not At Risk (08/17/2023)   Humiliation, Afraid, Rape, and Kick questionnaire    Fear of Current or Ex-Partner: No    Emotionally Abused: No     Physically Abused: No    Sexually Abused: No    History reviewed. No pertinent family history.  Allergies  Allergen Reactions   Mounjaro  [Tirzepatide ] Diarrhea   Iodine Swelling   Ivp Dye [Iodinated Contrast Media] Swelling   Sulfa Antibiotics Swelling    Outpatient Medications Prior to Visit  Medication Sig   albuterol  (VENTOLIN  HFA) 108 (90 Base) MCG/ACT inhaler TAKE 2 PUFFS BY MOUTH EVERY 6 HOURS AS NEEDED FOR WHEEZE OR SHORTNESS OF BREATH   ALPRAZolam  (XANAX  XR) 0.5 MG 24 hr tablet Take 0.5 mg by mouth in the morning and at bedtime.   amLODipine  (NORVASC ) 2.5 MG tablet TAKE 1 TABLET BY MOUTH DAILY FOR ELEVATED BLOOD PRESSURE   aspirin  81 MG EC tablet Take 81 mg by mouth at bedtime.   Cholecalciferol  (VITAMIN D3) 125 MCG (5000 UT) CAPS Take 1 capsule (5,000 Units total) by mouth daily.   clopidogrel  (PLAVIX ) 75 MG tablet TAKE 1 TABLET BY MOUTH EVERY DAY   escitalopram  (LEXAPRO ) 20 MG tablet Take 1 tablet (20 mg total) by mouth daily.   fenofibrate  (TRICOR ) 145 MG tablet TAKE 1 TABLET (145 MG TOTAL) BY MOUTH AT BEDTIME   fluticasone  (FLONASE ) 50 MCG/ACT nasal spray Place 1 spray into both nostrils daily.   furosemide  (LASIX ) 40 MG tablet Take 1 tablet (40 mg total) by mouth daily.   gabapentin  (NEURONTIN ) 300 MG capsule Take 300 mg by mouth 2 (two) times daily as needed (Neck pain).   levocetirizine (XYZAL  ALLERGY 24HR) 5 MG tablet Take 1 tablet (5 mg total) by mouth every evening.   losartan  (COZAAR ) 100 MG tablet TAKE 1 TABLET BY MOUTH EVERY DAY   montelukast  (SINGULAIR ) 10 MG tablet Take 1 tablet (10 mg total) by mouth at bedtime.   nitroGLYCERIN  (NITROSTAT ) 0.4 MG SL tablet Place 0.4 mg under the tongue every 5 (five) minutes as needed for chest pain.   omega-3 acid ethyl esters (LOVAZA ) 1 g capsule TAKE 2 CAPSULES BY MOUTH TWICE A DAY   omeprazole  (PRILOSEC) 40 MG capsule Take 1 capsule (40 mg total) by mouth daily.   oxyCODONE  (OXYCONTIN ) 10 mg 12 hr tablet Take 20 mg by  mouth every 12 (twelve) hours.   Pancrelipase , Lip-Prot-Amyl, (CREON ) 24000-76000 units CPEP TAKE 1 CAPSULE (24,000 UNITS TOTAL) BY MOUTH 3 (THREE) TIMES DAILY BEFORE MEALS FOR 14 DAYS.   predniSONE  (DELTASONE ) 5 MG tablet TAKE 1 TABLET (5 MG TOTAL) BY MOUTH 2 (TWO) TIMES DAILY WITH A MEAL. (Patient taking differently: Take 5 mg by mouth as needed.)   sitaGLIPtin  (JANUVIA ) 100 MG tablet Take 1 tablet (100 mg total) by mouth daily.   diazepam  (VALIUM ) 5 MG tablet Take 5 mg by mouth every 12 (twelve) hours as needed for anxiety. (  Patient not taking: Reported on 02/08/2024)   Multiple Vitamin (MULTIVITAMIN) capsule Take 2 capsules by mouth daily. (Patient not taking: Reported on 02/08/2024)   No facility-administered medications prior to visit.    Review of Systems  Constitutional: Negative.   HENT: Negative.    Eyes: Negative.   Respiratory: Negative.  Negative for shortness of breath.   Cardiovascular: Negative.  Negative for chest pain.  Gastrointestinal:  Positive for abdominal pain. Negative for constipation and diarrhea.  Genitourinary: Negative.   Musculoskeletal:  Negative for joint pain and myalgias.  Skin: Negative.   Neurological: Negative.  Negative for dizziness and headaches.  Endo/Heme/Allergies: Negative.   All other systems reviewed and are negative.      Objective:   BP (!) 130/58   Pulse 76   Ht 5' (1.524 m)   Wt 184 lb 3.2 oz (83.6 kg)   SpO2 96%   BMI 35.97 kg/m   Vitals:   02/08/24 1442  BP: (!) 130/58  Pulse: 76  Height: 5' (1.524 m)  Weight: 184 lb 3.2 oz (83.6 kg)  SpO2: 96%  BMI (Calculated): 35.97    Physical Exam Vitals and nursing note reviewed.  Constitutional:      Appearance: Normal appearance. She is normal weight.  HENT:     Head: Normocephalic and atraumatic.     Nose: Nose normal.     Mouth/Throat:     Mouth: Mucous membranes are moist.  Eyes:     Extraocular Movements: Extraocular movements intact.     Conjunctiva/sclera:  Conjunctivae normal.     Pupils: Pupils are equal, round, and reactive to light.  Cardiovascular:     Rate and Rhythm: Normal rate and regular rhythm.     Pulses: Normal pulses.     Heart sounds: Normal heart sounds.  Pulmonary:     Effort: Pulmonary effort is normal.     Breath sounds: Normal breath sounds.  Abdominal:     General: Abdomen is flat. Bowel sounds are normal.     Palpations: Abdomen is soft.  Musculoskeletal:        General: Normal range of motion.     Cervical back: Normal range of motion.  Skin:    General: Skin is warm and dry.  Neurological:     General: No focal deficit present.     Mental Status: She is alert and oriented to person, place, and time.  Psychiatric:        Mood and Affect: Mood normal.        Behavior: Behavior normal.        Thought Content: Thought content normal.        Judgment: Judgment normal.      No results found for any visits on 02/08/24.  No results found for this or any previous visit (from the past 2160 hours).    Assessment & Plan:  CT liver   Problem List Items Addressed This Visit       Other   Liver mass   Relevant Orders   CT ABDOMEN W WO CONTRAST   RUQ pain - Primary   Relevant Orders   CT ABDOMEN W WO CONTRAST    Return if symptoms worsen or fail to improve, for as scheduled.   Total time spent: 25 minutes  Google, NP  02/08/2024   This document may have been prepared by Dragon Voice Recognition software and as such may include unintentional dictation errors.

## 2024-02-14 ENCOUNTER — Ambulatory Visit
Admission: RE | Admit: 2024-02-14 | Discharge: 2024-02-14 | Disposition: A | Discharge status: Home / Self Care | Source: Ambulatory Visit | Attending: Cardiology | Admitting: Cardiology

## 2024-02-14 DIAGNOSIS — R160 Hepatomegaly, not elsewhere classified: Secondary | ICD-10-CM | POA: Insufficient documentation

## 2024-02-14 DIAGNOSIS — N281 Cyst of kidney, acquired: Secondary | ICD-10-CM | POA: Diagnosis not present

## 2024-02-14 DIAGNOSIS — R1011 Right upper quadrant pain: Secondary | ICD-10-CM | POA: Diagnosis not present

## 2024-02-14 MED ORDER — IOHEXOL 300 MG/ML  SOLN
100.0000 mL | Freq: Once | INTRAMUSCULAR | Status: AC | PRN
  Administered 2024-02-14: 100 mL via INTRAVENOUS

## 2024-02-17 ENCOUNTER — Ambulatory Visit: Payer: Self-pay | Admitting: Cardiology

## 2024-02-17 NOTE — Progress Notes (Signed)
Pt informed

## 2024-02-22 ENCOUNTER — Other Ambulatory Visit: Payer: Self-pay | Admitting: Cardiology

## 2024-02-24 ENCOUNTER — Other Ambulatory Visit: Payer: Self-pay | Admitting: Cardiology

## 2024-02-29 ENCOUNTER — Ambulatory Visit: Admitting: Cardiology

## 2024-03-03 ENCOUNTER — Encounter: Payer: Self-pay | Admitting: Cardiology

## 2024-03-03 ENCOUNTER — Ambulatory Visit (INDEPENDENT_AMBULATORY_CARE_PROVIDER_SITE_OTHER): Admitting: Cardiology

## 2024-03-03 VITALS — BP 138/68 | HR 88 | Ht 60.0 in | Wt 177.0 lb

## 2024-03-03 DIAGNOSIS — E66811 Obesity, class 1: Secondary | ICD-10-CM

## 2024-03-03 DIAGNOSIS — K861 Other chronic pancreatitis: Secondary | ICD-10-CM | POA: Diagnosis not present

## 2024-03-03 DIAGNOSIS — Z6834 Body mass index (BMI) 34.0-34.9, adult: Secondary | ICD-10-CM

## 2024-03-03 DIAGNOSIS — F419 Anxiety disorder, unspecified: Secondary | ICD-10-CM | POA: Diagnosis not present

## 2024-03-03 DIAGNOSIS — Z0130 Encounter for examination of blood pressure without abnormal findings: Secondary | ICD-10-CM

## 2024-03-03 DIAGNOSIS — F32A Depression, unspecified: Secondary | ICD-10-CM

## 2024-03-03 DIAGNOSIS — S59901A Unspecified injury of right elbow, initial encounter: Secondary | ICD-10-CM

## 2024-03-03 DIAGNOSIS — E6609 Other obesity due to excess calories: Secondary | ICD-10-CM

## 2024-03-03 DIAGNOSIS — Z131 Encounter for screening for diabetes mellitus: Secondary | ICD-10-CM

## 2024-03-03 MED ORDER — CIPROFLOXACIN HCL 0.3 % OP SOLN: 2.0000 [drp] | OPHTHALMIC | 0 refills | Status: DC

## 2024-03-03 NOTE — Progress Notes (Signed)
 Established Patient Office Visit  Subjective:  Patient ID: Hayley Knight, female    DOB: Jan 06, 1936  Age: 88 y.o. MRN: [id number]  Chief Complaint  Patient presents with   Acute Visit    Pt fell at home 2 weeks ago    Patient in office for an acute visit, fell at home 2 weeks ago on her right elbow. Patient on aspirin  and clopidogrel , did not hit her head, did not go to the ED. Patient right elbow visibly bruised. No change in range of motion. Patient reports pain is minimal. Do not feel it is broken, will defer an x-ray at this time.  Patient requesting a referral to GI for chronic pancreatitis, referral sent. Patient's daughter passed away a few months ago. Patient depression and anxiety controlled on Lexapro  20 mg, continue same dose.  Patient successfully losing weight on diet and exercise.  Continue current medications.     No other concerns at this time.   Past Medical History:  Diagnosis Date   Anxiety 05/09/2018   Arthritis    Aspiration pneumonia of left lower lobe (HCC) 07/04/2022   CAP (community acquired pneumonia) 07/02/2022   Cough 10/21/2021   Diabetes mellitus without complication (HCC)    Type II   Diarrhea due to drug 08/13/2023   Dyspnea 07/09/2021   Fatigue due to exposure 06/15/2022   Hypertension    Hyponatremia 10/21/2021   Sleep apnea    No Cpap   SOB (shortness of breath) 10/21/2021   Viral upper respiratory tract infection 06/15/2022    Past Surgical History:  Procedure Laterality Date   ABDOMINAL HYSTERECTOMY     APPENDECTOMY     CARDIAC CATHETERIZATION     CHOLECYSTECTOMY     DILATION AND CURETTAGE OF UTERUS     MASTECTOMY Right    REPLACEMENT TOTAL KNEE BILATERAL     RIGHT/LEFT HEART CATH AND CORONARY ANGIOGRAPHY Bilateral 07/09/2021   Procedure: RIGHT/LEFT HEART CATH AND CORONARY ANGIOGRAPHY;  Surgeon: Florencio Cara BIRCH, MD;  Location: [facility] INV[facility];  Service: Cardiovascular;  Laterality: Bilateral;   TOOTH EXTRACTION N/A  01/02/2022   Procedure: DENTAL RESTORATION/EXTRACTIONS;  Surgeon: Sheryle Hamilton, DMD;  Location: [facility] OR;  Service: Oral Surgery;  Laterality: N/A;    Social History   Socioeconomic History   Marital status: Widowed    Spouse name: Not on file   Number of children: Not on file   Years of education: Not on file   Highest education level: Not on file  Occupational History   Not on file  Tobacco Use   Smoking status: Never    Passive exposure: Never   Smokeless tobacco: Never  Vaping Use   Vaping status: Never Used  Substance and Sexual Activity   Alcohol use: No   Drug use: No   Sexual activity: Not on file  Other Topics Concern   Not on file  Social History Narrative   Not on file   Social Drivers of Health   Financial Resource Strain: Not on file  Food Insecurity: No Food Insecurity (08/17/2023)   Hunger Vital Sign    Worried About Running Out of Food in the Last Year: Never true    Ran Out of Food in the Last Year: Never true  Transportation Needs: No Transportation Needs (08/17/2023)   PRAPARE - Administrator, Civil Service (Medical): No    Lack of Transportation (Non-Medical): No  Physical Activity: Not on file  Stress: Not on file  Social Connections: Moderately Isolated (08/13/2023)   Social Connection and Isolation Panel    Frequency of Communication with Friends and Family: Three times a week    Frequency of Social Gatherings with Friends and Family: Three times a week    Attends Religious Services: More than 4 times per year    Active Member of Clubs or Organizations: No    Attends Banker Meetings: Never    Marital Status: Divorced  Catering Manager Violence: Not At Risk (08/17/2023)   Humiliation, Afraid, Rape, and Kick questionnaire    Fear of Current or Ex-Partner: No    Emotionally Abused: No    Physically Abused: No    Sexually Abused: No    History reviewed. No pertinent family history.  Allergies  Allergen Reactions    Mounjaro  [Tirzepatide ] Diarrhea   Iodine Swelling   Ivp Dye [Iodinated Contrast Media] Swelling   Sulfa Antibiotics Swelling    Outpatient Medications Prior to Visit  Medication Sig Note   albuterol  (VENTOLIN  HFA) 108 (90 Base) MCG/ACT inhaler TAKE 2 PUFFS BY MOUTH EVERY 6 HOURS AS NEEDED FOR WHEEZE OR SHORTNESS OF BREATH    ALPRAZolam  (XANAX  XR) 0.5 MG 24 hr tablet Take 0.5 mg by mouth in the morning and at bedtime.    amLODipine  (NORVASC ) 2.5 MG tablet TAKE 1 TABLET BY MOUTH DAILY FOR ELEVATED BLOOD PRESSURE    aspirin  81 MG EC tablet Take 81 mg by mouth at bedtime.    Cholecalciferol  (VITAMIN D3) 125 MCG (5000 UT) CAPS Take 1 capsule (5,000 Units total) by mouth daily.    clopidogrel  (PLAVIX ) 75 MG tablet TAKE 1 TABLET BY MOUTH EVERY DAY    CREON  24000-76000 units CPEP TAKE 1 CAPSULE (24,000 UNITS TOTAL) BY MOUTH 3 (THREE) TIMES DAILY BEFORE MEALS FOR 14 DAYS.    escitalopram  (LEXAPRO ) 20 MG tablet Take 1 tablet (20 mg total) by mouth daily.    fenofibrate  (TRICOR ) 145 MG tablet TAKE 1 TABLET (145 MG TOTAL) BY MOUTH AT BEDTIME    fluticasone  (FLONASE ) 50 MCG/ACT nasal spray Place 1 spray into both nostrils daily.    furosemide  (LASIX ) 40 MG tablet Take 1 tablet (40 mg total) by mouth daily.    levocetirizine (XYZAL ) 5 MG tablet TAKE 1 TABLET BY MOUTH EVERY DAY IN THE EVENING    losartan  (COZAAR ) 100 MG tablet TAKE 1 TABLET BY MOUTH EVERY DAY    montelukast  (SINGULAIR ) 10 MG tablet Take 1 tablet (10 mg total) by mouth at bedtime.    Multiple Vitamin (MULTIVITAMIN) capsule Take 2 capsules by mouth daily.    nitroGLYCERIN  (NITROSTAT ) 0.4 MG SL tablet Place 0.4 mg under the tongue every 5 (five) minutes as needed for chest pain.    omega-3 acid ethyl esters (LOVAZA ) 1 g capsule TAKE 2 CAPSULES BY MOUTH TWICE A DAY    omeprazole  (PRILOSEC) 40 MG capsule Take 1 capsule (40 mg total) by mouth daily.    oxyCODONE  (OXYCONTIN ) 10 mg 12 hr tablet Take 20 mg by mouth every 12 (twelve) hours.     predniSONE  (DELTASONE ) 5 MG tablet TAKE 1 TABLET (5 MG TOTAL) BY MOUTH 2 (TWO) TIMES DAILY WITH A MEAL. (Patient taking differently: Take 5 mg by mouth as needed.)    predniSONE  (DELTASONE ) 50 MG tablet Take one tablet 13 hours, 7 hours and 1 hour prior to study    sitaGLIPtin  (JANUVIA ) 100 MG tablet Take 1 tablet (100 mg total) by mouth daily.    [DISCONTINUED] ciprofloxacin  (CILOXAN ) 0.3 %  ophthalmic solution Place 1 drop into both eyes every 4 (four) hours while awake. Administer 1 drop, every 2 hours, while awake, for 2 days. Then 1 drop, every 4 hours, while awake, for the next 5 days.    [DISCONTINUED] diazepam  (VALIUM ) 5 MG tablet Take 5 mg by mouth every 12 (twelve) hours as needed for anxiety. (Patient not taking: Reported on 02/08/2024) 03/03/2024: per pr request   [DISCONTINUED] gabapentin  (NEURONTIN ) 300 MG capsule Take 300 mg by mouth 2 (two) times daily as needed (Neck pain). 03/03/2024: per pt request   No facility-administered medications prior to visit.    Review of Systems  Constitutional: Negative.   HENT: Negative.    Eyes: Negative.   Respiratory: Negative.  Negative for shortness of breath.   Cardiovascular: Negative.  Negative for chest pain.  Gastrointestinal: Negative.  Negative for abdominal pain, constipation and diarrhea.  Genitourinary: Negative.   Musculoskeletal:  Negative for joint pain and myalgias.  Skin: Negative.   Neurological: Negative.  Negative for dizziness and headaches.  Endo/Heme/Allergies: Negative.   All other systems reviewed and are negative.      Objective:   BP 138/68   Pulse 88   Ht 5' (1.524 m)   Wt 177 lb (80.3 kg)   SpO2 96%   BMI 34.57 kg/m   Vitals:   03/03/24 1332  BP: 138/68  Pulse: 88  Height: 5' (1.524 m)  Weight: 177 lb (80.3 kg)  SpO2: 96%  BMI (Calculated): 34.57    Physical Exam Vitals and nursing note reviewed.  Constitutional:      Appearance: Normal appearance. She is normal weight.  HENT:     Head:  Normocephalic and atraumatic.     Nose: Nose normal.     Mouth/Throat:     Mouth: Mucous membranes are moist.  Eyes:     Extraocular Movements: Extraocular movements intact.     Conjunctiva/sclera: Conjunctivae normal.     Pupils: Pupils are equal, round, and reactive to light.  Cardiovascular:     Rate and Rhythm: Normal rate and regular rhythm.     Pulses: Normal pulses.     Heart sounds: Normal heart sounds.  Pulmonary:     Effort: Pulmonary effort is normal.     Breath sounds: Normal breath sounds.  Abdominal:     General: Abdomen is flat. Bowel sounds are normal.     Palpations: Abdomen is soft.  Musculoskeletal:        General: Normal range of motion.     Cervical back: Normal range of motion.  Skin:    General: Skin is warm and dry.  Neurological:     General: No focal deficit present.     Mental Status: She is alert and oriented to person, place, and time.  Psychiatric:        Mood and Affect: Mood normal.        Behavior: Behavior normal.        Thought Content: Thought content normal.        Judgment: Judgment normal.      No results found for any visits on 03/03/24.  No results found for this or any previous visit (from the past 2160 hours).    Assessment & Plan:  Referral sent to GI. Continue diet and exercise. Continue current medications.   Problem List Items Addressed This Visit       Digestive   Chronic pancreatitis [facility])   Relevant Orders   Ambulatory referral to Gastroenterology  Other   Anxiety and depression   Elbow injury, right, initial encounter - Primary   Class 1 obesity due to excess calories with serious comorbidity and body mass index (BMI) of 34.0 to 34.9 in adult    Return if symptoms worsen or fail to improve, for as scheduled.   Total time spent: 25 minutes. This time includes review of previous notes and results and patient face to face interaction during today's visit.    Jeoffrey Pollen, NP  03/03/2024   This  document may have been prepared by Dragon Voice Recognition software and as such may include unintentional dictation errors.

## 2024-03-06 ENCOUNTER — Encounter: Payer: Self-pay | Admitting: Cardiology

## 2024-03-06 DIAGNOSIS — S59901A Unspecified injury of right elbow, initial encounter: Secondary | ICD-10-CM | POA: Insufficient documentation

## 2024-03-06 DIAGNOSIS — K861 Other chronic pancreatitis: Secondary | ICD-10-CM | POA: Insufficient documentation

## 2024-03-06 DIAGNOSIS — E66811 Obesity, class 1: Secondary | ICD-10-CM | POA: Insufficient documentation

## 2024-03-13 ENCOUNTER — Ambulatory Visit (INDEPENDENT_AMBULATORY_CARE_PROVIDER_SITE_OTHER): Admitting: Cardiology

## 2024-03-13 ENCOUNTER — Encounter: Payer: Self-pay | Admitting: Cardiology

## 2024-03-13 VITALS — BP 138/76 | HR 84 | Ht 60.0 in | Wt 176.4 lb

## 2024-03-13 DIAGNOSIS — N1832 Chronic kidney disease, stage 3b: Secondary | ICD-10-CM

## 2024-03-13 DIAGNOSIS — E785 Hyperlipidemia, unspecified: Secondary | ICD-10-CM

## 2024-03-13 DIAGNOSIS — I10 Essential (primary) hypertension: Secondary | ICD-10-CM | POA: Diagnosis not present

## 2024-03-13 DIAGNOSIS — E1165 Type 2 diabetes mellitus with hyperglycemia: Secondary | ICD-10-CM | POA: Diagnosis not present

## 2024-03-13 DIAGNOSIS — Z9181 History of falling: Secondary | ICD-10-CM

## 2024-03-13 NOTE — Progress Notes (Signed)
 Established Patient Office Visit  Subjective:  Patient ID: Hayley Knight, female    DOB: 03-19-1936  Age: 88 y.o. MRN: [id number]  Chief Complaint  Patient presents with   Follow-up    2 week follow up    Patient in office for 2 week follow up. Patient is doing well. No new complaints today. Elbow is looking better, still bruised. Continues to lose weight through diet and exercise.  Scheduled for regular follow up in 2 weeks, fasting lab work prior.  Blood pressure ok today.  Continue current medications.    No other concerns at this time.   Past Medical History:  Diagnosis Date   Anxiety 05/09/2018   Arthritis    Aspiration pneumonia of left lower lobe (HCC) 07/04/2022   CAP (community acquired pneumonia) 07/02/2022   Cough 10/21/2021   Diabetes mellitus without complication (HCC)    Type II   Diarrhea due to drug 08/13/2023   Dyspnea 07/09/2021   Fatigue due to exposure 06/15/2022   Hypertension    Hyponatremia 10/21/2021   Sleep apnea    No Cpap   SOB (shortness of breath) 10/21/2021   Viral upper respiratory tract infection 06/15/2022    Past Surgical History:  Procedure Laterality Date   ABDOMINAL HYSTERECTOMY     APPENDECTOMY     CARDIAC CATHETERIZATION     CHOLECYSTECTOMY     DILATION AND CURETTAGE OF UTERUS     MASTECTOMY Right    REPLACEMENT TOTAL KNEE BILATERAL     RIGHT/LEFT HEART CATH AND CORONARY ANGIOGRAPHY Bilateral 07/09/2021   Procedure: RIGHT/LEFT HEART CATH AND CORONARY ANGIOGRAPHY;  Surgeon: Florencio Cara BIRCH, MD;  Location: [facility] INV[facility] CV [facility];  Service: Cardiovascular;  Laterality: Bilateral;   TOOTH EXTRACTION N/A 01/02/2022   Procedure: DENTAL RESTORATION/EXTRACTIONS;  Surgeon: Sheryle Hamilton, DMD;  Location: [facility] OR;  Service: Oral Surgery;  Laterality: N/A;    Social History   Socioeconomic History   Marital status: Widowed    Spouse name: Not on file   Number of children: Not on file   Years of education: Not on file   Highest  education level: Not on file  Occupational History   Not on file  Tobacco Use   Smoking status: Never    Passive exposure: Never   Smokeless tobacco: Never  Vaping Use   Vaping status: Never Used  Substance and Sexual Activity   Alcohol use: No   Drug use: No   Sexual activity: Not on file  Other Topics Concern   Not on file  Social History Narrative   Not on file   Social Drivers of Health   Financial Resource Strain: Not on file  Food Insecurity: No Food Insecurity (08/17/2023)   Hunger Vital Sign    Worried About Running Out of Food in the Last Year: Never true    Ran Out of Food in the Last Year: Never true  Transportation Needs: No Transportation Needs (08/17/2023)   PRAPARE - Administrator, Civil Service (Medical): No    Lack of Transportation (Non-Medical): No  Physical Activity: Not on file  Stress: Not on file  Social Connections: Moderately Isolated (08/13/2023)   Social Connection and Isolation Panel    Frequency of Communication with Friends and Family: Three times a week    Frequency of Social Gatherings with Friends and Family: Three times a week    Attends Religious Services: More than 4 times per year    Active Member of Clubs  or Organizations: No    Attends Banker Meetings: Never    Marital Status: Divorced  Catering Manager Violence: Not At Risk (08/17/2023)   Humiliation, Afraid, Rape, and Kick questionnaire    Fear of Current or Ex-Partner: No    Emotionally Abused: No    Physically Abused: No    Sexually Abused: No    History reviewed. No pertinent family history.  Allergies  Allergen Reactions   Mounjaro  [Tirzepatide ] Diarrhea   Iodine Swelling   Ivp Dye [Iodinated Contrast Media] Swelling   Sulfa Antibiotics Swelling    Outpatient Medications Prior to Visit  Medication Sig   albuterol  (VENTOLIN  HFA) 108 (90 Base) MCG/ACT inhaler TAKE 2 PUFFS BY MOUTH EVERY 6 HOURS AS NEEDED FOR WHEEZE OR SHORTNESS OF BREATH    ALPRAZolam  (XANAX  XR) 0.5 MG 24 hr tablet Take 0.5 mg by mouth in the morning and at bedtime.   amLODipine  (NORVASC ) 2.5 MG tablet TAKE 1 TABLET BY MOUTH DAILY FOR ELEVATED BLOOD PRESSURE   aspirin  81 MG EC tablet Take 81 mg by mouth at bedtime.   Cholecalciferol  (VITAMIN D3) 125 MCG (5000 UT) CAPS Take 1 capsule (5,000 Units total) by mouth daily.   ciprofloxacin  (CILOXAN ) 0.3 % ophthalmic solution Place 2 drops into the right eye every 4 (four) hours while awake.   clopidogrel  (PLAVIX ) 75 MG tablet TAKE 1 TABLET BY MOUTH EVERY DAY   CREON  24000-76000 units CPEP TAKE 1 CAPSULE (24,000 UNITS TOTAL) BY MOUTH 3 (THREE) TIMES DAILY BEFORE MEALS FOR 14 DAYS.   escitalopram  (LEXAPRO ) 20 MG tablet Take 1 tablet (20 mg total) by mouth daily.   fenofibrate  (TRICOR ) 145 MG tablet TAKE 1 TABLET (145 MG TOTAL) BY MOUTH AT BEDTIME   fluticasone  (FLONASE ) 50 MCG/ACT nasal spray Place 1 spray into both nostrils daily.   furosemide  (LASIX ) 40 MG tablet Take 1 tablet (40 mg total) by mouth daily.   levocetirizine (XYZAL ) 5 MG tablet TAKE 1 TABLET BY MOUTH EVERY DAY IN THE EVENING   losartan  (COZAAR ) 100 MG tablet TAKE 1 TABLET BY MOUTH EVERY DAY   montelukast  (SINGULAIR ) 10 MG tablet Take 1 tablet (10 mg total) by mouth at bedtime.   Multiple Vitamin (MULTIVITAMIN) capsule Take 2 capsules by mouth daily.   nitroGLYCERIN  (NITROSTAT ) 0.4 MG SL tablet Place 0.4 mg under the tongue every 5 (five) minutes as needed for chest pain.   omega-3 acid ethyl esters (LOVAZA ) 1 g capsule TAKE 2 CAPSULES BY MOUTH TWICE A DAY   omeprazole  (PRILOSEC) 40 MG capsule Take 1 capsule (40 mg total) by mouth daily.   oxyCODONE  (OXYCONTIN ) 10 mg 12 hr tablet Take 20 mg by mouth every 12 (twelve) hours.   predniSONE  (DELTASONE ) 5 MG tablet TAKE 1 TABLET (5 MG TOTAL) BY MOUTH 2 (TWO) TIMES DAILY WITH A MEAL. (Patient taking differently: Take 5 mg by mouth as needed.)   predniSONE  (DELTASONE ) 50 MG tablet Take one tablet 13 hours, 7 hours  and 1 hour prior to study   sitaGLIPtin  (JANUVIA ) 100 MG tablet Take 1 tablet (100 mg total) by mouth daily.   No facility-administered medications prior to visit.    Review of Systems  Constitutional: Negative.   HENT: Negative.    Eyes: Negative.   Respiratory: Negative.  Negative for shortness of breath.   Cardiovascular: Negative.  Negative for chest pain.  Gastrointestinal: Negative.  Negative for abdominal pain, constipation and diarrhea.  Genitourinary: Negative.   Musculoskeletal:  Negative for joint pain and  myalgias.  Skin: Negative.   Neurological: Negative.  Negative for dizziness and headaches.  Endo/Heme/Allergies: Negative.   All other systems reviewed and are negative.      Objective:   BP 138/76   Pulse 84   Ht 5' (1.524 m)   Wt 176 lb 6.4 oz (80 kg)   SpO2 94%   BMI 34.45 kg/m   Vitals:   03/13/24 1421  BP: 138/76  Pulse: 84  Height: 5' (1.524 m)  Weight: 176 lb 6.4 oz (80 kg)  SpO2: 94%  BMI (Calculated): 34.45    Physical Exam Vitals and nursing note reviewed.  Constitutional:      Appearance: Normal appearance. She is normal weight.  HENT:     Head: Normocephalic and atraumatic.     Nose: Nose normal.     Mouth/Throat:     Mouth: Mucous membranes are moist.  Eyes:     Extraocular Movements: Extraocular movements intact.     Conjunctiva/sclera: Conjunctivae normal.     Pupils: Pupils are equal, round, and reactive to light.  Cardiovascular:     Rate and Rhythm: Normal rate and regular rhythm.     Pulses: Normal pulses.     Heart sounds: Normal heart sounds.  Pulmonary:     Effort: Pulmonary effort is normal.     Breath sounds: Normal breath sounds.  Abdominal:     General: Abdomen is flat. Bowel sounds are normal.     Palpations: Abdomen is soft.  Musculoskeletal:        General: Normal range of motion.     Cervical back: Normal range of motion.  Skin:    General: Skin is warm and dry.  Neurological:     General: No focal  deficit present.     Mental Status: She is alert and oriented to person, place, and time.  Psychiatric:        Mood and Affect: Mood normal.        Behavior: Behavior normal.        Thought Content: Thought content normal.        Judgment: Judgment normal.      No results found for any visits on 03/13/24.  No results found for this or any previous visit (from the past 2160 hours).    Assessment & Plan:  Continue current medications  Problem List Items Addressed This Visit       Cardiovascular and Mediastinum   Benign essential hypertension - Primary     Endocrine   Type 2 diabetes mellitus with hyperglycemia (HCC)     Genitourinary   CKD stage 3b, GFR 30-44 ml/min (HCC)     Other   Risk for falls   Dyslipidemia    Return if symptoms worsen or fail to improve, for as scheduled.   Total time spent: 25 minutes. This time includes review of previous notes and results and patient face to face interaction during today's visit.    Jeoffrey Pollen, NP  03/13/2024   This document may have been prepared by Dragon Voice Recognition software and as such may include unintentional dictation errors.

## 2024-03-17 ENCOUNTER — Ambulatory Visit: Admitting: Cardiology

## 2024-03-20 ENCOUNTER — Other Ambulatory Visit

## 2024-03-20 DIAGNOSIS — I10 Essential (primary) hypertension: Secondary | ICD-10-CM

## 2024-03-20 DIAGNOSIS — Z1329 Encounter for screening for other suspected endocrine disorder: Secondary | ICD-10-CM

## 2024-03-20 DIAGNOSIS — E1165 Type 2 diabetes mellitus with hyperglycemia: Secondary | ICD-10-CM

## 2024-03-20 DIAGNOSIS — E785 Hyperlipidemia, unspecified: Secondary | ICD-10-CM

## 2024-03-21 ENCOUNTER — Ambulatory Visit: Payer: Self-pay | Admitting: Cardiology

## 2024-03-21 LAB — CMP14+EGFR
ALT: 14 IU/L (ref 0–32)
AST: 22 IU/L (ref 0–40)
Albumin: 4.6 g/dL (ref 3.7–4.7)
Alkaline Phosphatase: 67 IU/L (ref 48–129)
BUN/Creatinine Ratio: 10 — ABNORMAL LOW (ref 12–28)
BUN: 11 mg/dL (ref 8–27)
Bilirubin Total: 0.4 mg/dL (ref 0.0–1.2)
CO2: 22 mmol/L (ref 20–29)
Calcium: 9.9 mg/dL (ref 8.7–10.3)
Chloride: 94 mmol/L — ABNORMAL LOW (ref 96–106)
Creatinine, Ser: 1.06 mg/dL — ABNORMAL HIGH (ref 0.57–1.00)
Globulin, Total: 2.1 g/dL (ref 1.5–4.5)
Glucose: 112 mg/dL — ABNORMAL HIGH (ref 70–99)
Potassium: 4.2 mmol/L (ref 3.5–5.2)
Sodium: 133 mmol/L — ABNORMAL LOW (ref 134–144)
Total Protein: 6.7 g/dL (ref 6.0–8.5)
eGFR: 51 mL/min/1.73 — ABNORMAL LOW (ref >59)

## 2024-03-21 LAB — HEMOGLOBIN A1C
Est. average glucose Bld gHb Est-mCnc: 111 mg/dL
Hgb A1c MFr Bld: 5.5 % (ref 4.8–5.6)

## 2024-03-21 LAB — LIPID PANEL
Chol/HDL Ratio: 2.3 ratio (ref 0.0–4.4)
Cholesterol, Total: 140 mg/dL (ref 100–199)
HDL: 62 mg/dL (ref >39)
LDL Chol Calc (NIH): 61 mg/dL (ref 0–99)
Triglycerides: 89 mg/dL (ref 0–149)
VLDL Cholesterol Cal: 17 mg/dL (ref 5–40)

## 2024-03-21 LAB — TSH: TSH: 0.541 u[IU]/mL (ref 0.450–4.500)

## 2024-03-27 ENCOUNTER — Ambulatory Visit: Admitting: Cardiology

## 2024-03-29 ENCOUNTER — Other Ambulatory Visit: Payer: Self-pay | Admitting: Cardiology

## 2024-03-31 ENCOUNTER — Ambulatory Visit: Admitting: Cardiology

## 2024-04-04 ENCOUNTER — Ambulatory Visit: Admitting: Cardiology

## 2024-04-07 ENCOUNTER — Ambulatory Visit: Admitting: Cardiology

## 2024-04-07 ENCOUNTER — Encounter: Payer: Self-pay | Admitting: Cardiology

## 2024-04-07 VITALS — BP 116/68 | HR 82 | Ht 60.0 in | Wt 171.4 lb

## 2024-04-07 DIAGNOSIS — N1832 Chronic kidney disease, stage 3b: Secondary | ICD-10-CM

## 2024-04-07 DIAGNOSIS — R413 Other amnesia: Secondary | ICD-10-CM | POA: Diagnosis not present

## 2024-04-07 DIAGNOSIS — E782 Mixed hyperlipidemia: Secondary | ICD-10-CM

## 2024-04-07 DIAGNOSIS — F32A Depression, unspecified: Secondary | ICD-10-CM | POA: Diagnosis not present

## 2024-04-07 DIAGNOSIS — E1165 Type 2 diabetes mellitus with hyperglycemia: Secondary | ICD-10-CM

## 2024-04-07 DIAGNOSIS — E1159 Type 2 diabetes mellitus with other circulatory complications: Secondary | ICD-10-CM

## 2024-04-07 DIAGNOSIS — I152 Hypertension secondary to endocrine disorders: Secondary | ICD-10-CM | POA: Diagnosis not present

## 2024-04-07 DIAGNOSIS — F419 Anxiety disorder, unspecified: Secondary | ICD-10-CM | POA: Diagnosis not present

## 2024-04-07 DIAGNOSIS — E1169 Type 2 diabetes mellitus with other specified complication: Secondary | ICD-10-CM | POA: Diagnosis not present

## 2024-04-07 NOTE — Progress Notes (Unsigned)
 Established Patient Office Visit  Subjective:  Patient ID: Hayley Knight, female    DOB: 19-Aug-1935  Age: 88 y.o. MRN: [id number]  Chief Complaint  Patient presents with   Follow-up    2 month follow up    Patient in office for 2 month follow up, discuss recent lab results.  Referral to neuro, neck pain, memory    No other concerns at this time.   Past Medical History:  Diagnosis Date   Anxiety 05/09/2018   Arthritis    Aspiration pneumonia of left lower lobe (HCC) 07/04/2022   CAP (community acquired pneumonia) 07/02/2022   Cough 10/21/2021   Diabetes mellitus without complication (HCC)    Type II   Diarrhea due to drug 08/13/2023   Dyspnea 07/09/2021   Fatigue due to exposure 06/15/2022   Hypertension    Hyponatremia 10/21/2021   Sleep apnea    No Cpap   SOB (shortness of breath) 10/21/2021   Viral upper respiratory tract infection 06/15/2022    Past Surgical History:  Procedure Laterality Date   ABDOMINAL HYSTERECTOMY     APPENDECTOMY     CARDIAC CATHETERIZATION     CHOLECYSTECTOMY     DILATION AND CURETTAGE OF UTERUS     MASTECTOMY Right    REPLACEMENT TOTAL KNEE BILATERAL     RIGHT/LEFT HEART CATH AND CORONARY ANGIOGRAPHY Bilateral 07/09/2021   Procedure: RIGHT/LEFT HEART CATH AND CORONARY ANGIOGRAPHY;  Surgeon: Florencio Cara BIRCH, MD;  Location: [facility];  Service: Cardiovascular;  Laterality: Bilateral;   TOOTH EXTRACTION N/A 01/02/2022   Procedure: DENTAL RESTORATION/EXTRACTIONS;  Surgeon: Sheryle Hamilton, DMD;  Location: [facility] OR;  Service: Oral Surgery;  Laterality: N/A;    Social History   Socioeconomic History   Marital status: Widowed    Spouse name: Not on file   Number of children: Not on file   Years of education: Not on file   Highest education level: Not on file  Occupational History   Not on file  Tobacco Use   Smoking status: Never    Passive exposure: Never   Smokeless tobacco: Never  Vaping Use   Vaping status: Never  Used  Substance and Sexual Activity   Alcohol use: No   Drug use: No   Sexual activity: Not on file  Other Topics Concern   Not on file  Social History Narrative   Not on file   Social Drivers of Health   Tobacco Use: Low Risk (03/13/2024)   Patient History    Smoking Tobacco Use: Never    Smokeless Tobacco Use: Never    Passive Exposure: Never  Financial Resource Strain: Not on file  Food Insecurity: No Food Insecurity (08/17/2023)   Hunger Vital Sign    Worried About Running Out of Food in the Last Year: Never true    Ran Out of Food in the Last Year: Never true  Transportation Needs: No Transportation Needs (08/17/2023)   PRAPARE - Administrator, Civil Service (Medical): No    Lack of Transportation (Non-Medical): No  Physical Activity: Not on file  Stress: Not on file  Social Connections: Moderately Isolated (08/13/2023)   Social Connection and Isolation Panel    Frequency of Communication with Friends and Family: Three times a week    Frequency of Social Gatherings with Friends and Family: Three times a week    Attends Religious Services: More than 4 times per year    Active Member of Clubs or Organizations: No  Attends Banker Meetings: Never    Marital Status: Divorced  Catering Manager Violence: Not At Risk (08/17/2023)   Humiliation, Afraid, Rape, and Kick questionnaire    Fear of Current or Ex-Partner: No    Emotionally Abused: No    Physically Abused: No    Sexually Abused: No  Depression (PHQ2-9): Low Risk (03/08/2023)   Depression (PHQ2-9)    PHQ-2 Score: 0  Alcohol Screen: Not on file  Housing: Low Risk (08/17/2023)   Housing Stability Vital Sign    Unable to Pay for Housing in the Last Year: No    Number of Times Moved in the Last Year: 0    Homeless in the Last Year: No  Utilities: Not At Risk (08/17/2023)   [employer] Utilities    Threatened with loss of utilities: No  Health Literacy: Not on file    No family history on  file.  Allergies[1]  Show/hide medication list[2]  Review of Systems  Constitutional: Negative.   HENT: Negative.    Eyes: Negative.   Respiratory: Negative.  Negative for shortness of breath.   Cardiovascular: Negative.  Negative for chest pain.  Gastrointestinal: Negative.  Negative for abdominal pain, constipation and diarrhea.  Genitourinary: Negative.   Musculoskeletal:  Negative for joint pain and myalgias.  Skin: Negative.   Neurological: Negative.  Negative for dizziness and headaches.  Endo/Heme/Allergies: Negative.   Psychiatric/Behavioral:  Positive for depression and memory loss. The patient is nervous/anxious.   All other systems reviewed and are negative.      Objective:   BP 116/68   Pulse 82   Ht 5' (1.524 m)   Wt 171 lb 6.4 oz (77.7 kg)   SpO2 97%   BMI 33.47 kg/m   Vitals:   04/07/24 1409  BP: 116/68  Pulse: 82  Height: 5' (1.524 m)  Weight: 171 lb 6.4 oz (77.7 kg)  SpO2: 97%  BMI (Calculated): 33.47    Physical Exam Vitals and nursing note reviewed.  Constitutional:      Appearance: Normal appearance. She is normal weight.  HENT:     Head: Normocephalic and atraumatic.     Nose: Nose normal.     Mouth/Throat:     Mouth: Mucous membranes are moist.  Eyes:     Extraocular Movements: Extraocular movements intact.     Conjunctiva/sclera: Conjunctivae normal.     Pupils: Pupils are equal, round, and reactive to light.  Cardiovascular:     Rate and Rhythm: Normal rate and regular rhythm.     Pulses: Normal pulses.     Heart sounds: Normal heart sounds.  Pulmonary:     Effort: Pulmonary effort is normal.     Breath sounds: Normal breath sounds.  Abdominal:     General: Abdomen is flat. Bowel sounds are normal.     Palpations: Abdomen is soft.  Musculoskeletal:        General: Normal range of motion.     Cervical back: Normal range of motion.  Skin:    General: Skin is warm and dry.  Neurological:     General: No focal deficit  present.     Mental Status: She is alert and oriented to person, place, and time.  Psychiatric:        Mood and Affect: Mood normal.        Behavior: Behavior normal.        Thought Content: Thought content normal.        Judgment: Judgment normal.  No results found for any visits on 04/07/24.  Recent Results (from the past 2160 hours)  TSH     Status: None   Collection Time: 03/20/24  3:17 PM  Result Value Ref Range   TSH 0.541 0.450 - 4.500 uIU/mL  Hemoglobin A1c     Status: None   Collection Time: 03/20/24  3:17 PM  Result Value Ref Range   Hgb A1c MFr Bld 5.5 4.8 - 5.6 %    Comment:          Prediabetes: 5.7 - 6.4          Diabetes: >6.4          Glycemic control for adults with diabetes: <7.0    Est. average glucose Bld gHb Est-mCnc 111 mg/dL  Lipid Profile     Status: None   Collection Time: 03/20/24  3:17 PM  Result Value Ref Range   Cholesterol, Total 140 100 - 199 mg/dL   Triglycerides 89 0 - 149 mg/dL   HDL 62 >60 mg/dL   VLDL Cholesterol Cal 17 5 - 40 mg/dL   LDL Chol Calc (NIH) 61 0 - 99 mg/dL   Chol/HDL Ratio 2.3 0.0 - 4.4 ratio    Comment:                                   T. Chol/HDL Ratio                                             Men  Women                               1/2 Avg.Risk  3.4    3.3                                   Avg.Risk  5.0    4.4                                2X Avg.Risk  9.6    7.1                                3X Avg.Risk 23.4   11.0   CMP14+EGFR     Status: Abnormal   Collection Time: 03/20/24  3:17 PM  Result Value Ref Range   Glucose 112 (H) 70 - 99 mg/dL   BUN 11 8 - 27 mg/dL   Creatinine, Ser 8.93 (H) 0.57 - 1.00 mg/dL   eGFR 51 (L) >40 fO/fpw/8.26   BUN/Creatinine Ratio 10 (L) 12 - 28   Sodium 133 (L) 134 - 144 mmol/L   Potassium 4.2 3.5 - 5.2 mmol/L   Chloride 94 (L) 96 - 106 mmol/L   CO2 22 20 - 29 mmol/L   Calcium  9.9 8.7 - 10.3 mg/dL   Total Protein 6.7 6.0 - 8.5 g/dL   Albumin 4.6 3.7 - 4.7 g/dL    Globulin, Total 2.1 1.5 - 4.5 g/dL   Bilirubin Total 0.4 0.0 - 1.2 mg/dL   Alkaline Phosphatase 67 48 -  129 IU/L   AST 22 0 - 40 IU/L   ALT 14 0 - 32 IU/L      Assessment & Plan:   Problem List Items Addressed This Visit       Cardiovascular and Mediastinum   Hypertension associated with diabetes (HCC)     Endocrine   Type 2 diabetes mellitus with hyperglycemia (HCC) - Primary   Combined hyperlipidemia associated with type 2 diabetes mellitus (HCC)     Genitourinary   CKD stage 3b, GFR 30-44 ml/min (HCC)     Other   Anxiety and depression    No follow-ups on file.   Total time spent: 25 minutes. This time includes review of previous notes and results and patient face to face interaction during today's visit.    Jeoffrey Pollen, NP  04/07/2024   This document may have been prepared by [facility] Voice Recognition software and as such may include unintentional dictation errors.     [1]  Allergies Allergen Reactions   Mounjaro  [Tirzepatide ] Diarrhea   Iodine Swelling   Ivp Dye [Iodinated Contrast Media] Swelling   Sulfa Antibiotics Swelling  [2]  Outpatient Medications Prior to Visit  Medication Sig   albuterol  (VENTOLIN  HFA) 108 (90 Base) MCG/ACT inhaler TAKE 2 PUFFS BY MOUTH EVERY 6 HOURS AS NEEDED FOR WHEEZE OR SHORTNESS OF BREATH   ALPRAZolam  (XANAX  XR) 0.5 MG 24 hr tablet Take 0.5 mg by mouth in the morning and at bedtime.   amLODipine  (NORVASC ) 2.5 MG tablet TAKE 1 TABLET BY MOUTH DAILY FOR ELEVATED BLOOD PRESSURE   aspirin  81 MG EC tablet Take 81 mg by mouth at bedtime.   Cholecalciferol  (VITAMIN D3) 125 MCG (5000 UT) CAPS Take 1 capsule (5,000 Units total) by mouth daily.   ciprofloxacin  (CILOXAN ) 0.3 % ophthalmic solution Place 2 drops into the right eye every 4 (four) hours while awake.   clopidogrel  (PLAVIX ) 75 MG tablet TAKE 1 TABLET BY MOUTH EVERY DAY   CREON  24000-76000 units CPEP TAKE 1 CAPSULE (24,000 UNITS TOTAL) BY MOUTH 3 (THREE) TIMES DAILY BEFORE  MEALS FOR 14 DAYS.   escitalopram  (LEXAPRO ) 20 MG tablet Take 1 tablet (20 mg total) by mouth daily.   fenofibrate  (TRICOR ) 145 MG tablet TAKE 1 TABLET (145 MG TOTAL) BY MOUTH AT BEDTIME   fluticasone  (FLONASE ) 50 MCG/ACT nasal spray Place 1 spray into both nostrils daily.   furosemide  (LASIX ) 40 MG tablet Take 1 tablet (40 mg total) by mouth daily.   losartan  (COZAAR ) 100 MG tablet TAKE 1 TABLET BY MOUTH EVERY DAY   Multiple Vitamin (MULTIVITAMIN) capsule Take 2 capsules by mouth daily.   nitroGLYCERIN  (NITROSTAT ) 0.4 MG SL tablet Place 0.4 mg under the tongue every 5 (five) minutes as needed for chest pain.   omega-3 acid ethyl esters (LOVAZA ) 1 g capsule TAKE 2 CAPSULES BY MOUTH TWICE A DAY   omeprazole  (PRILOSEC) 40 MG capsule Take 1 capsule (40 mg total) by mouth daily.   oxyCODONE  (OXYCONTIN ) 10 mg 12 hr tablet Take 20 mg by mouth every 12 (twelve) hours.   predniSONE  (DELTASONE ) 5 MG tablet TAKE 1 TABLET (5 MG TOTAL) BY MOUTH 2 (TWO) TIMES DAILY WITH A MEAL. (Patient taking differently: Take 5 mg by mouth as needed.)   predniSONE  (DELTASONE ) 50 MG tablet Take one tablet 13 hours, 7 hours and 1 hour prior to study   sitaGLIPtin  (JANUVIA ) 100 MG tablet Take 1 tablet (100 mg total) by mouth daily.   [DISCONTINUED] levocetirizine (XYZAL ) 5 MG  tablet TAKE 1 TABLET BY MOUTH EVERY DAY IN THE EVENING   [DISCONTINUED] montelukast  (SINGULAIR ) 10 MG tablet TAKE 1 TABLET BY MOUTH EVERYDAY AT BEDTIME   No facility-administered medications prior to visit.

## 2024-04-17 ENCOUNTER — Telehealth: Payer: Self-pay

## 2024-04-17 ENCOUNTER — Other Ambulatory Visit: Payer: Self-pay | Admitting: Cardiology

## 2024-04-17 MED ORDER — AMOXICILLIN-POT CLAVULANATE 875-125 MG PO TABS: 1.0000 | ORAL_TABLET | Freq: Two times a day (BID) | ORAL | 0 refills | Status: AC

## 2024-04-17 NOTE — Telephone Encounter (Signed)
 Patient called stating that she's got a cold/ allergy and is requesting a antibiotic be sent into the pharmacy, she sounded terrible on the phone

## 2024-04-17 NOTE — Telephone Encounter (Signed)
 Pt states she has a headache, sore throat, and sinuses. Pt is requesting antibiotics. Please advise.

## 2024-05-01 ENCOUNTER — Other Ambulatory Visit: Payer: Self-pay

## 2024-05-01 ENCOUNTER — Other Ambulatory Visit: Payer: Self-pay | Admitting: Cardiology

## 2024-05-01 ENCOUNTER — Telehealth: Payer: Self-pay

## 2024-05-01 MED ORDER — AMOXICILLIN-POT CLAVULANATE 875-125 MG PO TABS: 1.0000 | ORAL_TABLET | Freq: Two times a day (BID) | ORAL | 0 refills | Status: AC

## 2024-05-01 MED ORDER — FENOFIBRATE 145 MG PO TABS: 145.0000 mg | ORAL_TABLET | Freq: Every day | ORAL | 1 refills | Status: AC

## 2024-05-01 MED ORDER — PREDNISONE 5 MG PO TABS: 5.0000 mg | ORAL_TABLET | Freq: Two times a day (BID) | ORAL | 0 refills | Status: AC

## 2024-05-01 NOTE — Telephone Encounter (Signed)
 Patient called stating that she's got a sinus infection, she's got some pressure in her face and forehead and a headache, she's asking for a abx to be called in for 10 days she states that 7 days just doesn't cut it she needs the extra days worth

## 2024-05-04 ENCOUNTER — Other Ambulatory Visit: Payer: Self-pay

## 2024-05-04 ENCOUNTER — Ambulatory Visit: Admitting: Gastroenterology

## 2024-05-08 ENCOUNTER — Other Ambulatory Visit: Payer: Self-pay

## 2024-05-08 DIAGNOSIS — E1165 Type 2 diabetes mellitus with hyperglycemia: Secondary | ICD-10-CM

## 2024-05-08 MED ORDER — CIPROFLOXACIN HCL 0.3 % OP SOLN: 2.0000 [drp] | OPHTHALMIC | 0 refills | Status: AC

## 2024-05-08 MED ORDER — SITAGLIPTIN PHOSPHATE 100 MG PO TABS: 100.0000 mg | ORAL_TABLET | Freq: Every day | ORAL | 3 refills | Status: DC

## 2024-05-09 ENCOUNTER — Other Ambulatory Visit: Payer: Self-pay

## 2024-05-09 DIAGNOSIS — E1165 Type 2 diabetes mellitus with hyperglycemia: Secondary | ICD-10-CM

## 2024-05-09 MED ORDER — SITAGLIPTIN PHOSPHATE 100 MG PO TABS: 100.0000 mg | ORAL_TABLET | Freq: Every day | ORAL | 3 refills | Status: AC

## 2024-05-30 ENCOUNTER — Other Ambulatory Visit: Payer: Self-pay

## 2024-05-30 MED ORDER — VITAMIN D (ERGOCALCIFEROL) 1.25 MG (50000 UNIT) PO CAPS: 50000.0000 [IU] | ORAL_CAPSULE | ORAL | 3 refills | Status: AC

## 2024-06-15 ENCOUNTER — Ambulatory Visit: Admitting: Gastroenterology

## 2024-08-11 ENCOUNTER — Ambulatory Visit: Admitting: Cardiology

## 2024-09-11 ENCOUNTER — Ambulatory Visit: Payer: Self-pay | Admitting: Neurology
